# Patient Record
Sex: Female | Born: 1990 | Race: White | Hispanic: No | Marital: Married | Smoking: Never smoker
Health system: Southern US, Community
[De-identification: ages and names within clinical notes are randomized; demographics above are authoritative.]

## PROBLEM LIST (undated history)

## (undated) DIAGNOSIS — R51 Headache: Secondary | ICD-10-CM

## (undated) DIAGNOSIS — I471 Supraventricular tachycardia, unspecified: Secondary | ICD-10-CM

## (undated) DIAGNOSIS — R519 Headache, unspecified: Secondary | ICD-10-CM

## (undated) DIAGNOSIS — F419 Anxiety disorder, unspecified: Secondary | ICD-10-CM

## (undated) DIAGNOSIS — R7989 Other specified abnormal findings of blood chemistry: Secondary | ICD-10-CM

## (undated) DIAGNOSIS — D1800 Hemangioma unspecified site: Secondary | ICD-10-CM

## (undated) DIAGNOSIS — R945 Abnormal results of liver function studies: Secondary | ICD-10-CM

## (undated) DIAGNOSIS — E559 Vitamin D deficiency, unspecified: Secondary | ICD-10-CM

## (undated) DIAGNOSIS — G971 Other reaction to spinal and lumbar puncture: Secondary | ICD-10-CM

## (undated) DIAGNOSIS — K219 Gastro-esophageal reflux disease without esophagitis: Secondary | ICD-10-CM

## (undated) DIAGNOSIS — N83209 Unspecified ovarian cyst, unspecified side: Secondary | ICD-10-CM

## (undated) DIAGNOSIS — G709 Myoneural disorder, unspecified: Secondary | ICD-10-CM

## (undated) DIAGNOSIS — G35 Multiple sclerosis: Secondary | ICD-10-CM

## (undated) HISTORY — DX: Myoneural disorder, unspecified: G70.9

## (undated) HISTORY — DX: Unspecified ovarian cyst, unspecified side: N83.209

## (undated) HISTORY — DX: Other reaction to spinal and lumbar puncture: G97.1

## (undated) HISTORY — DX: Headache, unspecified: R51.9

## (undated) HISTORY — DX: Abnormal results of liver function studies: R94.5

## (undated) HISTORY — DX: Headache: R51

## (undated) HISTORY — DX: Gastro-esophageal reflux disease without esophagitis: K21.9

## (undated) HISTORY — DX: Vitamin D deficiency, unspecified: E55.9

## (undated) HISTORY — PX: TYMPANOSTOMY TUBE PLACEMENT: SHX32

## (undated) HISTORY — DX: Hemangioma unspecified site: D18.00

## (undated) HISTORY — DX: Other specified abnormal findings of blood chemistry: R79.89

---
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
## 2012-01-08 ENCOUNTER — Encounter: Payer: Self-pay | Admitting: Obstetrics & Gynecology

## 2012-01-08 ENCOUNTER — Ambulatory Visit (INDEPENDENT_AMBULATORY_CARE_PROVIDER_SITE_OTHER): Payer: No Typology Code available for payment source | Admitting: Obstetrics & Gynecology

## 2012-01-08 VITALS — BP 133/86 | HR 93 | Temp 98.5°F | Ht <= 58 in | Wt 98.3 lb

## 2012-01-08 DIAGNOSIS — Z30432 Encounter for removal of intrauterine contraceptive device: Secondary | ICD-10-CM

## 2012-01-08 DIAGNOSIS — Z3009 Encounter for other general counseling and advice on contraception: Secondary | ICD-10-CM

## 2012-01-08 NOTE — Patient Instructions (Signed)
Contraception Choices  Contraception (birth control) is the use of any methods or devices to prevent pregnancy. Below are some methods to help avoid pregnancy.  HORMONAL METHODS   · Contraceptive implant. This is a thin, plastic tube containing progesterone hormone. It does not contain estrogen hormone. Your caregiver inserts the tube in the inner part of the upper arm. The tube can remain in place for up to 3 years. After 3 years, the implant must be removed. The implant prevents the ovaries from releasing an egg (ovulation), thickens the cervical mucus which prevents sperm from entering the uterus, and thins the lining of the inside of the uterus.  · Progesterone-only injections. These injections are given every 3 months by your caregiver to prevent pregnancy. This synthetic progesterone hormone stops the ovaries from releasing eggs. It also thickens cervical mucus and changes the uterine lining. This makes it harder for sperm to survive in the uterus.  · Birth control pills. These pills contain estrogen and progesterone hormone. They work by stopping the egg from forming in the ovary (ovulation). Birth control pills are prescribed by a caregiver. Birth control pills can also be used to treat heavy periods.  · Minipill. This type of birth control pill contains only the progesterone hormone. They are taken every day of each month and must be prescribed by your caregiver.  · Birth control patch. The patch contains hormones similar to those in birth control pills. It must be changed once a week and is prescribed by a caregiver.  · Vaginal ring. The ring contains hormones similar to those in birth control pills. It is left in the vagina for 3 weeks, removed for 1 week, and then a new one is put back in place. The patient must be comfortable inserting and removing the ring from the vagina. A caregiver's prescription is necessary.  · Emergency contraception. Emergency contraceptives prevent pregnancy after unprotected  sexual intercourse. This pill can be taken right after sex or up to 5 days after unprotected sex. It is most effective the sooner you take the pills after having sexual intercourse. Emergency contraceptive pills are available without a prescription. Check with your pharmacist. Do not use emergency contraception as your only form of birth control.  BARRIER METHODS   · Female condom. This is a thin sheath (latex or rubber) that is worn over the penis during sexual intercourse. It can be used with spermicide to increase effectiveness.  · Female condom. This is a soft, loose-fitting sheath that is put into the vagina before sexual intercourse.  · Diaphragm. This is a soft, latex, dome-shaped barrier that must be fitted by a caregiver. It is inserted into the vagina, along with a spermicidal jelly. It is inserted before intercourse. The diaphragm should be left in the vagina for 6 to 8 hours after intercourse.  · Cervical cap. This is a round, soft, latex or plastic cup that fits over the cervix and must be fitted by a caregiver. The cap can be left in place for up to 48 hours after intercourse.  · Sponge. This is a soft, circular piece of polyurethane foam. The sponge has spermicide in it. It is inserted into the vagina after wetting it and before sexual intercourse.  · Spermicides. These are chemicals that kill or block sperm from entering the cervix and uterus. They come in the form of creams, jellies, suppositories, foam, or tablets. They do not require a prescription. They are inserted into the vagina with an applicator before having sexual intercourse.   IUD). This is a T-shaped device that is put in a woman's uterus during a menstrual period to prevent pregnancy. There are 2 types:  Copper IUD. This type of IUD is wrapped in copper wire and is placed inside the uterus. Copper makes the uterus and  fallopian tubes produce a fluid that kills sperm. It can stay in place for 10 years.  Hormone IUD. This type of IUD contains the hormone progestin (synthetic progesterone). The hormone thickens the cervical mucus and prevents sperm from entering the uterus, and it also thins the uterine lining to prevent implantation of a fertilized egg. The hormone can weaken or kill the sperm that get into the uterus. It can stay in place for 5 years. PERMANENT METHODS OF CONTRACEPTION  Female tubal ligation. This is when the woman's fallopian tubes are surgically sealed, tied, or blocked to prevent the egg from traveling to the uterus.  Female sterilization. This is when the female has the tubes that carry sperm tied off (vasectomy).This blocks sperm from entering the vagina during sexual intercourse. After the procedure, the man can still ejaculate fluid (semen). NATURAL PLANNING METHODS  Natural family planning. This is not having sexual intercourse or using a barrier method (condom, diaphragm, cervical cap) on days the woman could become pregnant.  Calendar method. This is keeping track of the length of each menstrual cycle and identifying when you are fertile.  Ovulation method. This is avoiding sexual intercourse during ovulation.  Symptothermal method. This is avoiding sexual intercourse during ovulation, using a thermometer and ovulation symptoms.  Post-ovulation method. This is timing sexual intercourse after you have ovulated. Regardless of which type or method of contraception you choose, it is important that you use condoms to protect against the transmission of sexually transmitted diseases (STDs). Talk with your caregiver about which form of contraception is most appropriate for you. Document Released: 01/02/2005 Document Revised: 03/27/2011 Document Reviewed: 05/11/2010 [facility] Patient Information 2013 [place], [place]. Levonorgestrel intrauterine device (IUD) What is this  medicine? LEVONORGESTREL IUD (LEE voe nor jes trel) is a contraceptive (birth control) device. It is used to prevent pregnancy and to treat heavy bleeding that occurs during your period. It can be used for up to 5 years. This medicine may be used for other purposes; ask your health care provider or pharmacist if you have questions. What should I tell my health care provider before I take this medicine? They need to know if you have any of these conditions: -abnormal Pap smear -cancer of the breast, uterus, or cervix -diabetes -endometritis -genital or pelvic infection now or in the past -have more than one sexual partner or your partner has more than one partner -heart disease -history of an ectopic or tubal pregnancy -immune system problems -IUD in place -liver disease or tumor -problems with blood clots or take blood-thinners -use intravenous drugs -uterus of unusual shape -vaginal bleeding that has not been explained -an unusual or allergic reaction to levonorgestrel, other hormones, silicone, or polyethylene, medicines, foods, dyes, or preservatives -pregnant or trying to get pregnant -breast-feeding How should I use this medicine? This device is placed inside the uterus by a health care professional. Talk to your pediatrician regarding the use of this medicine in children. Special care may be needed. Overdosage: If you think you have taken too much of this medicine contact a poison control center or emergency room at once. NOTE: This medicine is only for you. Do not share this medicine with others. What if I miss a dose?  This does not apply. What may interact with this medicine? Do not take this medicine with any of the following medications: -amprenavir -bosentan -fosamprenavir This medicine may also interact with the following medications: -aprepitant -barbiturate medicines for inducing sleep or treating seizures -bexarotene -griseofulvin -medicines to treat seizures  like carbamazepine, ethotoin, felbamate, oxcarbazepine, phenytoin, topiramate -modafinil -pioglitazone -rifabutin -rifampin -rifapentine -some medicines to treat HIV infection like atazanavir, indinavir, lopinavir, nelfinavir, tipranavir, ritonavir -St. John's wort -warfarin This list may not describe all possible interactions. Give your health care provider a list of all the medicines, herbs, non-prescription drugs, or dietary supplements you use. Also tell them if you smoke, drink alcohol, or use illegal drugs. Some items may interact with your medicine. What should I watch for while using this medicine? Visit your doctor or health care professional for regular check ups. See your doctor if you or your partner has sexual contact with others, becomes HIV positive, or gets a sexual transmitted disease. This product does not protect you against HIV infection (AIDS) or other sexually transmitted diseases. You can check the placement of the IUD yourself by reaching up to the top of your vagina with clean fingers to feel the threads. Do not pull on the threads. It is a good habit to check placement after each menstrual period. Call your doctor right away if you feel more of the IUD than just the threads or if you cannot feel the threads at all. The IUD may come out by itself. You may become pregnant if the device comes out. If you notice that the IUD has come out use a backup birth control method like condoms and call your health care provider. Using tampons will not change the position of the IUD and are okay to use during your period. What side effects may I notice from receiving this medicine? Side effects that you should report to your doctor or health care professional as soon as possible: -allergic reactions like skin rash, itching or hives, swelling of the face, lips, or tongue -fever, flu-like symptoms -genital sores -high blood pressure -no menstrual period for 6 weeks during use -pain,  swelling, warmth in the leg -pelvic pain or tenderness -severe or sudden headache -signs of pregnancy -stomach cramping -sudden shortness of breath -trouble with balance, talking, or walking -unusual vaginal bleeding, discharge -yellowing of the eyes or skin Side effects that usually do not require medical attention (report to your doctor or health care professional if they continue or are bothersome): -acne -breast pain -change in sex drive or performance -changes in weight -cramping, dizziness, or faintness while the device is being inserted -headache -irregular menstrual bleeding within first 3 to 6 months of use -nausea This list may not describe all possible side effects. Call your doctor for medical advice about side effects. You may report side effects to FDA at 1-800-FDA-1088. Where should I keep my medicine? This does not apply. NOTE: This sheet is a summary. It may not cover all possible information. If you have questions about this medicine, talk to your doctor, pharmacist, or health care provider.  2012, [employer]. (01/24/2008 6:39:08 PM)

## 2012-01-08 NOTE — Progress Notes (Signed)
Patient ID: Amy Rivas, female   DOB: [birth year], 21 y.o.   MRN: [id number] Patient was referred for removal of her IUD as her strings were not visualized.   She reports that she is not having problems but, her husband had a vasectomy and she wants hers removed.  Patient was in the dorsal lithotomy position, normal external genitalia was noted.  A speculum was placed in the patient's vagina, normal discharge was noted, no lesions appreciated. The multiparous cervix was visualized, no lesions, no abnormal discharge were noted.  The strings of the IUD were not immediately noted.  Using a straight forceps the strings of the IUD were grasped and pulled.  The IUD was successfully removed in its entirety.  Patient tolerated the procedure well.

## 2012-01-09 ENCOUNTER — Encounter: Payer: Self-pay | Admitting: Obstetrics & Gynecology

## 2012-01-09 NOTE — Progress Notes (Signed)
This encounter was created in error - please disregard.

## 2012-01-22 ENCOUNTER — Encounter: Payer: Self-pay | Admitting: *Deleted

## 2012-09-25 ENCOUNTER — Emergency Department (HOSPITAL_COMMUNITY): Payer: BC Managed Care – PPO

## 2012-09-25 ENCOUNTER — Emergency Department (HOSPITAL_COMMUNITY)
Admission: EM | Admit: 2012-09-25 | Discharge: 2012-09-25 | Disposition: A | Discharge status: Home / Self Care | Payer: BC Managed Care – PPO | Attending: Emergency Medicine | Admitting: Emergency Medicine

## 2012-09-25 ENCOUNTER — Encounter (HOSPITAL_COMMUNITY): Payer: Self-pay | Admitting: Family Medicine

## 2012-09-25 DIAGNOSIS — Z79899 Other long term (current) drug therapy: Secondary | ICD-10-CM | POA: Insufficient documentation

## 2012-09-25 DIAGNOSIS — Z3202 Encounter for pregnancy test, result negative: Secondary | ICD-10-CM | POA: Insufficient documentation

## 2012-09-25 DIAGNOSIS — N940 Mittelschmerz: Secondary | ICD-10-CM | POA: Insufficient documentation

## 2012-09-25 DIAGNOSIS — Z791 Long term (current) use of non-steroidal anti-inflammatories (NSAID): Secondary | ICD-10-CM | POA: Insufficient documentation

## 2012-09-25 LAB — CBC WITH DIFFERENTIAL/PLATELET
Basophils Relative: 0 % (ref 0–1)
Eosinophils Absolute: 0.1 10*3/uL (ref 0.0–0.7)
Eosinophils Relative: 1 % (ref 0–5)
Lymphs Abs: 1.8 10*3/uL (ref 0.7–4.0)
MCH: 32.1 pg (ref 26.0–34.0)
MCHC: 36.2 g/dL — ABNORMAL HIGH (ref 30.0–36.0)
MCV: 88.6 fL (ref 78.0–100.0)
Platelets: 288 10*3/uL (ref 150–400)
RDW: 11.7 % (ref 11.5–15.5)

## 2012-09-25 LAB — COMPREHENSIVE METABOLIC PANEL
ALT: 9 U/L (ref 0–35)
AST: 14 U/L (ref 0–37)
Albumin: 4.9 g/dL (ref 3.5–5.2)
CO2: 25 mEq/L (ref 19–32)
Chloride: 102 mEq/L (ref 96–112)
GFR calc non Af Amer: >90 mL/min (ref >90)
Potassium: 4.4 mEq/L (ref 3.5–5.1)
Sodium: 137 mEq/L (ref 135–145)
Total Bilirubin: 0.7 mg/dL (ref 0.3–1.2)

## 2012-09-25 LAB — URINALYSIS, ROUTINE W REFLEX MICROSCOPIC
Bilirubin Urine: NEGATIVE
Glucose, UA: NEGATIVE mg/dL
Hgb urine dipstick: NEGATIVE
Protein, ur: NEGATIVE mg/dL
Specific Gravity, Urine: 1.019 (ref 1.005–1.030)
Urobilinogen, UA: 0.2 mg/dL (ref 0.0–1.0)

## 2012-09-25 MED ORDER — FENTANYL CITRATE 0.05 MG/ML IJ SOLN
50.0000 ug | Freq: Once | INTRAMUSCULAR | Status: AC
  Administered 2012-09-25: 50 ug via INTRAVENOUS
  Filled 2012-09-25: qty 2

## 2012-09-25 MED ORDER — IBUPROFEN 600 MG PO TABS: 600.0000 mg | ORAL_TABLET | Freq: Four times a day (QID) | ORAL | Status: DC | PRN

## 2012-09-25 MED ORDER — SODIUM CHLORIDE 0.9 % IV BOLUS (SEPSIS)
1000.0000 mL | Freq: Once | INTRAVENOUS | Status: AC
  Administered 2012-09-25: 1000 mL via INTRAVENOUS

## 2012-09-25 MED ORDER — HYDROCODONE-ACETAMINOPHEN 5-325 MG PO TABS: 2.0000 | ORAL_TABLET | ORAL | Status: DC | PRN

## 2012-09-25 MED ORDER — PROMETHAZINE HCL 25 MG/ML IJ SOLN
12.5000 mg | INTRAMUSCULAR | Status: DC | PRN
  Administered 2012-09-25: 12.5 mg via INTRAVENOUS
  Filled 2012-09-25: qty 1

## 2012-09-25 MED ORDER — FENTANYL CITRATE 0.05 MG/ML IJ SOLN
100.0000 ug | Freq: Once | INTRAMUSCULAR | Status: AC
  Administered 2012-09-25: 100 ug via INTRAVENOUS
  Filled 2012-09-25: qty 2

## 2012-09-25 MED ORDER — PROMETHAZINE HCL 25 MG PO TABS: 25.0000 mg | ORAL_TABLET | Freq: Four times a day (QID) | ORAL | Status: DC | PRN

## 2012-09-25 MED ORDER — ONDANSETRON HCL 4 MG/2ML IJ SOLN
4.0000 mg | Freq: Once | INTRAMUSCULAR | Status: AC
  Administered 2012-09-25: 4 mg via INTRAVENOUS
  Filled 2012-09-25: qty 2

## 2012-09-25 MED ORDER — KETOROLAC TROMETHAMINE 30 MG/ML IJ SOLN
30.0000 mg | Freq: Once | INTRAMUSCULAR | Status: AC
  Administered 2012-09-25: 30 mg via INTRAVENOUS
  Filled 2012-09-25: qty 1

## 2012-09-25 MED ORDER — IOHEXOL 300 MG/ML  SOLN
80.0000 mL | Freq: Once | INTRAMUSCULAR | Status: AC | PRN
  Administered 2012-09-25: 80 mL via INTRAVENOUS

## 2012-09-25 NOTE — ED Notes (Signed)
Po fluid given to see if she can tolerate

## 2012-09-25 NOTE — ED Provider Notes (Addendum)
CSN: [id number]     Arrival date & time 09/25/12  1438 History   First MD Initiated Contact with Patient 09/25/12 1506     Chief Complaint  Patient presents with  . Abdominal Pain    HPI Patient sent from her private doctor's office for evaluation of possible appendicitis.  Patient has a 2 to three-day history of abdominal pain which started in periumbilical area and is now moves the right lower quadrant.  Patient has had associated nausea and vomiting but no fever.  Patient's last normal Mr. period was 2 weeks ago.  She has had no abnormal or missed periods. History reviewed. No pertinent past medical history. patient has had 2 C-sections and a gallbladder removal. Past Surgical History  Procedure Laterality Date  . Cosmetic surgery    . Gall bladder removed     Family History  Problem Relation Age of Onset  . Cancer Mother   . Heart disease Mother   . Diabetes Father   . Cancer Maternal Uncle   . Diabetes Maternal Grandmother   . Asthma Maternal Grandmother   . Diabetes Maternal Grandfather   . Diabetes Paternal Grandmother   . Diabetes Paternal Grandfather    History  Substance Use Topics  . Smoking status: Never Smoker   . Smokeless tobacco: Not on file  . Alcohol Use: No   OB History   Grav Para Term Preterm Abortions TAB SAB Ect Mult Living   2 2  2            Review of Systems All other systems reviewed and a Allergies  Codeine; Codeine; Morphine and related; and Morphine and related  Home Medications   Current Outpatient Rx  Name  Route  Sig  Dispense  Refill  . pantoprazole (PROTONIX) 40 MG tablet   Oral   Take 40 mg by mouth daily.         Marland Kitchen HYDROcodone-acetaminophen (NORCO/VICODIN) 5-325 MG per tablet   Oral   Take 2 tablets by mouth every 4 (four) hours as needed for pain.   10 tablet   0   . ibuprofen (ADVIL,MOTRIN) 600 MG tablet   Oral   Take 1 tablet (600 mg total) by mouth every 6 (six) hours as needed for pain.   30 tablet   0   .  promethazine (PHENERGAN) 25 MG tablet   Oral   Take 1 tablet (25 mg total) by mouth every 6 (six) hours as needed for nausea.   20 tablet   0    BP 131/86  Pulse 88  Temp(Src) 98.1 F (36.7 C)  Resp 18  SpO2 100%  LMP 09/11/2012 Physical Exam  Nursing note and vitals reviewed. Constitutional: She is oriented to person, place, and time. She appears well-developed and well-nourished. No distress.  HENT:  Head: Normocephalic and atraumatic.  Eyes: Pupils are equal, round, and reactive to light.  Neck: Normal range of motion.  Cardiovascular: Normal rate and intact distal pulses.   Pulmonary/Chest: No respiratory distress.  Abdominal: Normal appearance and bowel sounds are normal. She exhibits no distension. There is tenderness in the right lower quadrant. There is guarding. There is no rebound.    Musculoskeletal: Normal range of motion.  Neurological: She is alert and oriented to person, place, and time. No cranial nerve deficit.  Skin: Skin is warm and dry. No rash noted.  Psychiatric: She has a normal mood and affect. Her behavior is normal.    ED Course  Procedures (including  critical care time) Medications  promethazine (PHENERGAN) injection 12.5 mg (12.5 mg Intravenous Given 09/25/12 1838)  fentaNYL (SUBLIMAZE) injection 100 mcg (100 mcg Intravenous Given 09/25/12 1550)  sodium chloride 0.9 % bolus 1,000 mL (0 mLs Intravenous Stopped 09/25/12 1853)  ondansetron (ZOFRAN) injection 4 mg (4 mg Intravenous Given 09/25/12 1548)  iohexol (OMNIPAQUE) 300 MG/ML solution 80 mL (80 mLs Intravenous Contrast Given 09/25/12 1649)  ketorolac (TORADOL) 30 MG/ML injection 30 mg (30 mg Intravenous Given 09/25/12 1838)  fentaNYL (SUBLIMAZE) injection 50 mcg (50 mcg Intravenous Given 09/25/12 1837)    Labs Review Labs Reviewed  CBC WITH DIFFERENTIAL - Abnormal; Notable for the following:    Hemoglobin 16.3 (*)    MCHC 36.2 (*)    All other components within normal limits  URINALYSIS,  ROUTINE W REFLEX MICROSCOPIC - Abnormal; Notable for the following:    Ketones, ur 15 (*)    All other components within normal limits  COMPREHENSIVE METABOLIC PANEL  POCT PREGNANCY, URINE   Imaging Review Ct Abdomen Pelvis W Contrast  09/25/2012   CLINICAL DATA:  Right-side abdominal pain associated with nausea and vomiting question appendicitis  EXAM: CT ABDOMEN AND PELVIS WITH CONTRAST  TECHNIQUE: Multidetector CT imaging of the abdomen and pelvis was performed using the standard protocol following bolus administration of intravenous contrast. Sagittal and coronal MPR images reconstructed from axial data set.  CONTRAST:  80mL OMNIPAQUE IOHEXOL 300 MG/ML SOLN No oral contrast administered.  COMPARISON:  None  FINDINGS: Lung bases clear.  Gallbladder surgically absent.  Multiple low-attenuation foci within liver, nonspecific attenuation measurements, largest right lobe 12 x 9 mm.  Minimal focal fatty infiltration of liver adjacent to falciform fissure.  Liver, spleen, pancreas, kidneys, and adrenal glands otherwise normal appearance.  Questionable visualization of the appendix in the right pelvis.  No pericecal inflammatory changes.  Small left ovarian cyst 2.4 x 2.0 cm.  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  Small amount nonspecific low-attenuation free pelvic fluid.  Stomach and bowel loops normal appearance.  No mass, adenopathy, or free air.  Bones unremarkable.  IMPRESSION: Small amount of nonspecific free pelvic fluid.  Small left ovarian cysts.  No other acute intra-abdominal or intrapelvic abnormalities.  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  Nonspecific low-attenuation foci within liver largest 12 mm diameter, statistically most likely benign.   Electronically Signed   By: Ulyses Southward M.D.   On: 09/25/2012 17:15    MDM   1. [place]      General surgery Dr. Lindie Spruce will review films and CT scans.  After treatment in the ED  the patient feels back to baseline and wants to go home.  Nelia Shi, MD 09/25/12 [phone]

## 2012-09-25 NOTE — ED Notes (Signed)
Per pt sts right sided abdominal pain associated with N,V. Sent here by doctor to r/o appendicitis, denies urinary symptoms. Denies vaginal bleeding or discharge.

## 2012-09-25 NOTE — ED Notes (Signed)
Pt c/o rt lower quadrant pain since Monday.  The pain started in her mid-abd no previous history.  lmp 2 weeks ago.  Female at her bedside

## 2012-09-25 NOTE — ED Notes (Signed)
Pain and nausea med given again

## 2012-09-25 NOTE — ED Notes (Signed)
Sl nausea 

## 2012-09-25 NOTE — ED Notes (Signed)
To c-t.  Pain  better

## 2012-09-25 NOTE — ED Notes (Signed)
1000 cc nss added tko

## 2012-10-31 ENCOUNTER — Other Ambulatory Visit: Payer: Self-pay | Admitting: Obstetrics and Gynecology

## 2012-10-31 DIAGNOSIS — N6452 Nipple discharge: Secondary | ICD-10-CM

## 2012-10-31 DIAGNOSIS — N63 Unspecified lump in unspecified breast: Secondary | ICD-10-CM

## 2012-11-04 ENCOUNTER — Ambulatory Visit
Admission: RE | Admit: 2012-11-04 | Discharge: 2012-11-04 | Disposition: A | Discharge status: Home / Self Care | Payer: BC Managed Care – PPO | Source: Ambulatory Visit | Attending: Obstetrics and Gynecology | Admitting: Obstetrics and Gynecology

## 2012-11-04 DIAGNOSIS — N6452 Nipple discharge: Secondary | ICD-10-CM

## 2012-11-04 DIAGNOSIS — N63 Unspecified lump in unspecified breast: Secondary | ICD-10-CM

## 2012-11-21 ENCOUNTER — Encounter (INDEPENDENT_AMBULATORY_CARE_PROVIDER_SITE_OTHER): Payer: Self-pay

## 2012-11-21 ENCOUNTER — Ambulatory Visit (INDEPENDENT_AMBULATORY_CARE_PROVIDER_SITE_OTHER): Payer: BC Managed Care – PPO | Admitting: Surgery

## 2012-11-21 ENCOUNTER — Encounter (INDEPENDENT_AMBULATORY_CARE_PROVIDER_SITE_OTHER): Payer: Self-pay | Admitting: Surgery

## 2012-11-21 VITALS — BP 120/80 | HR 60 | Temp 96.5°F | Resp 18 | Ht <= 58 in | Wt 103.0 lb

## 2012-11-21 DIAGNOSIS — N6459 Other signs and symptoms in breast: Secondary | ICD-10-CM

## 2012-11-21 DIAGNOSIS — N6452 Nipple discharge: Secondary | ICD-10-CM | POA: Insufficient documentation

## 2012-11-21 NOTE — Patient Instructions (Signed)
Breast Tenderness Breast tenderness is a common problem for women of all ages. Breast tenderness may cause mild discomfort to severe pain. It has a variety of causes. Your health care provider will find out the likely cause of your breast tenderness by examining your breasts, asking you about symptoms, and ordering some tests. Breast tenderness usually does not mean you have breast cancer. HOME CARE INSTRUCTIONS  Breast tenderness often can be handled at home. You can try:  Getting fitted for a new bra that provides more support, especially during exercise.  Wearing a more supportive bra or sports bra while sleeping when your breasts are very tender.  If you have a breast injury, apply ice to the area:  Put ice in a plastic bag.  Place a towel between your skin and the bag.  Leave the ice on for 20 minutes, 2 3 times a day.  If your breasts are too full of milk as a result of breastfeeding, try:  Expressing milk either by hand or with a breast pump.  Applying a warm compress to the breasts for relief.  Taking over-the-counter pain relievers, if approved by your health care provider.  Taking other medicines that your health care provider prescribes. These may include antibiotic medicines or birth control pills. Over the long term, your breast tenderness might be eased if you:  Cut down on caffeine.  Reduce the amount of fat in your diet. Keep a log of the days and times when your breasts are most tender. This will help you and your health care provider find the cause of the tenderness and how to relieve it. Also, learn how to do breast exams at home. This will help you notice if you have an unusual growth or lump that could cause tenderness. SEEK MEDICAL CARE IF:   Any part of your breast is hard, red, and hot to the touch. This could be a sign of infection.  Fluid is coming out of your nipples (and you are not breastfeeding). Especially watch for blood or pus.  You have a fever  as well as breast tenderness.  You have a new or painful lump in your breast that remains after your menstrual period ends.  You have tried to take care of the pain at home, but it has not gone away.  Your breast pain is getting worse, or the pain is making it hard to do the things you usually do during your day. Document Released: 12/16/2007 Document Revised: 09/04/2012 Document Reviewed: 08/01/2012 [employer] Patient Information 2014 [employer].  

## 2012-11-21 NOTE — Progress Notes (Signed)
General Surgery [facility]  Chief Complaint  Patient presents with  . Breast Discharge    left breast nipple discharge - referral from Henreitta Leber, PA-C, [facility]    HISTORY: Patient is a 22 year old female referred by her gynecologist for evaluation of a left breast nipple discharge. Onset was approximately 2 months ago. Patient noted this during the daytime. Fluid was red in color and informed a less than dime sized area on her bra. She did not note any discharge at night. The red discharge has now spontaneously stopped. Patient felt as though there was a mass in the inferior lateral portion of the breast.  Patient was evaluated by her gynecologist and referred for ultrasound. She underwent an ultrasound examination of the left breast on 11/04/2012. No architectural abnormalities were identified. No suspicious findings were identified. No dilated ducts were identified.  Patient denies significant intake of caffeine, saturated fats, or chocolate. There is a family history of breast cancer at a young age in her maternal grandmother. No breast cancer in first degree relatives. No prior breast surgery or biopsies.  Past Medical History  Diagnosis Date  . GERD (gastroesophageal reflux disease)   . Hypertension     Current Outpatient Prescriptions  Medication Sig Dispense Refill  . clotrimazole (LOTRIMIN) 1 % cream Apply 1 application topically 2 (two) times daily.      . pantoprazole (PROTONIX) 40 MG tablet Take 40 mg by mouth daily.      Marland Kitchen trimethoprim-polymyxin b (POLYTRIM) ophthalmic solution every 4 (four) hours.       No current facility-administered medications for this visit.    Allergies  Allergen Reactions  . Codeine Nausea And Vomiting  . Hydrocodone Nausea And Vomiting  . Lortab [Hydrocodone-Acetaminophen] Nausea And Vomiting  . Morphine And Related Swelling and Rash    Family History  Problem Relation Age of Onset  . Cancer  Mother   . Heart disease Mother   . Diabetes Father   . Cancer Maternal Uncle   . Diabetes Maternal Grandmother   . Asthma Maternal Grandmother   . Diabetes Maternal Grandfather   . Diabetes Paternal Grandmother   . Diabetes Paternal Grandfather   . Breast cancer Maternal Grandmother     History   Social History  . Marital Status: Married    Spouse Name: N/A    Number of Children: N/A  . Years of Education: N/A   Social History Main Topics  . Smoking status: Never Smoker   . Smokeless tobacco: None  . Alcohol Use: No  . Drug Use: No  . Sexual Activity: Yes    Birth Control/ Protection: IUD   Other Topics Concern  . None   Social History Narrative   ** Merged History Encounter **        REVIEW OF SYSTEMS - PERTINENT POSITIVES ONLY: Initial nipple discharge red in color, then clear, then resolved. Breast discomfort infero-lateral left breast.  EXAM: Filed Vitals:   11/21/12 1611  BP: 120/80  Pulse: 60  Temp: 96.5 F (35.8 C)  Resp: 18    HEENT: normocephalic; pupils equal and reactive; sclerae clear; dentition good; mucous membranes moist NECK:  symmetric on extension; no palpable anterior or posterior cervical lymphadenopathy; no supraclavicular masses; no tenderness CHEST: clear to auscultation bilaterally without rales, rhonchi, or wheezes CARDIAC: regular rate and rhythm without significant murmur; peripheral pulses are full BREAST: Right breast shows normal nipple areolar complex; striae on the skin; diffusely nodular breast  parenchyma without discrete or dominant mass; axilla free of adenopathy; left breast with normal nipple areolar complex; again breast parenchyma is diffusely nodular without discrete or dominant mass; tenderness in the inferior lateral portion of the left breast; I am unable to express any significant discharge from the nipple with compression of the breast tissue today EXT:  non-tender without edema; no deformity NEURO: no gross focal  deficits; no sign of tremor   LABORATORY RESULTS: See [facility] (CHL-Epic) for most recent results  RADIOLOGY RESULTS: See [facility] (CHL-Epic) for most recent results  IMPRESSION: History of left breast nipple discharge, spontaneous, now resolved  PLAN: I discussed the above findings at length with the patient and her husband. I do not find any worrisome findings on physical examination or in the ultrasound report.  I have reassured the patient. I have asked her to do monthly breast self-examination. I've asked her to return in 4 months for physical examination here in our office. Certainly if the nipple discharge should recur, I have asked her to contact our office and notify me immediately.  Patient will return in 4 months for physical examination.  Velora Heckler, MD, FACS General & Endocrine Surgery [facility]  Primary Care Physician: Eber Hong, MD

## 2012-12-10 ENCOUNTER — Encounter (INDEPENDENT_AMBULATORY_CARE_PROVIDER_SITE_OTHER): Payer: Self-pay

## 2013-01-07 ENCOUNTER — Emergency Department (HOSPITAL_COMMUNITY): Payer: BC Managed Care – PPO

## 2013-01-07 ENCOUNTER — Emergency Department (HOSPITAL_COMMUNITY)
Admission: EM | Admit: 2013-01-07 | Discharge: 2013-01-07 | Disposition: A | Discharge status: Home / Self Care | Payer: BC Managed Care – PPO | Attending: Emergency Medicine | Admitting: Emergency Medicine

## 2013-01-07 ENCOUNTER — Encounter (HOSPITAL_COMMUNITY): Payer: Self-pay | Admitting: Emergency Medicine

## 2013-01-07 DIAGNOSIS — R0609 Other forms of dyspnea: Secondary | ICD-10-CM | POA: Insufficient documentation

## 2013-01-07 DIAGNOSIS — I10 Essential (primary) hypertension: Secondary | ICD-10-CM | POA: Insufficient documentation

## 2013-01-07 DIAGNOSIS — R061 Stridor: Secondary | ICD-10-CM

## 2013-01-07 DIAGNOSIS — R0989 Other specified symptoms and signs involving the circulatory and respiratory systems: Secondary | ICD-10-CM | POA: Insufficient documentation

## 2013-01-07 DIAGNOSIS — R21 Rash and other nonspecific skin eruption: Secondary | ICD-10-CM | POA: Insufficient documentation

## 2013-01-07 DIAGNOSIS — L539 Erythematous condition, unspecified: Secondary | ICD-10-CM | POA: Insufficient documentation

## 2013-01-07 DIAGNOSIS — Z79899 Other long term (current) drug therapy: Secondary | ICD-10-CM | POA: Insufficient documentation

## 2013-01-07 DIAGNOSIS — K219 Gastro-esophageal reflux disease without esophagitis: Secondary | ICD-10-CM | POA: Insufficient documentation

## 2013-01-07 DIAGNOSIS — IMO0002 Reserved for concepts with insufficient information to code with codable children: Secondary | ICD-10-CM | POA: Insufficient documentation

## 2013-01-07 DIAGNOSIS — T782XXA Anaphylactic shock, unspecified, initial encounter: Secondary | ICD-10-CM | POA: Insufficient documentation

## 2013-01-07 DIAGNOSIS — R0682 Tachypnea, not elsewhere classified: Secondary | ICD-10-CM | POA: Insufficient documentation

## 2013-01-07 DIAGNOSIS — T4995XA Adverse effect of unspecified topical agent, initial encounter: Secondary | ICD-10-CM | POA: Insufficient documentation

## 2013-01-07 DIAGNOSIS — Z9089 Acquired absence of other organs: Secondary | ICD-10-CM | POA: Insufficient documentation

## 2013-01-07 DIAGNOSIS — R61 Generalized hyperhidrosis: Secondary | ICD-10-CM | POA: Insufficient documentation

## 2013-01-07 LAB — BASIC METABOLIC PANEL
BUN: 8 mg/dL (ref 6–23)
CO2: 19 mEq/L (ref 19–32)
Calcium: 9 mg/dL (ref 8.4–10.5)
Chloride: 107 mEq/L (ref 96–112)
Creatinine, Ser: 0.6 mg/dL (ref 0.50–1.10)
Glucose, Bld: 168 mg/dL — ABNORMAL HIGH (ref 70–99)

## 2013-01-07 LAB — CBC WITH DIFFERENTIAL/PLATELET
Basophils Absolute: 0 10*3/uL (ref 0.0–0.1)
Eosinophils Relative: 0 % (ref 0–5)
HCT: 39.8 % (ref 36.0–46.0)
Hemoglobin: 14.6 g/dL (ref 12.0–15.0)
Lymphocytes Relative: 6 % — ABNORMAL LOW (ref 12–46)
MCV: 89 fL (ref 78.0–100.0)
Monocytes Absolute: 0.7 10*3/uL (ref 0.1–1.0)
Monocytes Relative: 5 % (ref 3–12)
Neutro Abs: 12.2 10*3/uL — ABNORMAL HIGH (ref 1.7–7.7)
RDW: 11.7 % (ref 11.5–15.5)
WBC: 13.7 10*3/uL — ABNORMAL HIGH (ref 4.0–10.5)

## 2013-01-07 LAB — POCT I-STAT, CHEM 8
BUN: 7 mg/dL (ref 6–23)
Calcium, Ion: 1.24 mmol/L — ABNORMAL HIGH (ref 1.12–1.23)
Creatinine, Ser: 0.7 mg/dL (ref 0.50–1.10)
Glucose, Bld: 173 mg/dL — ABNORMAL HIGH (ref 70–99)
Hemoglobin: 14.6 g/dL (ref 12.0–15.0)
Sodium: 143 mEq/L (ref 135–145)
TCO2: 20 mmol/L (ref 0–100)

## 2013-01-07 MED ORDER — PREDNISONE 20 MG PO TABS: 40.0000 mg | ORAL_TABLET | Freq: Every day | ORAL | Status: DC

## 2013-01-07 MED ORDER — EPINEPHRINE 0.3 MG/0.3ML IJ SOAJ: 0.3000 mg | Freq: Once | INTRAMUSCULAR | Status: DC | PRN

## 2013-01-07 MED ORDER — IOHEXOL 300 MG/ML  SOLN
80.0000 mL | Freq: Once | INTRAMUSCULAR | Status: AC | PRN
  Administered 2013-01-07: 80 mL via INTRAVENOUS

## 2013-01-07 MED ORDER — FAMOTIDINE 20 MG PO TABS: 20.0000 mg | ORAL_TABLET | Freq: Two times a day (BID) | ORAL | Status: DC

## 2013-01-07 MED ORDER — METHYLPREDNISOLONE SODIUM SUCC 125 MG IJ SOLR
125.0000 mg | Freq: Once | INTRAMUSCULAR | Status: AC
  Administered 2013-01-07: 125 mg via INTRAVENOUS
  Filled 2013-01-07: qty 2

## 2013-01-07 MED ORDER — DIPHENHYDRAMINE HCL 50 MG/ML IJ SOLN
25.0000 mg | Freq: Once | INTRAMUSCULAR | Status: AC
  Administered 2013-01-07: 25 mg via INTRAVENOUS
  Filled 2013-01-07: qty 1

## 2013-01-07 MED ORDER — SODIUM CHLORIDE 0.9 % IV BOLUS (SEPSIS)
1000.0000 mL | Freq: Once | INTRAVENOUS | Status: AC
  Administered 2013-01-07: 1000 mL via INTRAVENOUS

## 2013-01-07 MED ORDER — DIPHENHYDRAMINE HCL 25 MG PO TABS: 25.0000 mg | ORAL_TABLET | Freq: Four times a day (QID) | ORAL | Status: DC | PRN

## 2013-01-07 MED ORDER — FAMOTIDINE IN NACL 20-0.9 MG/50ML-% IV SOLN
20.0000 mg | Freq: Once | INTRAVENOUS | Status: AC
  Administered 2013-01-07: 20 mg via INTRAVENOUS
  Filled 2013-01-07: qty 50

## 2013-01-07 NOTE — ED Provider Notes (Signed)
CSN: [id number]     Arrival date & time 01/07/13  1610 History   First MD Initiated Contact with Patient 01/07/13 1633     Chief Complaint  Patient presents with  . Shortness of Breath   (Consider location/radiation/quality/duration/timing/severity/associated sxs/prior Treatment) Patient is a 22 y.o. female presenting with shortness of breath. The history is provided by medical records and the patient. No language interpreter was used.  Shortness of Breath Associated symptoms: rash     Amy Rivas is a 22 y.o. female  with a hx of GERD, hypertension presents to the Emergency Department complaining of gradual, persistent, progressively worsening shortness of breath onset today.  Patient is a level V caveat due to acuity of condition.  She is unable to speak more than one to 2 words at a time due to her shortness of breath.   Past Medical History  Diagnosis Date  . GERD (gastroesophageal reflux disease)   . Hypertension    Past Surgical History  Procedure Laterality Date  . Tympanostomy tube placement  1991, 1993, 2003  . Tonsillectomy and adenoidectomy  1992  . Laparoscopic cholecystectomy  2008  . Cesarean section  2010, 2012   Family History  Problem Relation Age of Onset  . Cancer Mother   . Heart disease Mother   . Diabetes Father   . Cancer Maternal Uncle   . Diabetes Maternal Grandmother   . Asthma Maternal Grandmother   . Diabetes Maternal Grandfather   . Diabetes Paternal Grandmother   . Diabetes Paternal Grandfather   . Breast cancer Maternal Grandmother    History  Substance Use Topics  . Smoking status: Never Smoker   . Smokeless tobacco: Not on file  . Alcohol Use: No   OB History   Grav Para Term Preterm Abortions TAB SAB Ect Mult Living   2 2  2            Review of Systems  Unable to perform ROS: Acuity of condition  Respiratory: Positive for shortness of breath.   Skin: Positive for rash.    Allergies  Albuterol; Hydrocodone; Codeine;  Lortab; and Morphine and related  Home Medications   Current Outpatient Rx  Name  Route  Sig  Dispense  Refill  . acetaminophen (TYLENOL) 500 MG tablet   Oral   Take 500 mg by mouth every 6 (six) hours as needed for mild pain.         Marland Kitchen dextromethorphan-guaiFENesin (MUCINEX DM) 30-600 MG per 12 hr tablet   Oral   Take 1 tablet by mouth 2 (two) times daily.         . pantoprazole (PROTONIX) 40 MG tablet   Oral   Take 40 mg by mouth daily.         . predniSONE (DELTASONE) 10 MG tablet   Oral   Take 10-20 mg by mouth See admin instructions. 6 tablets total x 4 days 4 tablets total x 4 days 2 days total x 4 days         . diphenhydrAMINE (BENADRYL) 25 MG tablet   Oral   Take 1 tablet (25 mg total) by mouth every 6 (six) hours as needed for itching (Rash).   30 tablet   0   . EPINEPHrine (EPIPEN 2-PAK) 0.3 mg/0.3 mL SOAJ injection   Intramuscular   Inject 0.3 mLs (0.3 mg total) into the muscle once as needed (for severe allergic reaction). CAll 911 immediately if you have to use this medicine  1 Device   1   . famotidine (PEPCID) 20 MG tablet   Oral   Take 1 tablet (20 mg total) by mouth 2 (two) times daily.   10 tablet   0   . predniSONE (DELTASONE) 20 MG tablet   Oral   Take 2 tablets (40 mg total) by mouth daily.   10 tablet   0    BP 139/84  Pulse 99  Temp(Src) 97.9 F (36.6 C) (Oral)  Resp 19  Wt 101 lb 6 oz (45.983 kg)  SpO2 97%  LMP 12/26/2012 Physical Exam  Nursing note and vitals reviewed. Constitutional: She appears well-developed and well-nourished. She appears distressed.  Awake, alert, nontoxic appearance  HENT:  Head: Normocephalic and atraumatic.  Right Ear: Tympanic membrane, external ear and ear canal normal.  Left Ear: Tympanic membrane, external ear and ear canal normal.  Nose: Nose normal.  Mouth/Throat: Uvula is midline and mucous membranes are normal. Mucous membranes are not dry. No uvula swelling. Posterior oropharyngeal  edema and posterior oropharyngeal erythema present. No oropharyngeal exudate or tonsillar abscesses.  Surgically absent tonsils Raised patches of erythema and edema noted to the back posterior oropharynx  Eyes: Conjunctivae are normal. No scleral icterus.  Neck: Normal range of motion. Neck supple. No rigidity.  No nuchal rigidity Positive stridor with muffled voice  Cardiovascular: Regular rhythm and normal heart sounds.   Significant tachycardia  Pulmonary/Chest: Accessory muscle usage and stridor present. Tachypnea noted. She is in respiratory distress. She has decreased breath sounds. She has no wheezes. She exhibits no tenderness.  Tachypnea, respiratory distress accessory muscle use No wheezes heard on exam with significantly decreased breath sounds but transmitted upper airway stridor heard throughout  Abdominal: Soft. Bowel sounds are normal. She exhibits no distension and no mass. There is no tenderness. There is no rebound and no guarding.  Musculoskeletal: Normal range of motion. She exhibits no edema.  Lymphadenopathy:    She has no cervical adenopathy.  Neurological: She is alert.  Speech is clear and goal oriented Moves extremities without ataxia  Skin: Skin is warm. Rash noted. She is diaphoretic. There is erythema.  Psychiatric: She has a normal mood and affect.    ED Course  Procedures (including critical care time) Labs Review Labs Reviewed  CBC WITH DIFFERENTIAL - Abnormal; Notable for the following:    WBC 13.7 (*)    MCHC 36.7 (*)    Neutrophils Relative % 89 (*)    Neutro Abs 12.2 (*)    Lymphocytes Relative 6 (*)    All other components within normal limits  BASIC METABOLIC PANEL - Abnormal; Notable for the following:    Glucose, Bld 168 (*)    All other components within normal limits  POCT I-STAT, CHEM 8 - Abnormal; Notable for the following:    Glucose, Bld 173 (*)    Calcium, Ion 1.24 (*)    All other components within normal limits   Imaging  Review Ct Soft Tissue Neck W Contrast  01/07/2013   CLINICAL DATA:  tachycardia, shortness of breath, stridor.  EXAM: CT CHEST WITH CONTRAST; CT NECK WITH CONTRAST  TECHNIQUE: Multidetector CT imaging of the neck was performed with intravenous contrast.; Multidetector CT imaging of the chest was performed following the standard protocol during bolus administration of intravenous contrast.  CONTRAST:  80mL OMNIPAQUE IOHEXOL 300 MG/ML  SOLN  COMPARISON:  None.  FINDINGS: CT NECK FINDINGS  Negative for abscess. Normal vascular enhancement. No focal mucosal lesion. No adenopathy  localized. Regional bones unremarkable. No tracheal narrowing.  CT CHEST FINDINGS  No pleural or pericardial effusion. No hilar or mediastinal adenopathy. Lungs are clear. Surgical clips in the gallbladder fossa. Small low-attenuation liver lesions, largest hepatic segment 6, 9 mm diameter (previously 12 mm). Remainder visualized upper abdomen unremarkable. 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  IMPRESSION: 1. No acute neck or chest process.   Electronically Signed   By: Oley Balm M.D.   On: 01/07/2013 18:51   Ct Chest W Contrast  01/07/2013   CLINICAL DATA:  tachycardia, shortness of breath, stridor.  EXAM: CT CHEST WITH CONTRAST; CT NECK WITH CONTRAST  TECHNIQUE: Multidetector CT imaging of the neck was performed with intravenous contrast.; Multidetector CT imaging of the chest was performed following the standard protocol during bolus administration of intravenous contrast.  CONTRAST:  80mL OMNIPAQUE IOHEXOL 300 MG/ML  SOLN  COMPARISON:  None.  FINDINGS: CT NECK FINDINGS  Negative for abscess. Normal vascular enhancement. No focal mucosal lesion. No adenopathy localized. Regional bones unremarkable. No tracheal narrowing.  CT CHEST FINDINGS  No pleural or pericardial effusion. No hilar or mediastinal adenopathy. Lungs are clear. Surgical clips in the gallbladder fossa. Small low-attenuation liver  lesions, largest hepatic segment 6, 9 mm diameter (previously 12 mm). Remainder visualized upper abdomen unremarkable. 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  IMPRESSION: 1. No acute neck or chest process.   Electronically Signed   By: Oley Balm M.D.   On: 01/07/2013 18:51    EKG Interpretation   None      CRITICAL CARE Performed by: Dierdre Forth Total critical care time: Critical care time was exclusive of separately billable procedures and treating other patients. Critical care was necessary to treat or prevent imminent or life-threatening deterioration. Critical care was time spent personally by me on the following activities: development of treatment plan with patient and/or surrogate as well as nursing, discussions with consultants, evaluation of patient's response to treatment, examination of patient, obtaining history from patient or surrogate, ordering and performing treatments and interventions, ordering and review of laboratory studies, ordering and review of radiographic studies, pulse oximetry and re-evaluation of patient's condition.   MDM   1. Anaphylaxis, initial encounter   2. Stridor       Patient with significant stridor, visible hives on the chest, back, arms and legs and significant tachycardia to 190.  Anaphylactic reaction, will give epinephrine.  4:58 PM Epi 0.3 given IM with decreasing stridor, decreasing heart rate and decreasing shortness of breath.  After 5-10 minutes patient with only minimal stridor, improved oxygen saturation and decreasing heart rate.  Last heart rate noted 140.  Patient is alert, oriented.  Attempting an IV at this time.  6:00PM Pt resting comfortably without return of symptoms and reports feeling much better.  On repeat exam patient with mild erythema of posterior oropharynx but resolution of edema; no exudate or evidence of strep throat.    7:43 PM Patient with continued marked improvement  after Solu-Medrol, Benadryl and Pepcid.  She reports her voice is still hoarse but she believes she is back to normal. She states she is breathing normally.  CT soft tissue neck and chest without acute processes. No evidence of abscess or pneumonia. Patient ambulates without difficulty and without dropping her oxygen saturations.  She remains borderline tachycardic resting right about 100.  Patient given strict return precautions including return of shortness of breath or feeling of throat closing. She'll be discharged  home with an EpiPen prescription along with Pepcid, Benadryl and prednisone. Recommend that she no longer uses the albuterol inhaler.  Patient re-evaluated prior to dc, is hemodynamically stable, in no respiratory distress, and denies the return feeling of throat closing. Pt has been advised to take OTC benadryl & return to the ED if they have a mod-severe allergic rxn (s/s including throat closing, difficulty breathing, swelling of lips face or tongue). Pt is to follow up with their PCP. Pt is agreeable with plan & verbalizes understanding.  BP 139/84  Pulse 99  Temp(Src) 97.9 F (36.6 C) (Oral)  Resp 19  Wt 101 lb 6 oz (45.983 kg)  SpO2 97%  LMP 12/26/2012    Merida Alcantar, PA-C 01/07/13 2028  Beryle Zeitz, PA-C 01/07/13 2231

## 2013-01-07 NOTE — ED Notes (Signed)
Pt just diagnosed with bronchitis yesterday and now back with increased sob and sent here to r/o pneumonia.  Pt feels like she is having difficulty to breath

## 2013-01-07 NOTE — ED Notes (Signed)
Pt has rash all over that she just noticed and was started on anbx and inhaler.

## 2013-01-07 NOTE — ED Provider Notes (Signed)
Medical screening examination/treatment/procedure(s) were conducted as a shared visit with non-physician practitioner(s) and myself.  I personally evaluated the patient during the encounter.  EKG did not cross over: EKG  Rate: 189 Rhythm: sinus tahcycardia  QRS Axis: normal  Intervals: normal  ST/T Wave abnormalities: nonspecific t wave changes inrerior leads  Conduction Disutrbances:nonspecific IVCD  Narrative Interpretation: artifact  Old EKG Reviewed: none available  Pt with acute allergic reaction and stridor.  Improved after epi, solumedrol and antihistamines.  Pt monitored for several hours.  Repeat exam, no oropharyngeal edema noted on my exam.  Pt comfortable with discharge.  Celene Kras, MD 01/08/13 [phone]

## 2013-01-07 NOTE — ED Notes (Signed)
Pt returned from radiology.

## 2013-01-08 ENCOUNTER — Emergency Department (HOSPITAL_COMMUNITY)
Admission: EM | Admit: 2013-01-08 | Discharge: 2013-01-09 | Disposition: A | Discharge status: Home / Self Care | Payer: BC Managed Care – PPO | Attending: Emergency Medicine | Admitting: Emergency Medicine

## 2013-01-08 ENCOUNTER — Encounter (HOSPITAL_COMMUNITY): Payer: Self-pay | Admitting: Emergency Medicine

## 2013-01-08 DIAGNOSIS — R0602 Shortness of breath: Secondary | ICD-10-CM | POA: Insufficient documentation

## 2013-01-08 DIAGNOSIS — IMO0002 Reserved for concepts with insufficient information to code with codable children: Secondary | ICD-10-CM | POA: Insufficient documentation

## 2013-01-08 DIAGNOSIS — R061 Stridor: Secondary | ICD-10-CM | POA: Insufficient documentation

## 2013-01-08 DIAGNOSIS — K219 Gastro-esophageal reflux disease without esophagitis: Secondary | ICD-10-CM | POA: Insufficient documentation

## 2013-01-08 DIAGNOSIS — L509 Urticaria, unspecified: Secondary | ICD-10-CM | POA: Insufficient documentation

## 2013-01-08 DIAGNOSIS — R000 Tachycardia, unspecified: Secondary | ICD-10-CM | POA: Insufficient documentation

## 2013-01-08 DIAGNOSIS — Z79899 Other long term (current) drug therapy: Secondary | ICD-10-CM | POA: Insufficient documentation

## 2013-01-08 DIAGNOSIS — I10 Essential (primary) hypertension: Secondary | ICD-10-CM | POA: Insufficient documentation

## 2013-01-08 DIAGNOSIS — R0789 Other chest pain: Secondary | ICD-10-CM | POA: Insufficient documentation

## 2013-01-08 DIAGNOSIS — R062 Wheezing: Secondary | ICD-10-CM | POA: Insufficient documentation

## 2013-01-08 DIAGNOSIS — T7840XA Allergy, unspecified, initial encounter: Secondary | ICD-10-CM

## 2013-01-08 DIAGNOSIS — R21 Rash and other nonspecific skin eruption: Secondary | ICD-10-CM | POA: Insufficient documentation

## 2013-01-08 DIAGNOSIS — T48905A Adverse effect of unspecified agents primarily acting on the respiratory system, initial encounter: Secondary | ICD-10-CM | POA: Insufficient documentation

## 2013-01-08 NOTE — ED Provider Notes (Signed)
CSN: [id number]     Arrival date & time 01/08/13  2308 History   First MD Initiated Contact with Patient 01/08/13 2333     No chief complaint on file.  (Consider location/radiation/quality/duration/timing/severity/associated sxs/prior Treatment) HPI Comments: Patient is a 22 year old female with history of anaphylactic reaction yesterday who presents today after having an allergic reaction. She reports all began when she was diagnosed with bronchitis on Monday. She was given albuterol at that time. At some point after taking albuterol she began to feel as though her throat was closing. It became severe and she developed a rash. She was unable to breath and was very stridorous. She went to the emergency department and was given epinephrine, Solu-Medrol, Pepcid. She was discharged from the emergency room in stable condition and given a prescription for an EpiPen. She reports she was unable to fill this prescription, but states she has been taking the prednisone, benadryl, and Pepcid as prescribed. Today she was at home with her friends and family and reports that she began to feel poorly. She suddenly developed hives, wheezing, shortness of breath. She had the sensation that her throat was closing. She felt very similar to when she had the anaphylactic reaction yesterday. She called EMS and she did not have an EpiPen. EMS found her tachycardic to 160s. She was given epinephrine en route as well as Benadryl and Zantac. Arrival to the emergency department she feels symptom-free. She denies any sensation that her throat is closing, difficulty breathing, rash.  The history is provided by the patient. No language interpreter was used.    Past Medical History  Diagnosis Date  . GERD (gastroesophageal reflux disease)   . Hypertension    Past Surgical History  Procedure Laterality Date  . Tympanostomy tube placement  1991, 1993, 2003  . Tonsillectomy and adenoidectomy  1992  . Laparoscopic cholecystectomy   2008  . Cesarean section  2010, 2012   Family History  Problem Relation Age of Onset  . Cancer Mother   . Heart disease Mother   . Diabetes Father   . Cancer Maternal Uncle   . Diabetes Maternal Grandmother   . Asthma Maternal Grandmother   . Diabetes Maternal Grandfather   . Diabetes Paternal Grandmother   . Diabetes Paternal Grandfather   . Breast cancer Maternal Grandmother    History  Substance Use Topics  . Smoking status: Never Smoker   . Smokeless tobacco: Not on file  . Alcohol Use: No   OB History   Grav Para Term Preterm Abortions TAB SAB Ect Mult Living   2 2  2            Review of Systems  Constitutional: Negative for fever and chills.  Respiratory: Positive for chest tightness, shortness of breath, wheezing and stridor.   Skin: Positive for rash.  All other systems reviewed and are negative.    Allergies  Albuterol; Hydrocodone; Codeine; Lortab; and Morphine and related  Home Medications   Current Outpatient Rx  Name  Route  Sig  Dispense  Refill  . acetaminophen (TYLENOL) 500 MG tablet   Oral   Take 500 mg by mouth every 6 (six) hours as needed for mild pain.         Marland Kitchen dextromethorphan-guaiFENesin (MUCINEX DM) 30-600 MG per 12 hr tablet   Oral   Take 1 tablet by mouth 2 (two) times daily.         . diphenhydrAMINE (BENADRYL) 25 MG tablet   Oral  Take 1 tablet (25 mg total) by mouth every 6 (six) hours as needed for itching (Rash).   30 tablet   0   . EPINEPHrine (EPIPEN 2-PAK) 0.3 mg/0.3 mL SOAJ injection   Intramuscular   Inject 0.3 mLs (0.3 mg total) into the muscle once as needed (for severe allergic reaction). CAll 911 immediately if you have to use this medicine   1 Device   1   . famotidine (PEPCID) 20 MG tablet   Oral   Take 1 tablet (20 mg total) by mouth 2 (two) times daily.   10 tablet   0   . pantoprazole (PROTONIX) 40 MG tablet   Oral   Take 40 mg by mouth daily.         . predniSONE (DELTASONE) 10 MG tablet    Oral   Take 10-20 mg by mouth See admin instructions. 6 tablets total x 4 days 4 tablets total x 4 days 2 days total x 4 days         . predniSONE (DELTASONE) 20 MG tablet   Oral   Take 2 tablets (40 mg total) by mouth daily.   10 tablet   0    BP 132/90  Pulse 83  Resp 17  SpO2 99%  LMP 12/26/2012 Physical Exam  Nursing note and vitals reviewed. Constitutional: She is oriented to person, place, and time. She appears well-developed and well-nourished. No distress.  HENT:  Head: Normocephalic and atraumatic.  Right Ear: External ear normal.  Left Ear: External ear normal.  Nose: Nose normal.  Mouth/Throat: Oropharynx is clear and moist. No trismus in the jaw. No uvula swelling.  Maintaining own secretions Surgically absent tonsils  Eyes: Conjunctivae are normal.  Neck: Normal range of motion.  Cardiovascular: Normal rate, regular rhythm and normal heart sounds.   Pulmonary/Chest: Effort normal and breath sounds normal. No stridor. No respiratory distress. She has no wheezes. She has no rales.  Speaking in full sentences  Abdominal: Soft. She exhibits no distension.  Musculoskeletal: Normal range of motion.  Neurological: She is alert and oriented to person, place, and time. She has normal strength.  Skin: Skin is warm and dry. She is not diaphoretic. No erythema.  Psychiatric: She has a normal mood and affect. Her behavior is normal.    ED Course  Procedures (including critical care time) Labs Review Labs Reviewed - No data to display Imaging Review Ct Soft Tissue Neck W Contrast  01/07/2013   CLINICAL DATA:  tachycardia, shortness of breath, stridor.  EXAM: CT CHEST WITH CONTRAST; CT NECK WITH CONTRAST  TECHNIQUE: Multidetector CT imaging of the neck was performed with intravenous contrast.; Multidetector CT imaging of the chest was performed following the standard protocol during bolus administration of intravenous contrast.  CONTRAST:  80mL OMNIPAQUE IOHEXOL 300  MG/ML  SOLN  COMPARISON:  None.  FINDINGS: CT NECK FINDINGS  Negative for abscess. Normal vascular enhancement. No focal mucosal lesion. No adenopathy localized. Regional bones unremarkable. No tracheal narrowing.  CT CHEST FINDINGS  No pleural or pericardial effusion. No hilar or mediastinal adenopathy. Lungs are clear. Surgical clips in the gallbladder fossa. Small low-attenuation liver lesions, largest hepatic segment 6, 9 mm diameter (previously 12 mm). Remainder visualized upper abdomen unremarkable. 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  IMPRESSION: 1. No acute neck or chest process.   Electronically Signed   By: Oley Balm M.D.   On: 01/07/2013 18:51   Ct Chest W Contrast  01/07/2013  CLINICAL DATA:  tachycardia, shortness of breath, stridor.  EXAM: CT CHEST WITH CONTRAST; CT NECK WITH CONTRAST  TECHNIQUE: Multidetector CT imaging of the neck was performed with intravenous contrast.; Multidetector CT imaging of the chest was performed following the standard protocol during bolus administration of intravenous contrast.  CONTRAST:  80mL OMNIPAQUE IOHEXOL 300 MG/ML  SOLN  COMPARISON:  None.  FINDINGS: CT NECK FINDINGS  Negative for abscess. Normal vascular enhancement. No focal mucosal lesion. No adenopathy localized. Regional bones unremarkable. No tracheal narrowing.  CT CHEST FINDINGS  No pleural or pericardial effusion. No hilar or mediastinal adenopathy. Lungs are clear. Surgical clips in the gallbladder fossa. Small low-attenuation liver lesions, largest hepatic segment 6, 9 mm diameter (previously 12 mm). Remainder visualized upper abdomen unremarkable. 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  IMPRESSION: 1. No acute neck or chest process.   Electronically Signed   By: Oley Balm M.D.   On: 01/07/2013 18:51    EKG Interpretation   None       MDM   1. Allergic reaction, initial encounter    Patient re-evaluated prior to dc, is  hemodynamically stable, in no respiratory distress, and denies the feeling of throat closing. Pt has been advised to take prednisone, Pepcid, and benadryl & return to the ED if they have a mod-severe allergic rxn (s/s including throat closing, difficulty breathing, swelling of lips face or tongue). Pt is to follow up with their PCP. Discussed that many times there are coupons to get EpiPens for free and she should look into this. Pt is agreeable with plan & verbalizes understanding. I discussed this case with Dr. Ranae Palms who agrees with plan.      Mora Bellman, PA-C 01/09/13 [phone]

## 2013-01-08 NOTE — ED Notes (Signed)
Bed: WA02 Expected date:  Expected time:  Means of arrival:  Comments: EMS/allergic reaction treated by EMS

## 2013-01-08 NOTE — ED Notes (Signed)
Pt was dx with bronchitis 1.5 days ago pt was given albuterol and prednisone and pepsid. Pt became worse as she went home pt went back to ED and was said to be allergic to Albuterol.  Epi was given. Pt was at home tonight pt throat started closing up pt had hives wheezing shortness of breath tachy at 160 when ems arrived. Pt was given .3 epi with benadryl and zantac on scene.

## 2013-01-09 MED ORDER — SODIUM CHLORIDE 0.9 % IV BOLUS (SEPSIS)
1000.0000 mL | Freq: Once | INTRAVENOUS | Status: AC
  Administered 2013-01-09: 1000 mL via INTRAVENOUS

## 2013-01-09 NOTE — ED Notes (Signed)
Pt states she feels much better than when she came in. She feels like her breathing is back to normal. Her skin is not flushed. Pt vitals are WNL. No complain of pain.

## 2013-01-10 NOTE — ED Provider Notes (Signed)
Medical screening examination/treatment/procedure(s) were performed by non-physician practitioner and as supervising physician I was immediately available for consultation/collaboration.   Loren Racer, MD 01/10/13 [phone]

## 2013-02-24 ENCOUNTER — Emergency Department (HOSPITAL_COMMUNITY): Payer: BC Managed Care – PPO

## 2013-02-24 ENCOUNTER — Other Ambulatory Visit: Payer: Self-pay

## 2013-02-24 ENCOUNTER — Encounter (HOSPITAL_COMMUNITY): Payer: Self-pay | Admitting: Emergency Medicine

## 2013-02-24 ENCOUNTER — Emergency Department (HOSPITAL_COMMUNITY)
Admission: EM | Admit: 2013-02-24 | Discharge: 2013-02-25 | Disposition: A | Discharge status: Home / Self Care | Payer: BC Managed Care – PPO | Attending: Emergency Medicine | Admitting: Emergency Medicine

## 2013-02-24 DIAGNOSIS — R061 Stridor: Secondary | ICD-10-CM | POA: Insufficient documentation

## 2013-02-24 DIAGNOSIS — I10 Essential (primary) hypertension: Secondary | ICD-10-CM | POA: Insufficient documentation

## 2013-02-24 DIAGNOSIS — Z792 Long term (current) use of antibiotics: Secondary | ICD-10-CM | POA: Insufficient documentation

## 2013-02-24 DIAGNOSIS — R000 Tachycardia, unspecified: Secondary | ICD-10-CM | POA: Insufficient documentation

## 2013-02-24 DIAGNOSIS — K219 Gastro-esophageal reflux disease without esophagitis: Secondary | ICD-10-CM

## 2013-02-24 LAB — BASIC METABOLIC PANEL
BUN: 15 mg/dL (ref 6–23)
CHLORIDE: 104 meq/L (ref 96–112)
CO2: 22 mEq/L (ref 19–32)
Calcium: 9.6 mg/dL (ref 8.4–10.5)
Creatinine, Ser: 0.63 mg/dL (ref 0.50–1.10)
GFR calc non Af Amer: >90 mL/min (ref >90)
Glucose, Bld: 123 mg/dL — ABNORMAL HIGH (ref 70–99)
Potassium: 3.9 mEq/L (ref 3.7–5.3)
SODIUM: 144 meq/L (ref 137–147)

## 2013-02-24 LAB — POCT I-STAT TROPONIN I: TROPONIN I, POC: 0 ng/mL (ref 0.00–0.08)

## 2013-02-24 MED ORDER — DEXTROSE 5 % IV SOLN
1.0000 g | Freq: Once | INTRAVENOUS | Status: AC
  Administered 2013-02-25: 1 g via INTRAVENOUS
  Filled 2013-02-24: qty 10

## 2013-02-24 MED ORDER — CLINDAMYCIN PHOSPHATE 900 MG/50ML IV SOLN
900.0000 mg | Freq: Once | INTRAVENOUS | Status: AC
  Administered 2013-02-25: 900 mg via INTRAVENOUS
  Filled 2013-02-24: qty 50

## 2013-02-24 MED ORDER — ONDANSETRON HCL 4 MG/2ML IJ SOLN
4.0000 mg | Freq: Once | INTRAMUSCULAR | Status: AC
  Administered 2013-02-24: 4 mg via INTRAVENOUS
  Filled 2013-02-24: qty 2

## 2013-02-24 MED ORDER — VANCOMYCIN HCL IN DEXTROSE 1-5 GM/200ML-% IV SOLN: 1000.0000 mg | Freq: Once | INTRAVENOUS | Status: DC

## 2013-02-24 MED ORDER — PIPERACILLIN-TAZOBACTAM 3.375 G IVPB: 3.3750 g | Freq: Once | INTRAVENOUS | Status: DC

## 2013-02-24 NOTE — ED Provider Notes (Signed)
CSN: [id number]     Arrival date & time 02/24/13  2247 History   First MD Initiated Contact with Patient 02/24/13 2327     Chief Complaint  Patient presents with  . Shortness of Breath     (Consider location/radiation/quality/duration/timing/severity/associated sxs/prior Treatment) HPI HX per PT  - throat pain tonight points to area upper sternum, having trouble clearing her throat with trouble breathing. Just finished Z pack for bronchitis and is currently taking steroids for the same. She has been having cough, no fevers, no rash. Symptoms feel like she has phlegm that she cant clear. Symptoms MOD to severe. She has to lean forward to breath.  Past Medical History  Diagnosis Date  . GERD (gastroesophageal reflux disease)   . Hypertension    Past Surgical History  Procedure Laterality Date  . Tympanostomy tube placement  1991, 1993, 2003  . Tonsillectomy and adenoidectomy  1992  . Laparoscopic cholecystectomy  2008  . Cesarean section  2010, 2012   Family History  Problem Relation Age of Onset  . Cancer Mother   . Heart disease Mother   . Diabetes Father   . Cancer Maternal Uncle   . Diabetes Maternal Grandmother   . Asthma Maternal Grandmother   . Diabetes Maternal Grandfather   . Diabetes Paternal Grandmother   . Diabetes Paternal Grandfather   . Breast cancer Maternal Grandmother    History  Substance Use Topics  . Smoking status: Never Smoker   . Smokeless tobacco: Not on file  . Alcohol Use: No   OB History   Grav Para Term Preterm Abortions TAB SAB Ect Mult Living   2 2  2            Review of Systems  Constitutional: Negative for fever and chills.  HENT: Positive for voice change.   Respiratory: Positive for cough and shortness of breath.   Cardiovascular: Positive for chest pain.  Gastrointestinal: Negative for vomiting and abdominal pain.  Genitourinary: Negative for dysuria.  Musculoskeletal: Negative for back pain.  Skin: Negative for rash.   Neurological: Negative for headaches.  All other systems reviewed and are negative.      Allergies  Albuterol; Hydrocodone; Codeine; Lortab; and Morphine and related  Home Medications   Current Outpatient Rx  Name  Route  Sig  Dispense  Refill  . predniSONE (DELTASONE) 5 MG tablet   Oral   Take 5 mg by mouth every evening.          Marland Kitchen azithromycin (ZITHROMAX) 250 MG tablet   Oral   Take 250 mg by mouth daily. Zpak directions          BP 149/102  Pulse 141  Temp(Src) 97.6 F (36.4 C) (Oral)  Resp 20  SpO2 99% Physical Exam  Constitutional: She is oriented to person, place, and time. She appears well-developed and well-nourished.  Sitting upright with neck forward   HENT:  Head: Normocephalic and atraumatic.  Uvula midline, hoarse voice, no tonsillar enlargement  Eyes: EOM are normal. Pupils are equal, round, and reactive to light. No scleral icterus.  Neck: Neck supple. No tracheal deviation present.  Cardiovascular: Regular rhythm and intact distal pulses.   tachycardia  Pulmonary/Chest: No stridor.  tachypnea with coughing and upper airway noises, lungs clear otherwise  Abdominal: Soft. There is no tenderness.  Musculoskeletal: Normal range of motion. She exhibits no edema.  Neurological: She is alert and oriented to person, place, and time.  Skin: Skin is warm and dry.  ED Course  Procedures (including critical care time) Labs Review Labs Reviewed  CBC  BASIC METABOLIC PANEL  POCT I-STAT TROPONIN I   Imaging Review Dg Neck Soft Tissue  02/24/2013   CLINICAL DATA:  Short of breath.  EXAM: NECK SOFT TISSUES - 1+ VIEW  COMPARISON:  CT NECK W/CM dated 01/07/2013; DG CHEST 1V PORT dated 02/24/2013  FINDINGS: Straightening of the normal cervical lordosis. Prevertebral soft tissues are normal. Epiglottis and aryepiglottic folds appear normal. Cervical spine appears normal on this single view.  IMPRESSION: Negative.   Electronically Signed   By: Dereck Ligas  M.D.   On: 02/24/2013 23:53   Dg Chest Port 1 View  02/24/2013   CLINICAL DATA:  Short of breath.  EXAM: PORTABLE CHEST - 1 VIEW  COMPARISON:  CT CHEST W/CM dated 01/07/2013  FINDINGS: Cardiopericardial silhouette within normal limits. Mediastinal contours normal. Trachea midline. No airspace disease or effusion. Monitoring leads project over the chest.  IMPRESSION: No active disease.   Electronically Signed   By: Dereck Ligas M.D.   On: 02/24/2013 23:42    Date: 02/25/2013  Rate: 138  Rhythm: sinus tachycardia  QRS Axis: normal  Intervals: normal  ST/T Wave abnormalities: nonspecific ST changes  Conduction Disutrbances:none  Narrative Interpretation:   Old EKG Reviewed: none available   11:59 PM d/w ENT - Dr Wilburn Cornelia to evaluate  IV antibiotics provided - Dr. Wilburn Cornelia scoped patient that side, normal epiglottis with erythema and concern for reflux with history of same. Recommends no indication for admit. Recommends twice a day PPI. will stop prednisone. consider antibiotics for mild leukocytosis and resolving respiratory infection.   Recheck -  tachycardia significantly improved. Oral protonix and pepcid.   5:06 AM heart rate normalized. Patient resting and breathing comfortably without any further choking or gagging sensation. Plan discharge home with Pepcid and Protonix. GI referral provided. Patient states understanding strict preterm precautions. MDM   Diagnosis: GERD  Presented with difficulty breathing, throat pain. ENT bedside scope no epiglottitis, history of GERD off of acid reducing medications, and symptoms likely exacerbated by recent course of prednisone Improved with medications provided Imaging obtained and reviewed as above - chest x-ray and soft tissue neck Vital signs and nursing notes reviewed and considered.   Teressa Lower, MD 02/25/13 [phone]

## 2013-02-24 NOTE — ED Notes (Signed)
Pt states she was recently treated for bronchitis and is not getting better with prescribed med.  Pt noted to be grunting and having trouble clearing throat in triage.

## 2013-02-25 LAB — CBC
HCT: 44.2 % (ref 36.0–46.0)
Hemoglobin: 16.4 g/dL — ABNORMAL HIGH (ref 12.0–15.0)
MCH: 32.4 pg (ref 26.0–34.0)
MCHC: 37.1 g/dL — ABNORMAL HIGH (ref 30.0–36.0)
MCV: 87.4 fL (ref 78.0–100.0)
Platelets: 300 10*3/uL (ref 150–400)
RBC: 5.06 MIL/uL (ref 3.87–5.11)
RDW: 12.1 % (ref 11.5–15.5)
WBC: 11.9 10*3/uL — ABNORMAL HIGH (ref 4.0–10.5)

## 2013-02-25 MED ORDER — PANTOPRAZOLE SODIUM 40 MG PO TBEC
40.0000 mg | DELAYED_RELEASE_TABLET | Freq: Once | ORAL | Status: AC
  Administered 2013-02-25: 40 mg via ORAL
  Filled 2013-02-25: qty 1

## 2013-02-25 MED ORDER — FAMOTIDINE 20 MG PO TABS
20.0000 mg | ORAL_TABLET | Freq: Once | ORAL | Status: AC
  Administered 2013-02-25: 20 mg via ORAL
  Filled 2013-02-25: qty 1

## 2013-02-25 MED ORDER — PANTOPRAZOLE SODIUM 20 MG PO TBEC: 20.0000 mg | DELAYED_RELEASE_TABLET | Freq: Two times a day (BID) | ORAL | Status: DC

## 2013-02-25 MED ORDER — FAMOTIDINE 20 MG PO TABS: 20.0000 mg | ORAL_TABLET | Freq: Two times a day (BID) | ORAL | Status: DC

## 2013-02-25 NOTE — ED Notes (Signed)
ENT MD at bedside.

## 2013-02-25 NOTE — Consult Note (Signed)
ENT CONSULT:  Reason for Consult:Globus Sensation Referring Physician: EDP  Amy Rivas is an 23 y.o. female.  HPI: Pt presents with a one-week history of bronchitis, treated with Zithromax and prednisone. She complains of a several day history of throat fullness, globus sensation and nonproductive cough. No shortness of breath or stridor, no fever. She has a history of gastroesophageal reflux and recently completed PPI therapy. No prior history of asthma, dysphagia or regurgitation.  Past Medical History  Diagnosis Date  . GERD (gastroesophageal reflux disease)   . Hypertension     Past Surgical History  Procedure Laterality Date  . Tympanostomy tube placement  1991, 1993, 2003  . Tonsillectomy and adenoidectomy  1992  . Laparoscopic cholecystectomy  2008  . Cesarean section  2010, 2012    Family History  Problem Relation Age of Onset  . Cancer Mother   . Heart disease Mother   . Diabetes Father   . Cancer Maternal Uncle   . Diabetes Maternal Grandmother   . Asthma Maternal Grandmother   . Diabetes Maternal Grandfather   . Diabetes Paternal Grandmother   . Diabetes Paternal Grandfather   . Breast cancer Maternal Grandmother     Social History:  reports that she has never smoked. She does not have any smokeless tobacco history on file. She reports that she does not drink alcohol or use illicit drugs.  Allergies:  Allergies  Allergen Reactions  . Albuterol Anaphylaxis  . Hydrocodone Anaphylaxis and Nausea And Vomiting  . Codeine Nausea And Vomiting  . Lortab [Hydrocodone-Acetaminophen] Nausea And Vomiting  . Morphine And Related Swelling and Rash    Medications: I have reviewed the patient's current medications.  Results for orders placed during the hospital encounter of 02/24/13 (from the past 48 hour(s))  CBC     Status: Abnormal   Collection Time    02/24/13 11:15 PM      Result Value Range   WBC 11.9 (*) 4.0 - 10.5 K/uL   RBC 5.06  3.87 - 5.11 MIL/uL   Hemoglobin 16.4 (*) 12.0 - 15.0 g/dL   HCT 44.2  36.0 - 46.0 %   MCV 87.4  78.0 - 100.0 fL   MCH 32.4  26.0 - 34.0 pg   MCHC 37.1 (*) 30.0 - 36.0 g/dL   Comment: RULED OUT INTERFERING SUBSTANCES   RDW 12.1  11.5 - 15.5 %   Platelets 300  150 - 400 K/uL  BASIC METABOLIC PANEL     Status: Abnormal   Collection Time    02/24/13 11:15 PM      Result Value Range   Sodium 144  137 - 147 mEq/L   Potassium 3.9  3.7 - 5.3 mEq/L   Chloride 104  96 - 112 mEq/L   CO2 22  19 - 32 mEq/L   Glucose, Bld 123 (*) 70 - 99 mg/dL   BUN 15  6 - 23 mg/dL   Creatinine, Ser 0.63  0.50 - 1.10 mg/dL   Calcium 9.6  8.4 - 10.5 mg/dL   GFR calc non Af Amer >90  >90 mL/min   GFR calc Af Amer >90  >90 mL/min   Comment: (NOTE)     The eGFR has been calculated using the CKD EPI equation.     This calculation has not been validated in all clinical situations.     eGFR's persistently <90 mL/min signify possible Chronic Kidney     Disease.  POCT I-STAT TROPONIN I  Status: None   Collection Time    02/24/13 11:17 PM      Result Value Range   Troponin i, poc 0.00  0.00 - 0.08 ng/mL   Comment 3            Comment: Due to the release kinetics of cTnI,     a negative result within the first hours     of the onset of symptoms does not rule out     myocardial infarction with certainty.     If myocardial infarction is still suspected,     repeat the test at appropriate intervals.    Dg Neck Soft Tissue  02/24/2013   CLINICAL DATA:  Short of breath.  EXAM: NECK SOFT TISSUES - 1+ VIEW  COMPARISON:  CT NECK W/CM dated 01/07/2013; DG CHEST 1V PORT dated 02/24/2013  FINDINGS: Straightening of the normal cervical lordosis. Prevertebral soft tissues are normal. Epiglottis and aryepiglottic folds appear normal. Cervical spine appears normal on this single view.  IMPRESSION: Negative.   Electronically Signed   By: Dereck Ligas M.D.   On: 02/24/2013 23:53   Dg Chest Port 1 View  02/24/2013   CLINICAL DATA:  Short of breath.   EXAM: PORTABLE CHEST - 1 VIEW  COMPARISON:  CT CHEST W/CM dated 01/07/2013  FINDINGS: Cardiopericardial silhouette within normal limits. Mediastinal contours normal. Trachea midline. No airspace disease or effusion. Monitoring leads project over the chest.  IMPRESSION: No active disease.   Electronically Signed   By: Dereck Ligas M.D.   On: 02/24/2013 23:42    ROS: History of sore throat, status post tonsillectomy. No significant dysphagia or aspiration symptoms  Blood pressure 148/98, pulse 119, temperature 97.6 F (36.4 C), temperature source Oral, resp. rate 23, SpO2 100.00%.  PHYSICAL EXAM: General appearance - healthy appearing adult in mild distress, no stridor or airway concerns. Mouth - mucous membranes moist, pharynx normal without lesions and Status post tonsillectomy, no erythema or swelling, no exudate. Neck - supple, no significant adenopathy  FLEXIBLE LARYNGOSCOPY: Flexible laryngoscopy performed shows normal nasopharynx, base of tongue and supraglottis, normal epiglottis without swelling or erythema, normal vocal cord mobility without nodule, mass or polyp. Moderate posterior glottic erythema and swelling consistent gastroesophageal reflux, no airway obstruction or concerns.  Studies Reviewed: Above x-ray studies and mildly elevated WBC  Assessment/Plan: Patient with symptoms and history of recent respiratory tract infection, patient complaining of cough, globus sensation and throat fullness. Recently treated for bronchitis with Zithromax and prednisone. Patient with history of gastroesophageal reflux, completed course of PPI therapy. Based on history and findings she may have an element of respiratory tract infection may benefit from additional antibiotic therapy, primary complaints of globus and throat fullness could be related to gastroesophageal reflux based on laryngoscopy. Airway otherwise normal, no evidence of stridor or obstruction. Recommend aggressive reflux therapy,  monitor symptoms and followup as needed.  [place], Elona Yinger 02/25/2013, 12:38 AM

## 2013-02-25 NOTE — Discharge Instructions (Signed)

## 2013-03-03 LAB — CULTURE, BLOOD (ROUTINE X 2)
CULTURE: NO GROWTH
Culture: NO GROWTH

## 2013-03-05 ENCOUNTER — Encounter (INDEPENDENT_AMBULATORY_CARE_PROVIDER_SITE_OTHER): Payer: Self-pay | Admitting: Surgery

## 2013-04-14 ENCOUNTER — Emergency Department (HOSPITAL_COMMUNITY): Payer: BC Managed Care – PPO

## 2013-04-14 ENCOUNTER — Encounter (HOSPITAL_COMMUNITY): Payer: Self-pay | Admitting: Emergency Medicine

## 2013-04-14 ENCOUNTER — Observation Stay (HOSPITAL_COMMUNITY)
Admission: EM | Admit: 2013-04-14 | Discharge: 2013-04-16 | Disposition: A | Discharge status: Home / Self Care | Payer: BC Managed Care – PPO | Attending: Internal Medicine | Admitting: Internal Medicine

## 2013-04-14 DIAGNOSIS — R7402 Elevation of levels of lactic acid dehydrogenase (LDH): Secondary | ICD-10-CM | POA: Insufficient documentation

## 2013-04-14 DIAGNOSIS — R7401 Elevation of levels of liver transaminase levels: Secondary | ICD-10-CM | POA: Insufficient documentation

## 2013-04-14 DIAGNOSIS — R1110 Vomiting, unspecified: Secondary | ICD-10-CM

## 2013-04-14 DIAGNOSIS — R740 Nonspecific elevation of levels of transaminase and lactic acid dehydrogenase [LDH]: Secondary | ICD-10-CM

## 2013-04-14 DIAGNOSIS — K219 Gastro-esophageal reflux disease without esophagitis: Secondary | ICD-10-CM | POA: Insufficient documentation

## 2013-04-14 DIAGNOSIS — E860 Dehydration: Secondary | ICD-10-CM | POA: Insufficient documentation

## 2013-04-14 DIAGNOSIS — R7989 Other specified abnormal findings of blood chemistry: Secondary | ICD-10-CM

## 2013-04-14 DIAGNOSIS — R000 Tachycardia, unspecified: Secondary | ICD-10-CM

## 2013-04-14 DIAGNOSIS — I10 Essential (primary) hypertension: Secondary | ICD-10-CM | POA: Insufficient documentation

## 2013-04-14 DIAGNOSIS — A088 Other specified intestinal infections: Principal | ICD-10-CM | POA: Insufficient documentation

## 2013-04-14 DIAGNOSIS — J029 Acute pharyngitis, unspecified: Secondary | ICD-10-CM | POA: Insufficient documentation

## 2013-04-14 DIAGNOSIS — R945 Abnormal results of liver function studies: Secondary | ICD-10-CM

## 2013-04-14 DIAGNOSIS — R109 Unspecified abdominal pain: Secondary | ICD-10-CM

## 2013-04-14 LAB — CBC WITH DIFFERENTIAL/PLATELET
Basophils Absolute: 0 10*3/uL (ref 0.0–0.1)
Basophils Relative: 0 % (ref 0–1)
Eosinophils Absolute: 0 10*3/uL (ref 0.0–0.7)
Eosinophils Relative: 0 % (ref 0–5)
HCT: 45 % (ref 36.0–46.0)
HEMOGLOBIN: 17 g/dL — AB (ref 12.0–15.0)
LYMPHS ABS: 0.7 10*3/uL (ref 0.7–4.0)
Lymphocytes Relative: 5 % — ABNORMAL LOW (ref 12–46)
MCH: 33.6 pg (ref 26.0–34.0)
MCHC: 37.8 g/dL — ABNORMAL HIGH (ref 30.0–36.0)
MCV: 88.9 fL (ref 78.0–100.0)
MONOS PCT: 6 % (ref 3–12)
Monocytes Absolute: 0.8 10*3/uL (ref 0.1–1.0)
NEUTROS ABS: 12.2 10*3/uL — AB (ref 1.7–7.7)
Neutrophils Relative %: 89 % — ABNORMAL HIGH (ref 43–77)
PLATELETS: 197 10*3/uL (ref 150–400)
RBC: 5.06 MIL/uL (ref 3.87–5.11)
RDW: 11.6 % (ref 11.5–15.5)
WBC: 14.6 10*3/uL — ABNORMAL HIGH (ref 4.0–10.5)

## 2013-04-14 LAB — COMPREHENSIVE METABOLIC PANEL
ALBUMIN: 4.7 g/dL (ref 3.5–5.2)
ALK PHOS: 50 U/L (ref 39–117)
ALT: 9 U/L (ref 0–35)
AST: 16 U/L (ref 0–37)
BILIRUBIN TOTAL: 0.8 mg/dL (ref 0.3–1.2)
BUN: 11 mg/dL (ref 6–23)
CHLORIDE: 101 meq/L (ref 96–112)
CO2: 20 mEq/L (ref 19–32)
Calcium: 9.7 mg/dL (ref 8.4–10.5)
Creatinine, Ser: 0.68 mg/dL (ref 0.50–1.10)
GFR calc Af Amer: >90 mL/min (ref >90)
GFR calc non Af Amer: >90 mL/min (ref >90)
Glucose, Bld: 92 mg/dL (ref 70–99)
Potassium: 3.9 mEq/L (ref 3.7–5.3)
SODIUM: 140 meq/L (ref 137–147)
Total Protein: 7.8 g/dL (ref 6.0–8.3)

## 2013-04-14 LAB — PREGNANCY, URINE: Preg Test, Ur: NEGATIVE

## 2013-04-14 LAB — URINALYSIS, ROUTINE W REFLEX MICROSCOPIC
Bilirubin Urine: NEGATIVE
Glucose, UA: NEGATIVE mg/dL
Hgb urine dipstick: NEGATIVE
Ketones, ur: 40 mg/dL — AB
LEUKOCYTES UA: NEGATIVE
NITRITE: NEGATIVE
PROTEIN: NEGATIVE mg/dL
SPECIFIC GRAVITY, URINE: 1.024 (ref 1.005–1.030)
UROBILINOGEN UA: 0.2 mg/dL (ref 0.0–1.0)
pH: 5.5 (ref 5.0–8.0)

## 2013-04-14 LAB — WET PREP, GENITAL
CLUE CELLS WET PREP: NONE SEEN
Trich, Wet Prep: NONE SEEN
Yeast Wet Prep HPF POC: NONE SEEN

## 2013-04-14 LAB — TSH: TSH: 1.18 u[IU]/mL (ref 0.350–4.500)

## 2013-04-14 LAB — LIPASE, BLOOD: Lipase: 17 U/L (ref 11–59)

## 2013-04-14 LAB — INFLUENZA PANEL BY PCR (TYPE A & B)
H1N1FLUPCR: NOT DETECTED
INFLBPCR: NEGATIVE
Influenza A By PCR: NEGATIVE

## 2013-04-14 MED ORDER — FENTANYL CITRATE 0.05 MG/ML IJ SOLN
100.0000 ug | Freq: Once | INTRAMUSCULAR | Status: AC
  Administered 2013-04-14: 100 ug via INTRAVENOUS
  Filled 2013-04-14: qty 2

## 2013-04-14 MED ORDER — IOHEXOL 300 MG/ML  SOLN
100.0000 mL | Freq: Once | INTRAMUSCULAR | Status: AC | PRN
  Administered 2013-04-14: 80 mL via INTRAVENOUS

## 2013-04-14 MED ORDER — SODIUM CHLORIDE 0.9 % IV BOLUS (SEPSIS): 500.0000 mL | Freq: Once | INTRAVENOUS | Status: DC

## 2013-04-14 MED ORDER — ONDANSETRON HCL 4 MG/2ML IJ SOLN
4.0000 mg | Freq: Four times a day (QID) | INTRAMUSCULAR | Status: DC | PRN
  Administered 2013-04-14: 4 mg via INTRAVENOUS
  Filled 2013-04-14: qty 2

## 2013-04-14 MED ORDER — SODIUM CHLORIDE 0.9 % IV SOLN: INTRAVENOUS | Status: AC

## 2013-04-14 MED ORDER — ONDANSETRON HCL 4 MG/2ML IJ SOLN: 4.0000 mg | Freq: Three times a day (TID) | INTRAMUSCULAR | Status: DC | PRN

## 2013-04-14 MED ORDER — IOHEXOL 300 MG/ML  SOLN
25.0000 mL | INTRAMUSCULAR | Status: AC
  Administered 2013-04-14: 25 mL via ORAL

## 2013-04-14 MED ORDER — SODIUM CHLORIDE 0.9 % IJ SOLN
3.0000 mL | Freq: Two times a day (BID) | INTRAMUSCULAR | Status: DC
  Administered 2013-04-14 – 2013-04-15 (×2): 3 mL via INTRAVENOUS

## 2013-04-14 MED ORDER — ONDANSETRON HCL 4 MG/2ML IJ SOLN
4.0000 mg | Freq: Once | INTRAMUSCULAR | Status: AC
  Administered 2013-04-14: 4 mg via INTRAVENOUS
  Filled 2013-04-14: qty 2

## 2013-04-14 MED ORDER — PROMETHAZINE HCL 25 MG/ML IJ SOLN
25.0000 mg | Freq: Once | INTRAMUSCULAR | Status: AC
  Administered 2013-04-14: 25 mg via INTRAVENOUS
  Filled 2013-04-14: qty 1

## 2013-04-14 MED ORDER — SODIUM CHLORIDE 0.9 % IV SOLN
Freq: Once | INTRAVENOUS | Status: AC
  Administered 2013-04-14: 16:00:00 via INTRAVENOUS

## 2013-04-14 MED ORDER — FENTANYL CITRATE 0.05 MG/ML IJ SOLN
50.0000 ug | INTRAMUSCULAR | Status: DC | PRN
  Administered 2013-04-14 (×2): 50 ug via INTRAVENOUS
  Filled 2013-04-14 (×2): qty 2

## 2013-04-14 MED ORDER — ACETAMINOPHEN 325 MG PO TABS: 650.0000 mg | ORAL_TABLET | Freq: Four times a day (QID) | ORAL | Status: DC | PRN

## 2013-04-14 MED ORDER — SODIUM CHLORIDE 0.9 % IV SOLN
INTRAVENOUS | Status: DC
  Administered 2013-04-14 – 2013-04-15 (×2): 100 mL/h via INTRAVENOUS
  Administered 2013-04-15 – 2013-04-16 (×2): via INTRAVENOUS

## 2013-04-14 MED ORDER — ONDANSETRON HCL 4 MG PO TABS: 4.0000 mg | ORAL_TABLET | Freq: Four times a day (QID) | ORAL | Status: DC | PRN

## 2013-04-14 NOTE — ED Provider Notes (Signed)
CSN: [id number]     Arrival date & time 04/14/13  7035 History   First MD Initiated Contact with Patient 04/14/13 [phone]     Chief Complaint  Patient presents with  . Abdominal Pain  . Fever  . Constipation     (Consider location/radiation/quality/duration/timing/severity/associated sxs/prior Treatment) HPI Comments: Amy Rivas is a 23 y.o. female with a past medical history of HTN, GERD presenting the Emergency Department with a chief complaint of RLQ pain, fever.  The patient reports abdominal pain since yesterday.  She reports mid abdominal pain yesterday, RLQ discomfort since this morning. She reports associated non-bloody emesis. The patient was sent from UC today, given zofran and phenergan, no further emesis. Last BM today.  She also reports a fever 103.2, OTC tylenol and ibuprofen, last dose 0500 Ibuprofen 0700. LMP: 03/19/2013. Reports similar pain in the past with ovarian cysts but current episode is more severe. Abdominal surgeries: C-section x2, cholecystomy. PCP: Osa Craver, MD   The history is provided by the patient and medical records. No language interpreter was used.    Past Medical History  Diagnosis Date  . GERD (gastroesophageal reflux disease)   . Hypertension    Past Surgical History  Procedure Laterality Date  . Tympanostomy tube placement  1991, 1993, 2003  . Tonsillectomy and adenoidectomy  1992  . Laparoscopic cholecystectomy  2008  . Cesarean section  2010, 2012   Family History  Problem Relation Age of Onset  . Cancer Mother   . Heart disease Mother   . Diabetes Father   . Cancer Maternal Uncle   . Diabetes Maternal Grandmother   . Asthma Maternal Grandmother   . Diabetes Maternal Grandfather   . Diabetes Paternal Grandmother   . Diabetes Paternal Grandfather   . Breast cancer Maternal Grandmother    History  Substance Use Topics  . Smoking status: Never Smoker   . Smokeless tobacco: Not on file  . Alcohol Use: No   OB History    Grav Para Term Preterm Abortions TAB SAB Ect Mult Living   2 2  2            Review of Systems  Constitutional: Positive for fever and chills.  Gastrointestinal: Positive for nausea, vomiting and abdominal pain. Negative for diarrhea, constipation and blood in stool.  Genitourinary: Negative for dysuria, hematuria, flank pain, vaginal bleeding, vaginal discharge and menstrual problem.      Allergies  Albuterol; Hydrocodone; Codeine; Lortab; and Morphine and related  Home Medications   Current Outpatient Rx  Name  Route  Sig  Dispense  Refill  . acetaminophen (TYLENOL) 500 MG tablet   Oral   Take 500-1,000 mg by mouth every 6 (six) hours as needed for mild pain, moderate pain, fever or headache.         . ibuprofen (ADVIL,MOTRIN) 200 MG tablet   Oral   Take 200-600 mg by mouth every 6 (six) hours as needed for fever, headache, mild pain, moderate pain or cramping.          BP 105/55  Pulse 136  Temp(Src) 98.1 F (36.7 C) (Oral)  Resp 17  Wt 103 lb (46.72 kg)  SpO2 99%  LMP 03/19/2013 Physical Exam  Nursing note and vitals reviewed. Constitutional: She is oriented to person, place, and time. She appears well-developed and well-nourished. No distress.  HENT:  Head: Normocephalic and atraumatic.  Mouth/Throat: No oropharyngeal exudate.  Neck: Neck supple.  Cardiovascular: Regular rhythm.  Tachycardia present.   Pulmonary/Chest:  Effort normal and breath sounds normal. No respiratory distress. She has no wheezes. She has no rales.  Abdominal: Soft. Normal appearance and bowel sounds are normal. There is tenderness in the right lower quadrant. There is guarding, CVA tenderness and tenderness at McBurney's point. There is negative Murphy's sign.  Positive Rovsing's sign.  Positive psoas. Right CVA tenderness  Genitourinary: Uterus is tender. Cervix exhibits discharge. Right adnexum displays tenderness. Right adnexum displays no mass. Left adnexum displays no mass, no  tenderness and no fullness.  Mild tenderness Right adnexa. Mild tenderness of the uterus. Chaperone present.   Neurological: She is alert and oriented to person, place, and time.  Skin: Skin is warm and dry. No rash noted. She is not diaphoretic.  Psychiatric: She has a normal mood and affect. Her behavior is normal. Thought content normal.    ED Course  Procedures (including critical care time) Labs Review Labs Reviewed  WET PREP, GENITAL - Abnormal; Notable for the following:    WBC, Wet Prep HPF POC MODERATE (*)    All other components within normal limits  CBC WITH DIFFERENTIAL - Abnormal; Notable for the following:    WBC 14.6 (*)    Hemoglobin 17.0 (*)    MCHC 37.8 (*)    Neutrophils Relative % 89 (*)    Neutro Abs 12.2 (*)    Lymphocytes Relative 5 (*)    All other components within normal limits  URINALYSIS, ROUTINE W REFLEX MICROSCOPIC - Abnormal; Notable for the following:    Ketones, ur 40 (*)    All other components within normal limits  GC/CHLAMYDIA PROBE AMP  COMPREHENSIVE METABOLIC PANEL  PREGNANCY, URINE  INFLUENZA PANEL BY PCR (TYPE A & B, H1N1)  LIPASE, BLOOD  TSH   Imaging Review Ct Abdomen Pelvis W Contrast  04/14/2013   CLINICAL DATA:  Right lower quadrant pain, nausea vomiting.  EXAM: CT ABDOMEN AND PELVIS WITH CONTRAST  TECHNIQUE: Multidetector CT imaging of the abdomen and pelvis was performed using the standard protocol following bolus administration of intravenous contrast.  CONTRAST:  62mL OMNIPAQUE IOHEXOL 300 MG/ML  SOLN  COMPARISON:  Prior CT from 09/25/2012  FINDINGS: The visualized lung bases are clear.  Focal fat deposition noted adjacent to falciform fissure. Liver is otherwise unremarkable. Gallbladder is within normal limits. No biliary ductal dilatation. The spleen, adrenal glands, and pancreas demonstrate a normal contrast enhanced appearance.  Subcentimeter hypodensities within the left kidney are noted, too small the characterize by CT, but  statistically likely represent small cysts. 3 mm nonobstructive stone present within the left kidney. The kidneys are otherwise unremarkable without evidence of nephrolithiasis, hydronephrosis, or focal enhancing renal mass.  No evidence of bowel obstruction the appendix is well visualized within the right lower quadrant and is of normal caliber and appearance without associated inflammatory changes to suggest acute appendicitis. No abnormal wall thickening, mucosal enhancement, or inflammatory fat stranding seen about the bowels.  Bladder is within normal limits. 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 A 2.8 x 3.0 x 2.6 cm unilocular hypodense cysts present within the right ovary, likely a physiologic cyst. Small amount of free fluid present within the pelvis. The left ovary is within normal limits.  No free intraperitoneal air. No pathologically enlarged intra-abdominal pelvic lymph nodes. Normal intravascular enhancement seen throughout the abdomen and pelvis.  No acute osseous abnormality. No worrisome lytic or blastic osseous lesions.  IMPRESSION: 1. Small volume free fluid within the pelvis with 3 cm hypodense  right ovarian cyst, most compatible with a physiologic cyst. 2. No other CT evidence of acute intra-abdominal or pelvic process. Normal appendix. 3. 3 mm nonobstructive left renal stone. 4. Prior cholecystectomy. 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   Electronically Signed   By: Jeannine Boga M.D.   On: 04/14/2013 13:31     EKG Interpretation None      MDM   Final diagnoses:  Abdominal pain  Vomiting  Tachycardia   Pt with RLQ discomfort, fever, tachycardic.  Will CT for appendicitis.  Pelvis shows mild discharge, mild adnexa tenderness, increase in tenderness to the RLQ.  CBC shows slight leukocytosis.  Hbg 17.0, questionable dehydration. Negative urine pregnancy.  Ptr reports symptom improvement.  No further  emesis.  Pt with "dry heaving" per RN, phenergan ordered. CT shows normal appendix, likely physiologic cyst. Pt with persistent tachycardic. IV bolus and cardiac monitoring verbal order per nurse. 1505 Discussed with Dr. Regenia Skeeter, verbal orders were never initiated. Will admit for vomiting and RLQ tenderness. Re-eval pt persistently tachycardic reports RLQ discomfort 6/10. Pain medication ordered. Discussed with Dr. Cruzita Lederer who will evaluate the patient in the ED and admit for further evaluation.  Meds given in ED:  Medications  iohexol (OMNIPAQUE) 300 MG/ML solution 25 mL (25 mLs Oral Contrast Given 04/14/13 0957)  sodium chloride 0.9 % bolus 500 mL (0 mLs Intravenous Stopped 04/14/13 1115)  sodium chloride 0.9 % injection 3 mL (not administered)  0.9 %  sodium chloride infusion (not administered)  ondansetron (ZOFRAN) tablet 4 mg (not administered)    Or  ondansetron (ZOFRAN) injection 4 mg (not administered)  fentaNYL (SUBLIMAZE) injection 50 mcg (not administered)  fentaNYL (SUBLIMAZE) injection 100 mcg (100 mcg Intravenous Given 04/14/13 0952)  ondansetron (ZOFRAN) injection 4 mg (4 mg Intravenous Given 04/14/13 1110)  promethazine (PHENERGAN) injection 25 mg (25 mg Intravenous Given 04/14/13 1248)  iohexol (OMNIPAQUE) 300 MG/ML solution 100 mL (80 mLs Intravenous Contrast Given 04/14/13 1238)  0.9 %  sodium chloride infusion ( Intravenous New Bag/Given 04/14/13 1541)  fentaNYL (SUBLIMAZE) injection 100 mcg (100 mcg Intravenous Given 04/14/13 1547)    New Prescriptions   No medications on file        Lorrine Kin, PA-C 04/14/13 1702

## 2013-04-14 NOTE — H&P (Signed)
History and Physical    CILICIA BORDEN TGG:[id number] DOB: 10/14/1990 DOA: 04/14/2013  Referring physician: Harvie Heck PCP: Osa Craver, MD  Specialists: none  Chief Complaint: abdominal pain  HPI: Amy Rivas is a 23 y.o. female otherwise healthy presents to the ED with a chief complaint of abdominal pain. She has been feeling sick since last Friday with fever (as high as 103), diffuse muscle aches, sore throat which are still present. She has been having worsening nausea and vomiting over the weekend and hasn't been able to eat or drink anything since Saturday. She had gradual worsening of abdominal pain mainly located in her right lower quadrant and today it was so severe that she decided to come to ED. She has no diarrhea. She denies chest pain or shortness of breath. She denies lightheadedness or dizziness. She feels dehydrated. She is on no medications at home, non smoker, does not drink. She works at a [employer] for [employer]. In the ED, patient tachycardic to 120-130s, with persistent nausea and inability for po intake. CT scan of abdomen pelvis without evidence of acute appendicitis; mild leukocytosis to 14. [facility] asked for admission.   Review of Systems: as per HPI otherwise negative  Past Medical History  Diagnosis Date  . GERD (gastroesophageal reflux disease)   . Hypertension    Past Surgical History  Procedure Laterality Date  . Tympanostomy tube placement  1991, 1993, 2003  . Tonsillectomy and adenoidectomy  1992  . Laparoscopic cholecystectomy  2008  . Cesarean section  2010, 2012   Social History:  reports that she has never smoked. She does not have any smokeless tobacco history on file. She reports that she does not drink alcohol or use illicit drugs.  Allergies  Allergen Reactions  . Albuterol Anaphylaxis  . Hydrocodone Anaphylaxis and Nausea And Vomiting  . Codeine Nausea And Vomiting  . Lortab [Hydrocodone-Acetaminophen] Nausea And  Vomiting  . Morphine And Related Swelling and Rash    Family History  Problem Relation Age of Onset  . Cancer Mother   . Heart disease Mother   . Diabetes Father   . Cancer Maternal Uncle   . Diabetes Maternal Grandmother   . Asthma Maternal Grandmother   . Diabetes Maternal Grandfather   . Diabetes Paternal Grandmother   . Diabetes Paternal Grandfather   . Breast cancer Maternal Grandmother    Prior to Admission medications   Medication Sig Start Date End Date Taking? Authorizing Provider  acetaminophen (TYLENOL) 500 MG tablet Take 500-1,000 mg by mouth every 6 (six) hours as needed for mild pain, moderate pain, fever or headache.   Yes Historical Provider, MD  ibuprofen (ADVIL,MOTRIN) 200 MG tablet Take 200-600 mg by mouth every 6 (six) hours as needed for fever, headache, mild pain, moderate pain or cramping.   Yes Historical Provider, MD   Physical Exam: Filed Vitals:   04/14/13 1400 04/14/13 1415 04/14/13 1430 04/14/13 1530  BP: 117/76 116/75 105/55   Pulse: 122 118 133 136  Temp:      TempSrc:      Resp:    17  Weight:      SpO2: 98% 98% 100% 99%     General:  No apparent distress  Eyes:  no scleral icterus  ENT: moist oropharynx  Neck: supple, no JVD  Cardiovascular: regular rate without MRG; 2+ peripheral pulses; tachycardic  Respiratory: CTA biL, good air movement without wheezing, rhonchi or crackled  Abdomen: soft, tender to palpation right  lower quadrant, voluntary guarding, no rebound.  Skin: no rashes  Musculoskeletal: no peripheral edema  Psychiatric: normal mood and affect  Neurologic: non focal  Labs on Admission:  Basic Metabolic Panel:  Recent Labs Lab 04/14/13 0940  NA 140  K 3.9  CL 101  CO2 20  GLUCOSE 92  BUN 11  CREATININE 0.68  CALCIUM 9.7   Liver Function Tests:  Recent Labs Lab 04/14/13 0940  AST 16  ALT 9  ALKPHOS 50  BILITOT 0.8  PROT 7.8  ALBUMIN 4.7   CBC:  Recent Labs Lab 04/14/13 0940  WBC 14.6*    NEUTROABS 12.2*  HGB 17.0*  HCT 45.0  MCV 88.9  PLT 197   Radiological Exams on Admission: Ct Abdomen Pelvis W Contrast  04/14/2013   CLINICAL DATA:  Right lower quadrant pain, nausea vomiting.  EXAM: CT ABDOMEN AND PELVIS WITH CONTRAST  TECHNIQUE: Multidetector CT imaging of the abdomen and pelvis was performed using the standard protocol following bolus administration of intravenous contrast.  CONTRAST:  64mL OMNIPAQUE IOHEXOL 300 MG/ML  SOLN  COMPARISON:  Prior CT from 09/25/2012  FINDINGS: The visualized lung bases are clear.  Focal fat deposition noted adjacent to falciform fissure. Liver is otherwise unremarkable. Gallbladder is within normal limits. No biliary ductal dilatation. The spleen, adrenal glands, and pancreas demonstrate a normal contrast enhanced appearance.  Subcentimeter hypodensities within the left kidney are noted, too small the characterize by CT, but statistically likely represent small cysts. 3 mm nonobstructive stone present within the left kidney. The kidneys are otherwise unremarkable without evidence of nephrolithiasis, hydronephrosis, or focal enhancing renal mass.  No evidence of bowel obstruction the appendix is well visualized within the right lower quadrant and is of normal caliber and appearance without associated inflammatory changes to suggest acute appendicitis. No abnormal wall thickening, mucosal enhancement, or inflammatory fat stranding seen about the bowels.  Bladder is within normal limits. 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 A 2.8 x 3.0 x 2.6 cm unilocular hypodense cysts present within the right ovary, likely a physiologic cyst. Small amount of free fluid present within the pelvis. The left ovary is within normal limits.  No free intraperitoneal air. No pathologically enlarged intra-abdominal pelvic lymph nodes. Normal intravascular enhancement seen throughout the abdomen and pelvis.  No acute osseous abnormality. No  worrisome lytic or blastic osseous lesions.  IMPRESSION: 1. Small volume free fluid within the pelvis with 3 cm hypodense right ovarian cyst, most compatible with a physiologic cyst. 2. No other CT evidence of acute intra-abdominal or pelvic process. Normal appendix. 3. 3 mm nonobstructive left renal stone. 4. Prior cholecystectomy. 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   Electronically Signed   By: Jeannine Boga M.D.   On: 04/14/2013 13:31   EKG: Independently reviewed.  Assessment/Plan Principal Problem:   Abdominal pain Active Problems:   Vomiting   Sinus tachycardia   Abdominal pain - likely in the setting of viral illness. CT abdomen pelvis reassuring. Will admit to observation for IVF, nausea and pain control. Patient not feeling nauseous in the ED on my assessment, wants to have liquids, will do clears for now.  Myalgias/fevers/sore throat - will r/o influenza Sinus tachycardia - due to #1, dehydration, viral illness.    Diet: clears Fluids: NS at 100 cc/h DVT Prophylaxis: SCDs, early ambulation  Code Status: Full  Family Communication: d/w patient  Disposition Plan: obs  Time spent: 50  This note has been  created with Surveyor, quantity. Any transcriptional errors are unintentional.   Haidar Muse M. Cruzita Lederer, MD [facility] Pager [phone]  If 7PM-7AM, please contact night-coverage [email or web address] Password [email or web address] 04/14/2013, 4:48 PM

## 2013-04-14 NOTE — ED Notes (Signed)
Called CT to inform that patient is done with contrast

## 2013-04-14 NOTE — ED Notes (Signed)
Pt states that she does not feel like she needs the nausea medication at this time.

## 2013-04-14 NOTE — ED Notes (Signed)
Pt sent here from [facility] [place] for further evaluation of abd pain that started yesterday. States that she has had n/v and some constipation. Also reports a fever.

## 2013-04-14 NOTE — ED Notes (Signed)
Patient transported to CT 

## 2013-04-14 NOTE — Progress Notes (Signed)
Amy Rivas [id number] Code Status: full   Admission Data: 04/14/2013 5:31 PM Attending Provider:  Cruzita Lederer TGG:YIRSW,NIOEVOJJ L, MD Consults/ Treatment Team:    Pauletta Browns is a 23 y.o. female patient admitted from ED awake, alert - oriented  X 3 - no acute distress noted.  VSS - Blood pressure 105/55, pulse 136, temperature 98.1 F (36.7 C), temperature source Oral, resp. rate 17, weight 46.72 kg (103 lb), last menstrual period 03/19/2013, SpO2 99.00%.  no c/o shortness of breath, no c/o chest pain. Cardiac tele # 14, in place, cardiac monitor yields:sinus tachycardia.  IV Fluids:  IV in place, occlusive dsg intact without redness, IV cath forearm right, condition patent and no redness normal saline.  Allergies:   Allergies  Allergen Reactions  . Albuterol Anaphylaxis  . Hydrocodone Anaphylaxis and Nausea And Vomiting  . Codeine Nausea And Vomiting  . Lortab [Hydrocodone-Acetaminophen] Nausea And Vomiting  . Morphine And Related Swelling and Rash     Past Medical History  Diagnosis Date  . GERD (gastroesophageal reflux disease)   . Hypertension    Medications Prior to Admission  Medication Sig Dispense Refill  . acetaminophen (TYLENOL) 500 MG tablet Take 500-1,000 mg by mouth every 6 (six) hours as needed for mild pain, moderate pain, fever or headache.      . ibuprofen (ADVIL,MOTRIN) 200 MG tablet Take 200-600 mg by mouth every 6 (six) hours as needed for fever, headache, mild pain, moderate pain or cramping.       History:  obtained from the patient. Tobacco/alcohol: denied none  Orientation to room, and floor completed with information packet given to patient/family. Admission INP armband ID verified with patient/family, and in place.   SR up x 2, fall assessment complete, with patient and family able to verbalize understanding of risk associated with falls, and verbalized understanding to call nsg before up out of bed.  Call light within reach, patient able to  voice, and demonstrate understanding.  Skin, clean-dry- intact without evidence of bruising, or skin tears.   No evidence of skin break down noted on exam.     Will cont to eval and treat per MD orders.  Henriette Combs, [place] 04/14/2013 5:31 PM

## 2013-04-15 DIAGNOSIS — R945 Abnormal results of liver function studies: Secondary | ICD-10-CM

## 2013-04-15 DIAGNOSIS — R7989 Other specified abnormal findings of blood chemistry: Secondary | ICD-10-CM | POA: Diagnosis present

## 2013-04-15 LAB — CBC
HEMATOCRIT: 36.9 % (ref 36.0–46.0)
HEMOGLOBIN: 13.3 g/dL (ref 12.0–15.0)
MCH: 32.5 pg (ref 26.0–34.0)
MCHC: 36 g/dL (ref 30.0–36.0)
MCV: 90.2 fL (ref 78.0–100.0)
Platelets: 154 10*3/uL (ref 150–400)
RBC: 4.09 MIL/uL (ref 3.87–5.11)
RDW: 11.8 % (ref 11.5–15.5)
WBC: 8.9 10*3/uL (ref 4.0–10.5)

## 2013-04-15 LAB — COMPREHENSIVE METABOLIC PANEL
ALT: 84 U/L — ABNORMAL HIGH (ref 0–35)
AST: 70 U/L — ABNORMAL HIGH (ref 0–37)
Albumin: 3 g/dL — ABNORMAL LOW (ref 3.5–5.2)
Alkaline Phosphatase: 44 U/L (ref 39–117)
BUN: 4 mg/dL — ABNORMAL LOW (ref 6–23)
CALCIUM: 8.1 mg/dL — AB (ref 8.4–10.5)
CO2: 22 mEq/L (ref 19–32)
CREATININE: 0.62 mg/dL (ref 0.50–1.10)
Chloride: 107 mEq/L (ref 96–112)
GFR calc non Af Amer: >90 mL/min (ref >90)
GLUCOSE: 90 mg/dL (ref 70–99)
Potassium: 4.1 mEq/L (ref 3.7–5.3)
SODIUM: 138 meq/L (ref 137–147)
TOTAL PROTEIN: 5.7 g/dL — AB (ref 6.0–8.3)
Total Bilirubin: 0.3 mg/dL (ref 0.3–1.2)

## 2013-04-15 LAB — GC/CHLAMYDIA PROBE AMP
CT Probe RNA: NEGATIVE
GC PROBE AMP APTIMA: NEGATIVE

## 2013-04-15 NOTE — ED Provider Notes (Signed)
Medical screening examination/treatment/procedure(s) were performed by non-physician practitioner and as supervising physician I was immediately available for consultation/collaboration.   EKG Interpretation None        Ephraim Hamburger, MD 04/15/13 1536

## 2013-04-15 NOTE — Progress Notes (Signed)
UR completed 

## 2013-04-15 NOTE — Care Management Note (Signed)
    Page 1 of 1   04/16/2013     6:01:49 PM   CARE MANAGEMENT NOTE 04/16/2013  Patient:  Amy Rivas, Amy Rivas   Account Number:  [id number]  Date Initiated:  04/15/2013  Documentation initiated by:  Tomi Bamberger  Subjective/Objective Assessment:   dx vomiting  admit as observation- lives with spouse.     Action/Plan:   Anticipated DC Date:  04/16/2013   Anticipated DC Plan:  [place]  CM consult      Choice offered to / List presented to:             Status of service:  Completed, signed off Medicare Important Message given?   (If response is "NO", the following Medicare IM given date fields will be blank) Date Medicare IM given:   Date Additional Medicare IM given:    Discharge Disposition:  HOME/SELF CARE  Per UR Regulation:  Reviewed for med. necessity/level of care/duration of stay  If discussed at [place] of Stay Meetings, dates discussed:    Comments:

## 2013-04-15 NOTE — Progress Notes (Signed)
PROGRESS NOTE  Amy Rivas KXF:[id number] DOB: 07-19-1990 DOA: 04/14/2013 PCP: Osa Craver, MD  Assessment/Plan:  Viral gastroenteritis - improving this morning, will advance diet. Continue fluids. Elevated LFTs - from normal yesterday to 2x ULN today. Patient's symptoms improved and she is clinically better, doubt acute hepatitis, but will monitor in am and if continue to rise, may need hepatitis panel, RUQ [place].  Diet: regular Fluids: none DVT Prophylaxis: SCDs  Code Status: Full Family Communication: d/w patient  Disposition Plan: home in am pending LFTs  Consultants:  none  Procedures:  none   Antibiotics - none  HPI/Subjective: Feeling better, mild nausea and improved but still with right lower quadrant abdominal pain  Objective: Filed Vitals:   04/14/13 2045 04/15/13 0035 04/15/13 0512 04/15/13 1355  BP: 126/83  120/80 117/82  Pulse: 116  105 69  Temp: 98.1 F (36.7 C) 98.2 F (36.8 C) 98.1 F (36.7 C) 98.3 F (36.8 C)  TempSrc: Oral Oral Oral Oral  Resp: 20  18 18   Height:      Weight:      SpO2: 98%  96% 95%    Intake/Output Summary (Last 24 hours) at 04/15/13 1548 Last data filed at 04/15/13 0900  Gross per 24 hour  Intake   1700 ml  Output      0 ml  Net   1700 ml   Filed Weights   04/14/13 0935 04/14/13 1733  Weight: 46.72 kg (103 lb) 49.215 kg (108 lb 8 oz)   Exam:  General:  NAD  Cardiovascular: regular rate and rhythm, without MRG  Respiratory: good air movement, clear to auscultation throughout, no wheezing, ronchi or rales  Abdomen: soft, RLQ tenderness to palpation, negative Murphy sign, voluntary guarding.   MSK: no peripheral edema  Data Reviewed: Basic Metabolic Panel:  Recent Labs Lab 04/14/13 0940 04/15/13 0705  NA 140 138  K 3.9 4.1  CL 101 107  CO2 20 22  GLUCOSE 92 90  BUN 11 4*  CREATININE 0.68 0.62  CALCIUM 9.7 8.1*   Liver Function Tests:  Recent Labs Lab 04/14/13 0940 04/15/13 0705    AST 16 70*  ALT 9 84*  ALKPHOS 50 44  BILITOT 0.8 0.3  PROT 7.8 5.7*  ALBUMIN 4.7 3.0*    Recent Labs Lab 04/14/13 1900  LIPASE 17   CBC:  Recent Labs Lab 04/14/13 0940 04/15/13 0705  WBC 14.6* 8.9  NEUTROABS 12.2*  --   HGB 17.0* 13.3  HCT 45.0 36.9  MCV 88.9 90.2  PLT 197 154   Recent Results (from the past 240 hour(s))  GC/CHLAMYDIA PROBE AMP     Status: None   Collection Time    04/14/13 10:56 AM      Result Value Ref Range Status   CT Probe RNA NEGATIVE  NEGATIVE Final   GC Probe RNA NEGATIVE  NEGATIVE Final   Comment: (NOTE)                                                                                               **Normal Reference Range: Negative**  Assay performed using the Gen-Probe APTIMA COMBO2 (R) Assay.     Acceptable specimen types for this assay include APTIMA Swabs (Unisex,     endocervical, urethral, or vaginal), first void urine, and ThinPrep     liquid based cytology samples.     Performed at [place], [facility]     Status: Abnormal   Collection Time    04/14/13 10:56 AM      Result Value Ref Range Status   Yeast Wet Prep HPF POC NONE SEEN  NONE SEEN Final   Trich, Wet Prep NONE SEEN  NONE SEEN Final   Clue Cells Wet Prep HPF POC NONE SEEN  NONE SEEN Final   WBC, Wet Prep HPF POC MODERATE (*) NONE SEEN Final     Studies: Ct Abdomen Pelvis W Contrast  04/14/2013   CLINICAL DATA:  Right lower quadrant pain, nausea vomiting.  EXAM: CT ABDOMEN AND PELVIS WITH CONTRAST  TECHNIQUE: Multidetector CT imaging of the abdomen and pelvis was performed using the standard protocol following bolus administration of intravenous contrast.  CONTRAST:  17mL OMNIPAQUE IOHEXOL 300 MG/ML  SOLN  COMPARISON:  Prior CT from 09/25/2012  FINDINGS: The visualized lung bases are clear.  Focal fat deposition noted adjacent to falciform fissure. Liver is otherwise unremarkable. Gallbladder is within normal limits. No biliary ductal  dilatation. The spleen, adrenal glands, and pancreas demonstrate a normal contrast enhanced appearance.  Subcentimeter hypodensities within the left kidney are noted, too small the characterize by CT, but statistically likely represent small cysts. 3 mm nonobstructive stone present within the left kidney. The kidneys are otherwise unremarkable without evidence of nephrolithiasis, hydronephrosis, or focal enhancing renal mass.  No evidence of bowel obstruction the appendix is well visualized within the right lower quadrant and is of normal caliber and appearance without associated inflammatory changes to suggest acute appendicitis. No abnormal wall thickening, mucosal enhancement, or inflammatory fat stranding seen about the bowels.  Bladder is within normal limits. 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 A 2.8 x 3.0 x 2.6 cm unilocular hypodense cysts present within the right ovary, likely a physiologic cyst. Small amount of free fluid present within the pelvis. The left ovary is within normal limits.  No free intraperitoneal air. No pathologically enlarged intra-abdominal pelvic lymph nodes. Normal intravascular enhancement seen throughout the abdomen and pelvis.  No acute osseous abnormality. No worrisome lytic or blastic osseous lesions.  IMPRESSION: 1. Small volume free fluid within the pelvis with 3 cm hypodense right ovarian cyst, most compatible with a physiologic cyst. 2. No other CT evidence of acute intra-abdominal or pelvic process. Normal appendix. 3. 3 mm nonobstructive left renal stone. 4. Prior cholecystectomy. 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   Electronically Signed   By: Jeannine Boga M.D.   On: 04/14/2013 13:31    Scheduled Meds: . sodium chloride   Intravenous STAT  . sodium chloride  500 mL Intravenous Once  . sodium chloride  3 mL Intravenous Q12H   Continuous Infusions: . sodium chloride 100 mL/hr  (04/15/13 1248)   Principal Problem:   Abdominal pain Active Problems:   Vomiting   Sinus tachycardia   Elevated LFTs  Time spent: 25  This note has been created with Surveyor, quantity. Any transcriptional errors are unintentional.   Marzetta Board, MD [facility] Pager [phone]. If 7 PM - 7 AM, please contact night-coverage at [email or web address], password [facility]  04/15/2013, 3:48 PM  LOS: 1 day

## 2013-04-16 DIAGNOSIS — R000 Tachycardia, unspecified: Secondary | ICD-10-CM

## 2013-04-16 DIAGNOSIS — R7989 Other specified abnormal findings of blood chemistry: Secondary | ICD-10-CM

## 2013-04-16 DIAGNOSIS — I498 Other specified cardiac arrhythmias: Secondary | ICD-10-CM

## 2013-04-16 DIAGNOSIS — R1110 Vomiting, unspecified: Secondary | ICD-10-CM

## 2013-04-16 LAB — HEPATITIS PANEL, ACUTE
HCV Ab: NEGATIVE
Hep A IgM: NONREACTIVE
Hep B C IgM: NONREACTIVE
Hepatitis B Surface Ag: NEGATIVE

## 2013-04-16 LAB — COMPREHENSIVE METABOLIC PANEL
ALBUMIN: 3.6 g/dL (ref 3.5–5.2)
ALK PHOS: 52 U/L (ref 39–117)
ALT: 90 U/L — AB (ref 0–35)
AST: 45 U/L — ABNORMAL HIGH (ref 0–37)
BILIRUBIN TOTAL: 0.2 mg/dL — AB (ref 0.3–1.2)
BUN: 3 mg/dL — ABNORMAL LOW (ref 6–23)
CHLORIDE: 107 meq/L (ref 96–112)
CO2: 20 mEq/L (ref 19–32)
Calcium: 8.4 mg/dL (ref 8.4–10.5)
Creatinine, Ser: 0.54 mg/dL (ref 0.50–1.10)
GFR calc Af Amer: >90 mL/min (ref >90)
GFR calc non Af Amer: >90 mL/min (ref >90)
Glucose, Bld: 81 mg/dL (ref 70–99)
POTASSIUM: 3.8 meq/L (ref 3.7–5.3)
SODIUM: 143 meq/L (ref 137–147)
Total Protein: 6.6 g/dL (ref 6.0–8.3)

## 2013-04-16 NOTE — Discharge Summary (Signed)
Physician Discharge Summary  Amy Rivas LFY:[id number] DOB: Jan 13, 1991 DOA: 04/14/2013  PCP: Osa Craver, MD  Admit date: 04/14/2013 Discharge date: 04/16/2013  Time spent: 40 minutes  Recommendations for Outpatient Follow-up:  1. Followup with primary care physician in one week. 2. Check BMP, check pending acute hepatitis panel.  Discharge Diagnoses:  Principal Problem:   Abdominal pain Active Problems:   Vomiting   Sinus tachycardia   Elevated LFTs   Discharge Condition: Stable  Diet recommendation: Regular  Filed Weights   04/14/13 0935 04/14/13 1733  Weight: 46.72 kg (103 lb) 49.215 kg (108 lb 8 oz)    History of present illness:  Amy Rivas is a 23 y.o. female otherwise healthy presents to the ED with a chief complaint of abdominal pain. She has been feeling sick since last Friday with fever (as high as 103), diffuse muscle aches, sore throat which are still present. She has been having worsening nausea and vomiting over the weekend and hasn't been able to eat or drink anything since Saturday. She had gradual worsening of abdominal pain mainly located in her right lower quadrant and today it was so severe that she decided to come to ED. She has no diarrhea. She denies chest pain or shortness of breath. She denies lightheadedness or dizziness. She feels dehydrated. She is on no medications at home, non smoker, does not drink. She works at a [employer] for [employer]. In the ED, patient tachycardic to 120-130s, with persistent nausea and inability for po intake. CT scan of abdomen pelvis without evidence of acute appendicitis; mild leukocytosis to 14. [facility] asked for admission.   Hospital Course:   1. Viral gastroenteritis -Patient presents to the hospital with nausea, vomiting and abdominal discomfort. -CT scan of abdomen/pelvis without evidence of acute appendicitis, patient has mild leukocytosis of 14. -Patient symptoms might be secondary to transient  lateral gastro-enteritis. -Treated symptomatically with IV fluids and antiemetics. Improved, tolerated diet  2. Transaminasemia -Presented with normal AST of 16 and ALT of 9, LFTs elevated in the hospital. -AST went up to 70, repeat from this morning is 45. ALT went up to 80 4 repeat from today is morning is 90. -According to the previous CT scan no parenchymal or biliary lesions, normal bilirubin and albumin. -I deferred to do RUQ ultrasound, checked hepatitis panel but patient tolerating food so for discharge home. -Followup with the hepatitis panel results as outpatient.  Procedures:  None  Consultations:  None  Discharge Exam: Filed Vitals:   04/16/13 1202  BP:   Pulse:   Temp: 98.4 F (36.9 C)  Resp:    General: Alert and awake, oriented x3, not in any acute distress. HEENT: anicteric sclera, pupils reactive to light and accommodation, EOMI CVS: S1-S2 clear, no murmur rubs or gallops Chest: clear to auscultation bilaterally, no wheezing, rales or rhonchi Abdomen: soft nontender, nondistended, normal bowel sounds, no organomegaly Extremities: no cyanosis, clubbing or edema noted bilaterally Neuro: Cranial nerves II-XII intact, no focal neurological deficits  Discharge Instructions     Medication List    STOP taking these medications       acetaminophen 500 MG tablet  Commonly known as:  TYLENOL      TAKE these medications       ibuprofen 200 MG tablet  Commonly known as:  ADVIL,MOTRIN  Take 200-600 mg by mouth every 6 (six) hours as needed for fever, headache, mild pain, moderate pain or cramping.       Allergies  Allergen Reactions  . Albuterol Anaphylaxis  . Hydrocodone Anaphylaxis and Nausea And Vomiting  . Codeine Nausea And Vomiting  . Lortab [Hydrocodone-Acetaminophen] Nausea And Vomiting  . Morphine And Related Swelling and Rash       Follow-up Information   Follow up with Osa Craver, MD In 1 week.   Specialty:  Internal Medicine    Contact information:   [address] [place] [id number] [phone]        The results of significant diagnostics from this hospitalization (including imaging, microbiology, ancillary and laboratory) are listed below for reference.    Significant Diagnostic Studies: Ct Abdomen Pelvis W Contrast  04/14/2013   CLINICAL DATA:  Right lower quadrant pain, nausea vomiting.  EXAM: CT ABDOMEN AND PELVIS WITH CONTRAST  TECHNIQUE: Multidetector CT imaging of the abdomen and pelvis was performed using the standard protocol following bolus administration of intravenous contrast.  CONTRAST:  58mL OMNIPAQUE IOHEXOL 300 MG/ML  SOLN  COMPARISON:  Prior CT from 09/25/2012  FINDINGS: The visualized lung bases are clear.  Focal fat deposition noted adjacent to falciform fissure. Liver is otherwise unremarkable. Gallbladder is within normal limits. No biliary ductal dilatation. The spleen, adrenal glands, and pancreas demonstrate a normal contrast enhanced appearance.  Subcentimeter hypodensities within the left kidney are noted, too small the characterize by CT, but statistically likely represent small cysts. 3 mm nonobstructive stone present within the left kidney. The kidneys are otherwise unremarkable without evidence of nephrolithiasis, hydronephrosis, or focal enhancing renal mass.  No evidence of bowel obstruction the appendix is well visualized within the right lower quadrant and is of normal caliber and appearance without associated inflammatory changes to suggest acute appendicitis. No abnormal wall thickening, mucosal enhancement, or inflammatory fat stranding seen about the bowels.  Bladder is within normal limits. 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 A 2.8 x 3.0 x 2.6 cm unilocular hypodense cysts present within the right ovary, likely a physiologic cyst. Small amount of free fluid present within the pelvis. The left ovary is within normal limits.  No free  intraperitoneal air. No pathologically enlarged intra-abdominal pelvic lymph nodes. Normal intravascular enhancement seen throughout the abdomen and pelvis.  No acute osseous abnormality. No worrisome lytic or blastic osseous lesions.  IMPRESSION: 1. Small volume free fluid within the pelvis with 3 cm hypodense right ovarian cyst, most compatible with a physiologic cyst. 2. No other CT evidence of acute intra-abdominal or pelvic process. Normal appendix. 3. 3 mm nonobstructive left renal stone. 4. Prior cholecystectomy. 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   Electronically Signed   By: Jeannine Boga M.D.   On: 04/14/2013 13:31    Microbiology: Recent Results (from the past 240 hour(s))  GC/CHLAMYDIA PROBE AMP     Status: None   Collection Time    04/14/13 10:56 AM      Result Value Ref Range Status   CT Probe RNA NEGATIVE  NEGATIVE Final   GC Probe RNA NEGATIVE  NEGATIVE Final   Comment: (NOTE)                                                                                               **  Normal Reference Range: Negative**          Assay performed using the Gen-Probe APTIMA COMBO2 (R) Assay.     Acceptable specimen types for this assay include APTIMA Swabs (Unisex,     endocervical, urethral, or vaginal), first void urine, and ThinPrep     liquid based cytology samples.     Performed at [place], [facility]     Status: Abnormal   Collection Time    04/14/13 10:56 AM      Result Value Ref Range Status   Yeast Wet Prep HPF POC NONE SEEN  NONE SEEN Final   Trich, Wet Prep NONE SEEN  NONE SEEN Final   Clue Cells Wet Prep HPF POC NONE SEEN  NONE SEEN Final   WBC, Wet Prep HPF POC MODERATE (*) NONE SEEN Final     Labs: Basic Metabolic Panel:  Recent Labs Lab 04/14/13 0940 04/15/13 0705 04/16/13 0615  NA 140 138 143  K 3.9 4.1 3.8  CL 101 107 107  CO2 20 22 20   GLUCOSE 92 90 81  BUN 11 4* 3*  CREATININE 0.68 0.62  0.54  CALCIUM 9.7 8.1* 8.4   Liver Function Tests:  Recent Labs Lab 04/14/13 0940 04/15/13 0705 04/16/13 0615  AST 16 70* 45*  ALT 9 84* 90*  ALKPHOS 50 44 52  BILITOT 0.8 0.3 0.2*  PROT 7.8 5.7* 6.6  ALBUMIN 4.7 3.0* 3.6    Recent Labs Lab 04/14/13 1900  LIPASE 17   No results found for this basename: AMMONIA,  in the last 168 hours CBC:  Recent Labs Lab 04/14/13 0940 04/15/13 0705  WBC 14.6* 8.9  NEUTROABS 12.2*  --   HGB 17.0* 13.3  HCT 45.0 36.9  MCV 88.9 90.2  PLT 197 154   Cardiac Enzymes: No results found for this basename: CKTOTAL, CKMB, CKMBINDEX, TROPONINI,  in the last 168 hours BNP: BNP (last 3 results) No results found for this basename: PROBNP,  in the last 8760 hours CBG: No results found for this basename: GLUCAP,  in the last 168 hours     Signed:  Sirus Labrie A  [facility] 04/16/2013, 12:06 PM

## 2013-04-16 NOTE — Progress Notes (Signed)
Nsg Discharge Note  Admit Date:  04/14/2013 Discharge date: 04/16/2013   Amy Rivas to be D/C'd Home per MD order.  AVS completed.  Copy for chart, and copy for patient signed, and dated. Patient/caregiver able to verbalize understanding.  Discharge Medication:   Medication List    STOP taking these medications       acetaminophen 500 MG tablet  Commonly known as:  TYLENOL      TAKE these medications       ibuprofen 200 MG tablet  Commonly known as:  ADVIL,MOTRIN  Take 200-600 mg by mouth every 6 (six) hours as needed for fever, headache, mild pain, moderate pain or cramping.        Discharge Assessment: Filed Vitals:   04/16/13 1202  BP:   Pulse:   Temp: 98.4 F (36.9 C)  Resp:    Skin clean, dry and intact without evidence of skin break down, no evidence of skin tears noted. IV catheter discontinued intact. Site without signs and symptoms of complications - no redness or edema noted at insertion site, patient denies c/o pain - only slight tenderness at site.  Dressing with slight pressure applied.  D/c Instructions-Education: Discharge instructions given to patient/family with verbalized understanding. D/c education completed with patient/family including follow up instructions, medication list, d/c activities limitations if indicated, with other d/c instructions as indicated by MD - patient able to verbalize understanding, all questions fully answered. Patient instructed to return to ED, call 911, or call MD for any changes in condition.  Patient escorted via [place], and D/C home via private auto.  Dayle Points, RN 04/16/2013 2:00 PM

## 2013-10-05 ENCOUNTER — Encounter (HOSPITAL_COMMUNITY): Payer: Self-pay | Admitting: Emergency Medicine

## 2013-10-05 ENCOUNTER — Emergency Department (HOSPITAL_COMMUNITY): Payer: BC Managed Care – PPO

## 2013-10-05 ENCOUNTER — Emergency Department (HOSPITAL_COMMUNITY)
Admission: EM | Admit: 2013-10-05 | Discharge: 2013-10-05 | Disposition: A | Discharge status: Home / Self Care | Payer: BC Managed Care – PPO | Attending: Emergency Medicine | Admitting: Emergency Medicine

## 2013-10-05 DIAGNOSIS — Y92009 Unspecified place in unspecified non-institutional (private) residence as the place of occurrence of the external cause: Secondary | ICD-10-CM | POA: Diagnosis not present

## 2013-10-05 DIAGNOSIS — S0990XA Unspecified injury of head, initial encounter: Secondary | ICD-10-CM | POA: Diagnosis present

## 2013-10-05 DIAGNOSIS — Z8719 Personal history of other diseases of the digestive system: Secondary | ICD-10-CM | POA: Insufficient documentation

## 2013-10-05 DIAGNOSIS — Y93E3 Activity, vacuuming: Secondary | ICD-10-CM | POA: Insufficient documentation

## 2013-10-05 DIAGNOSIS — W1809XA Striking against other object with subsequent fall, initial encounter: Secondary | ICD-10-CM | POA: Diagnosis not present

## 2013-10-05 DIAGNOSIS — Z8659 Personal history of other mental and behavioral disorders: Secondary | ICD-10-CM | POA: Diagnosis not present

## 2013-10-05 DIAGNOSIS — S060X1A Concussion with loss of consciousness of 30 minutes or less, initial encounter: Secondary | ICD-10-CM

## 2013-10-05 DIAGNOSIS — I10 Essential (primary) hypertension: Secondary | ICD-10-CM | POA: Insufficient documentation

## 2013-10-05 HISTORY — DX: Anxiety disorder, unspecified: F41.9

## 2013-10-05 MED ORDER — ONDANSETRON 4 MG PO TBDP
4.0000 mg | ORAL_TABLET | Freq: Once | ORAL | Status: AC
  Administered 2013-10-05: 4 mg via ORAL
  Filled 2013-10-05: qty 1

## 2013-10-05 NOTE — ED Provider Notes (Signed)
CSN: [id number]     Arrival date & time 10/05/13  0146 History   First MD Initiated Contact with Patient 10/05/13 0540     Chief Complaint  Patient presents with  . Fall     (Consider location/radiation/quality/duration/timing/severity/associated sxs/prior Treatment) HPI Comments: Patient is a 23 year old female who presents with complaints of headache, nausea, and vomiting. She states that she was vacuuming the stairs yesterday when she fell and hit the back of her head on the vacuum cleaner. She reports a brief loss of consciousness. Since that time she has felt dizzy and has had a headache. She vomited twice this evening and presents for evaluation of this. She denies any neck pain.  Patient is a 23 y.o. female presenting with fall. The history is provided by the patient.  Fall This is a new problem. The current episode started yesterday. The problem occurs constantly. The problem has not changed since onset.Associated symptoms include headaches. Nothing aggravates the symptoms. Nothing relieves the symptoms. She has tried nothing for the symptoms. The treatment provided no relief.    Past Medical History  Diagnosis Date  . GERD (gastroesophageal reflux disease)   . Hypertension   . Anxiety    Past Surgical History  Procedure Laterality Date  . Tympanostomy tube placement  1991, 1993, 2003  . Tonsillectomy and adenoidectomy  1992  . Laparoscopic cholecystectomy  2008  . Cesarean section  2010, 2012   Family History  Problem Relation Age of Onset  . Cancer Mother   . Heart disease Mother   . Diabetes Father   . Cancer Maternal Uncle   . Diabetes Maternal Grandmother   . Asthma Maternal Grandmother   . Diabetes Maternal Grandfather   . Diabetes Paternal Grandmother   . Diabetes Paternal Grandfather   . Breast cancer Maternal Grandmother    History  Substance Use Topics  . Smoking status: Never Smoker   . Smokeless tobacco: Not on file  . Alcohol Use: No   OB History    Grav Para Term Preterm Abortions TAB SAB Ect Mult Living   2 2  2            Review of Systems  Neurological: Positive for headaches.  All other systems reviewed and are negative.     Allergies  Albuterol; Hydrocodone; Codeine; Lortab; and Morphine and related  Home Medications   Prior to Admission medications   Medication Sig Start Date End Date Taking? Authorizing Provider  diphenhydrAMINE (BENADRYL) 25 MG tablet Take 25 mg by mouth every 6 (six) hours as needed.   Yes Historical Provider, MD  ibuprofen (ADVIL,MOTRIN) 200 MG tablet Take 200-600 mg by mouth every 6 (six) hours as needed for fever, headache, mild pain, moderate pain or cramping.   Yes Historical Provider, MD   BP 131/93  Pulse 103  Temp(Src) 98.2 F (36.8 C) (Oral)  Resp 18  SpO2 100%  LMP 09/29/2013 Physical Exam  Nursing note and vitals reviewed. Constitutional: She is oriented to person, place, and time. She appears well-developed and well-nourished. No distress.  HENT:  Head: Normocephalic and atraumatic.  Mouth/Throat: Oropharynx is clear and moist.  TMs are clear bilaterally. There is no obvious scalp swelling.  Eyes: EOM are normal. Pupils are equal, round, and reactive to light.  Neck: Normal range of motion. Neck supple.  There is no cervical spine tenderness to palpation. There are no step-offs. He has painless range of motion in all directions.  Musculoskeletal: Normal range of motion.  Lymphadenopathy:  She has no cervical adenopathy.  Neurological: She is alert and oriented to person, place, and time. No cranial nerve deficit. She exhibits normal muscle tone. Coordination normal.  Skin: Skin is warm and dry. She is not diaphoretic.    ED Course  Procedures (including critical care time) Labs Review Labs Reviewed - No data to display  Imaging Review No results found.   EKG Interpretation None      MDM   Final diagnoses:  None    Patient presents with complaints of  headache, nausea vomiting, dizziness after falling and striking her head on a vacuum cleaner. She is neurologically intact and CT scan of the head is unremarkable. This appears to be a concussion which will be treated with rest and when necessary return. She can take Tylenol or ibuprofen as needed for headache.    Veryl Speak, MD 10/05/13 0700

## 2013-10-05 NOTE — ED Notes (Signed)
Pt. fell at home and hit the back of her head against the vacuum cleaner yesterday , no LOC / ambulatory , alert and oriented/respirations unlabored , reports dizziness / chest tightness and emesis x2 this evening .

## 2013-10-05 NOTE — ED Notes (Signed)
Pt ambulated to room without difficulty, unassisted

## 2013-10-05 NOTE — ED Notes (Signed)
Pt reports left shoulder pain 1/10, dizziness, N/V after falling backwards and hitting head and shoulder on a vacuum cleaner. States she had LOC for a few seconds post fall. Experiencing chest tightness, SOB and appears to be highly anxious at this time. Came to ER for further evaluation.

## 2013-10-05 NOTE — Discharge Instructions (Signed)
Tylenol 650 mg rotated with Motrin 400 mg every 4 hours as needed for pain.  Return to the emergency department if you develop severe headache, or other new and concerning symptoms.   Head Injury You have received a head injury. It does not appear serious at this time. Headaches and vomiting are common following head injury. It should be easy to awaken from sleeping. Sometimes it is necessary for you to stay in the emergency department for a while for observation. Sometimes admission to the hospital may be needed. After injuries such as yours, most problems occur within the first 24 hours, but side effects may occur up to 7-10 days after the injury. It is important for you to carefully monitor your condition and contact your health care provider or seek immediate medical care if there is a change in your condition. WHAT ARE THE TYPES OF HEAD INJURIES? Head injuries can be as minor as a bump. Some head injuries can be more severe. More severe head injuries include:  A jarring injury to the brain (concussion).  A bruise of the brain (contusion). This mean there is bleeding in the brain that can cause swelling.  A cracked skull (skull fracture).  Bleeding in the brain that collects, clots, and forms a bump (hematoma). WHAT CAUSES A HEAD INJURY? A serious head injury is most likely to happen to someone who is in a car wreck and is not wearing a seat belt. Other causes of major head injuries include bicycle or motorcycle accidents, sports injuries, and falls. HOW ARE HEAD INJURIES DIAGNOSED? A complete history of the event leading to the injury and your current symptoms will be helpful in diagnosing head injuries. Many times, pictures of the brain, such as CT or MRI are needed to see the extent of the injury. Often, an overnight hospital stay is necessary for observation.  WHEN SHOULD I SEEK IMMEDIATE MEDICAL CARE?  You should get help right away if:  You have confusion or drowsiness.  You feel  sick to your stomach (nauseous) or have continued, forceful vomiting.  You have dizziness or unsteadiness that is getting worse.  You have severe, continued headaches not relieved by medicine. Only take over-the-counter or prescription medicines for pain, fever, or discomfort as directed by your health care provider.  You do not have normal function of the arms or legs or are unable to walk.  You notice changes in the black spots in the center of the colored part of your eye (pupil).  You have a clear or bloody fluid coming from your nose or ears.  You have a loss of vision. During the next 24 hours after the injury, you must stay with someone who can watch you for the warning signs. This person should contact local emergency services (911 in the [place] if you have seizures, you become unconscious, or you are unable to wake up. HOW CAN I PREVENT A HEAD INJURY IN THE FUTURE? The most important factor for preventing major head injuries is avoiding motor vehicle accidents. To minimize the potential for damage to your head, it is crucial to wear seat belts while riding in motor vehicles. Wearing helmets while bike riding and playing collision sports (like football) is also helpful. Also, avoiding dangerous activities around the house will further help reduce your risk of head injury.  WHEN CAN I RETURN TO NORMAL ACTIVITIES AND ATHLETICS? You should be reevaluated by your health care provider before returning to these activities. If you have any of the  following symptoms, you should not return to activities or contact sports until 1 week after the symptoms have stopped:  Persistent headache.  Dizziness or vertigo.  Poor attention and concentration.  Confusion.  Memory problems.  Nausea or vomiting.  Fatigue or tire easily.  Irritability.  Intolerant of bright lights or loud noises.  Anxiety or depression.  Disturbed sleep. MAKE SURE YOU:   Understand these instructions.  Will  watch your condition.  Will get help right away if you are not doing well or get worse. Document Released: 01/02/2005 Document Revised: 01/07/2013 Document Reviewed: 09/09/2012 [facility] Patient Information 2015 [place], [place]. This information is not intended to replace advice given to you by your health care provider. Make sure you discuss any questions you have with your health care provider.

## 2013-10-06 ENCOUNTER — Other Ambulatory Visit: Payer: Self-pay | Admitting: Family

## 2013-10-06 ENCOUNTER — Ambulatory Visit
Admission: RE | Admit: 2013-10-06 | Discharge: 2013-10-06 | Disposition: A | Discharge status: Home / Self Care | Payer: BC Managed Care – PPO | Source: Ambulatory Visit | Attending: Family | Admitting: Family

## 2013-10-06 DIAGNOSIS — R0781 Pleurodynia: Secondary | ICD-10-CM

## 2013-10-07 ENCOUNTER — Emergency Department (HOSPITAL_COMMUNITY): Payer: BC Managed Care – PPO

## 2013-10-07 ENCOUNTER — Encounter (HOSPITAL_COMMUNITY): Payer: Self-pay | Admitting: Emergency Medicine

## 2013-10-07 ENCOUNTER — Emergency Department (HOSPITAL_COMMUNITY)
Admission: EM | Admit: 2013-10-07 | Discharge: 2013-10-07 | Disposition: A | Discharge status: Home / Self Care | Payer: BC Managed Care – PPO | Attending: Emergency Medicine | Admitting: Emergency Medicine

## 2013-10-07 DIAGNOSIS — Z8719 Personal history of other diseases of the digestive system: Secondary | ICD-10-CM | POA: Insufficient documentation

## 2013-10-07 DIAGNOSIS — F419 Anxiety disorder, unspecified: Secondary | ICD-10-CM

## 2013-10-07 DIAGNOSIS — I10 Essential (primary) hypertension: Secondary | ICD-10-CM | POA: Diagnosis not present

## 2013-10-07 DIAGNOSIS — R0602 Shortness of breath: Secondary | ICD-10-CM | POA: Diagnosis not present

## 2013-10-07 DIAGNOSIS — F411 Generalized anxiety disorder: Secondary | ICD-10-CM | POA: Diagnosis not present

## 2013-10-07 LAB — CBC WITH DIFFERENTIAL/PLATELET
BASOS ABS: 0 10*3/uL (ref 0.0–0.1)
Basophils Relative: 0 % (ref 0–1)
EOS PCT: 0 % (ref 0–5)
Eosinophils Absolute: 0.1 10*3/uL (ref 0.0–0.7)
HEMATOCRIT: 44.9 % (ref 36.0–46.0)
Hemoglobin: 17.1 g/dL — ABNORMAL HIGH (ref 12.0–15.0)
Lymphocytes Relative: 21 % (ref 12–46)
Lymphs Abs: 2.6 10*3/uL (ref 0.7–4.0)
MCH: 32.4 pg (ref 26.0–34.0)
MCHC: 38.1 g/dL — ABNORMAL HIGH (ref 30.0–36.0)
MCV: 85.2 fL (ref 78.0–100.0)
MONO ABS: 0.8 10*3/uL (ref 0.1–1.0)
Monocytes Relative: 7 % (ref 3–12)
Neutro Abs: 8.7 10*3/uL — ABNORMAL HIGH (ref 1.7–7.7)
Neutrophils Relative %: 71 % (ref 43–77)
PLATELETS: 350 10*3/uL (ref 150–400)
RBC: 5.27 MIL/uL — ABNORMAL HIGH (ref 3.87–5.11)
RDW: 11.5 % (ref 11.5–15.5)
WBC: 12.2 10*3/uL — AB (ref 4.0–10.5)

## 2013-10-07 LAB — I-STAT VENOUS BLOOD GAS, ED
Bicarbonate: 19.2 mEq/L — ABNORMAL LOW (ref 20.0–24.0)
O2 Saturation: 70 %
PCO2 VEN: 18.8 mmHg — AB (ref 45.0–50.0)
PO2 VEN: 28 mmHg — AB (ref 30.0–45.0)
TCO2: 20 mmol/L (ref 0–100)
pH, Ven: 7.619 (ref 7.250–7.300)

## 2013-10-07 LAB — BASIC METABOLIC PANEL
Anion gap: 21 — ABNORMAL HIGH (ref 5–15)
BUN: 13 mg/dL (ref 6–23)
CHLORIDE: 99 meq/L (ref 96–112)
CO2: 19 meq/L (ref 19–32)
Calcium: 10.9 mg/dL — ABNORMAL HIGH (ref 8.4–10.5)
Creatinine, Ser: 0.69 mg/dL (ref 0.50–1.10)
GFR calc non Af Amer: >90 mL/min (ref >90)
Glucose, Bld: 89 mg/dL (ref 70–99)
Potassium: 4 mEq/L (ref 3.7–5.3)
Sodium: 139 mEq/L (ref 137–147)

## 2013-10-07 LAB — D-DIMER, QUANTITATIVE: D-Dimer, Quant: <0.27 ug/mL-FEU (ref 0.00–0.48)

## 2013-10-07 MED ORDER — SODIUM CHLORIDE 0.9 % IV BOLUS (SEPSIS)
1000.0000 mL | Freq: Once | INTRAVENOUS | Status: AC
  Administered 2013-10-07: 1000 mL via INTRAVENOUS

## 2013-10-07 MED ORDER — IOHEXOL 350 MG/ML SOLN
100.0000 mL | Freq: Once | INTRAVENOUS | Status: AC | PRN
  Administered 2013-10-07: 100 mL via INTRAVENOUS

## 2013-10-07 MED ORDER — LORAZEPAM 2 MG/ML IJ SOLN
0.5000 mg | Freq: Once | INTRAMUSCULAR | Status: AC
  Administered 2013-10-07: 0.5 mg via INTRAVENOUS
  Filled 2013-10-07: qty 1

## 2013-10-07 MED ORDER — LORAZEPAM 2 MG/ML IJ SOLN
1.0000 mg | Freq: Once | INTRAMUSCULAR | Status: AC
  Administered 2013-10-07: 1 mg via INTRAVENOUS
  Filled 2013-10-07: qty 1

## 2013-10-07 MED ORDER — GI COCKTAIL ~~LOC~~
30.0000 mL | Freq: Once | ORAL | Status: AC
  Administered 2013-10-07: 30 mL via ORAL
  Filled 2013-10-07: qty 30

## 2013-10-07 MED ORDER — FAMOTIDINE 20 MG PO TABS
20.0000 mg | ORAL_TABLET | Freq: Once | ORAL | Status: AC
  Administered 2013-10-07: 20 mg via ORAL
  Filled 2013-10-07: qty 1

## 2013-10-07 MED ORDER — LORAZEPAM 2 MG/ML IJ SOLN: 1.0000 mg | Freq: Once | INTRAMUSCULAR | Status: DC

## 2013-10-07 NOTE — Progress Notes (Signed)
Critical results shown to MD.

## 2013-10-07 NOTE — Discharge Instructions (Signed)

## 2013-10-07 NOTE — ED Notes (Signed)
X 3 days ago - fell. Developed a concussion.  Chest pressure - difficult to breathe on inspiration.

## 2013-10-07 NOTE — Progress Notes (Signed)
ABG results are venous, RT showed to MD.

## 2013-10-07 NOTE — ED Notes (Signed)
Pt here for evaluation of respiratory distress. Pt states she fell when she tripped over the vacuum several days ago and reports she did lose consciousness. Pt reports sudden onset respiratory distress, accessory muscles being used, breathing symmetrical. Pt skin is pale and cool to touch. Pt reports tingling and numbness to her hands.

## 2013-10-07 NOTE — ED Notes (Signed)
CT nto done, MD wanted to hold off for now and observe pt on room air.

## 2013-10-07 NOTE — ED Notes (Signed)
sats in triage up and down ranging from 85 - 96% on [employer] Accessory muscle use. Took pt. Back to trauma C. Resident updated.

## 2013-10-07 NOTE — ED Notes (Signed)
Pt states the mask is making her breathing a little better.

## 2013-10-07 NOTE — ED Provider Notes (Signed)
CSN: [id number]     Arrival date & time 10/07/13  1558 History   First MD Initiated Contact with Patient 10/07/13 1612     Chief Complaint  Patient presents with  . Shortness of Breath     Patient is a 23 y.o. female presenting with shortness of breath. The history is provided by the patient.  Shortness of Breath Severity:  Severe Onset quality:  Gradual Duration:  3 days Timing:  Constant Progression:  Worsening Chronicity:  New Context: not URI   Context comment:  Fell 3 days ago and hit head with LOC briefly Associated symptoms: chest pain and cough   Associated symptoms: no abdominal pain, no diaphoresis, no fever, no headaches, no hemoptysis, no rash, no sputum production, no vomiting and no wheezing   Associated symptoms comment:  +pleurisy Risk factors: no family hx of DVT, no hx of cancer, no hx of PE/DVT, no oral contraceptive use, no prolonged immobilization, no recent surgery and no tobacco use     Past Medical History  Diagnosis Date  . GERD (gastroesophageal reflux disease)   . Hypertension   . Anxiety    Past Surgical History  Procedure Laterality Date  . Tympanostomy tube placement  1991, 1993, 2003  . Tonsillectomy and adenoidectomy  1992  . Laparoscopic cholecystectomy  2008  . Cesarean section  2010, 2012   Family History  Problem Relation Age of Onset  . Cancer Mother   . Heart disease Mother   . Diabetes Father   . Cancer Maternal Uncle   . Diabetes Maternal Grandmother   . Asthma Maternal Grandmother   . Diabetes Maternal Grandfather   . Diabetes Paternal Grandmother   . Diabetes Paternal Grandfather   . Breast cancer Maternal Grandmother    History  Substance Use Topics  . Smoking status: Never Smoker   . Smokeless tobacco: Not on file  . Alcohol Use: No   OB History   Grav Para Term Preterm Abortions TAB SAB Ect Mult Living   2 2  2            Review of Systems  Constitutional: Negative for fever, chills and diaphoresis.   Respiratory: Positive for cough and shortness of breath. Negative for hemoptysis, sputum production and wheezing.   Cardiovascular: Positive for chest pain. Negative for leg swelling.  Gastrointestinal: Negative for nausea, vomiting and abdominal pain.  Musculoskeletal: Negative for back pain.  Skin: Negative for rash.  Neurological: Negative for headaches.  Psychiatric/Behavioral: Negative for confusion.  All other systems reviewed and are negative.   Allergies  Albuterol; Hydrocodone; Codeine; Lortab; and Morphine and related  Home Medications   Prior to Admission medications   Medication Sig Start Date End Date Taking? Authorizing Provider  diphenhydrAMINE (BENADRYL) 25 MG tablet Take 25 mg by mouth every 6 (six) hours as needed.    Historical Provider, MD  ibuprofen (ADVIL,MOTRIN) 200 MG tablet Take 200-600 mg by mouth every 6 (six) hours as needed for fever, headache, mild pain, moderate pain or cramping.    Historical Provider, MD   BP 112/97  Pulse 62  Temp(Src) 99 F (37.2 C) (Axillary)  Resp 22  Ht 4\' 9"  (1.448 m)  Wt 103 lb (46.72 kg)  BMI 22.28 kg/m2  SpO2 100%  LMP 09/29/2013 Physical Exam  Nursing note and vitals reviewed. Constitutional: She is oriented to person, place, and time. She appears well-developed and well-nourished. No distress.  HENT:  Head: Normocephalic and atraumatic.  Nose: Nose normal.  Clear  oropharynx  Eyes: Conjunctivae are normal.  Neck: Normal range of motion. Neck supple. No JVD present. No tracheal deviation present.  Cardiovascular: Normal rate, regular rhythm and normal heart sounds.   No murmur heard. Pulmonary/Chest: No stridor. She is in respiratory distress. She has no wheezes. She has no rales. She exhibits tenderness.  No crepitus, Decreased at L upper base./ Exp phase not prolonged  Abdominal: Soft. Bowel sounds are normal. She exhibits no distension and no mass. There is no tenderness.  Musculoskeletal: Normal range of  motion. She exhibits no edema.  No lower extremity edema, calf tenderness, warmth, erythema or palpable cords  Neurological: She is alert and oriented to person, place, and time.  Skin: Skin is warm and dry. No rash noted.  Psychiatric: She has a normal mood and affect.    ED Course  Procedures (including critical care time) Labs Review Labs Reviewed  BASIC METABOLIC PANEL - Abnormal; Notable for the following:    Calcium 10.9 (*)    Anion gap 21 (*)    All other components within normal limits  I-STAT VENOUS BLOOD GAS, ED - Abnormal; Notable for the following:    pH, Ven 7.619 (*)    pCO2, Ven 18.8 (*)    pO2, Ven 28.0 (*)    Bicarbonate 19.2 (*)    All other components within normal limits  D-DIMER, QUANTITATIVE  CBC WITH DIFFERENTIAL    Imaging Review Dg Ribs Unilateral W/chest Right  10/06/2013   CLINICAL DATA:  RIGHT RIB PAIN  EXAM: RIGHT RIBS AND CHEST - 3+ VIEW  COMPARISON:  02/24/2013  FINDINGS: No fracture or other bone lesions are seen involving the ribs. There is no evidence of pneumothorax or pleural effusion. Both lungs are clear. Heart size and mediastinal contours are within normal limits.  IMPRESSION: Negative.   Electronically Signed   By: Rolm Baptise M.D.   On: 10/06/2013 13:26   Dg Chest Port 1 View  10/07/2013   CLINICAL DATA:  23 year old female with shortness of breath, respiratory distress. Initial encounter.  EXAM: PORTABLE CHEST - 1 VIEW  COMPARISON:  10/06/2013 and earlier.  FINDINGS: Portable AP upright view at 1630 hrs. Stable lung volumes. Normal cardiac size and mediastinal contours. Visualized tracheal air column is within normal limits. Allowing for portable technique, the lungs are clear. No pneumothorax or pleural effusion.  IMPRESSION: Negative, no acute cardiopulmonary abnormality.   Electronically Signed   By: Lars Pinks M.D.   On: 10/07/2013 16:40     EKG Interpretation   Date/Time:  Tuesday October 07 2013 16:35:21 EDT Ventricular Rate:   131 PR Interval:  102 QRS Duration: 86 QT Interval:  323 QTC Calculation: 477 R Axis:   82 Text Interpretation:  Sinus tachycardia Borderline repolarization  abnormality Borderline prolonged QT interval Confirmed by Ashok Cordia  MD,  Lennette Bihari ([id number]) on 10/07/2013 5:20:28 PM      MDM   Final diagnoses:  Shortness of breath  Anxiety    Presents in reps distress. Tachypnea, decreased breath sounds in LUL. Hypoxia and tachycardic. Placed on NRB. Bedside u/s without evidence of PTX.  Port CXR ordered STAT without evidence of PTX. On augmentin new medication for past one day but sx started prior to this, doubt allergic reaction.  No signs of DVT, but given distress and pleurisy with tachypnea and tachycardia, ordered PE study.  No CW tenderness and recent rib study negative, no rib fx on CXR.   VBG (ordered ABG) c/w hyperventilation. Coached on shallow  respirations gave ativan.   6:04 PM less tachypneic, on 3L [place], no hypoxia, pleurisy improving to resolution. D dimer negative. Hold CT PE study for now, will observe on RA.   7:27 PM no signs of resp distress.  No tachypnea.  Comfortable. Texting.  100% on RA. HR 105, pt says her heart rate is always 100.  Lungs CTAB. No chest pain.    At discharge HR 95. Comfortable. Requesting to go home. Lungs CTAB.  Discussed strict return precautions such as syncope, hemoptysis, worsening dyspnea/ chest pain.   Tammy Sours, MD 10/07/13 2329

## 2013-10-11 NOTE — ED Provider Notes (Signed)
I saw and evaluated the patient, reviewed the resident's note and I agree with the findings and plan.   EKG Interpretation   Date/Time:  Tuesday October 07 2013 16:35:21 EDT Ventricular Rate:  131 PR Interval:  102 QRS Duration: 86 QT Interval:  323 QTC Calculation: 477 R Axis:   82 Text Interpretation:  Sinus tachycardia Borderline repolarization  abnormality Borderline prolonged QT interval Confirmed by Ashok Cordia  MD,  Lennette Bihari ([id number]) on 10/07/2013 5:20:28 PM      Pt c/o sob. Hyperventilating and anxious on arrival.  Cxr. bs clear bil.    Mirna Mires, MD 10/11/13 [phone]

## 2013-11-12 ENCOUNTER — Other Ambulatory Visit: Payer: Self-pay | Admitting: Gastroenterology

## 2013-11-12 DIAGNOSIS — R110 Nausea: Secondary | ICD-10-CM

## 2013-11-12 DIAGNOSIS — K219 Gastro-esophageal reflux disease without esophagitis: Principal | ICD-10-CM

## 2013-11-12 DIAGNOSIS — IMO0001 Reserved for inherently not codable concepts without codable children: Secondary | ICD-10-CM

## 2013-11-17 ENCOUNTER — Encounter (HOSPITAL_COMMUNITY): Payer: Self-pay | Admitting: Emergency Medicine

## 2013-11-17 ENCOUNTER — Ambulatory Visit
Admission: RE | Admit: 2013-11-17 | Discharge: 2013-11-17 | Disposition: A | Discharge status: Home / Self Care | Payer: 59 | Source: Ambulatory Visit | Attending: Gastroenterology | Admitting: Gastroenterology

## 2013-11-17 DIAGNOSIS — K219 Gastro-esophageal reflux disease without esophagitis: Principal | ICD-10-CM

## 2013-11-17 DIAGNOSIS — R110 Nausea: Secondary | ICD-10-CM

## 2013-11-17 DIAGNOSIS — IMO0001 Reserved for inherently not codable concepts without codable children: Secondary | ICD-10-CM

## 2013-12-08 ENCOUNTER — Other Ambulatory Visit: Payer: Self-pay | Admitting: Gastroenterology

## 2013-12-08 DIAGNOSIS — R112 Nausea with vomiting, unspecified: Secondary | ICD-10-CM

## 2013-12-08 DIAGNOSIS — R1013 Epigastric pain: Secondary | ICD-10-CM

## 2013-12-16 ENCOUNTER — Ambulatory Visit
Admission: RE | Admit: 2013-12-16 | Discharge: 2013-12-16 | Disposition: A | Discharge status: Home / Self Care | Payer: 59 | Source: Ambulatory Visit | Attending: Gastroenterology | Admitting: Gastroenterology

## 2013-12-16 DIAGNOSIS — R112 Nausea with vomiting, unspecified: Secondary | ICD-10-CM

## 2013-12-16 DIAGNOSIS — R1013 Epigastric pain: Secondary | ICD-10-CM

## 2013-12-18 ENCOUNTER — Encounter: Payer: Self-pay | Admitting: Internal Medicine

## 2013-12-18 ENCOUNTER — Ambulatory Visit (INDEPENDENT_AMBULATORY_CARE_PROVIDER_SITE_OTHER)
Admission: RE | Admit: 2013-12-18 | Discharge: 2013-12-18 | Disposition: A | Discharge status: Home / Self Care | Payer: 59 | Source: Ambulatory Visit | Attending: Internal Medicine | Admitting: Internal Medicine

## 2013-12-18 ENCOUNTER — Ambulatory Visit (INDEPENDENT_AMBULATORY_CARE_PROVIDER_SITE_OTHER): Payer: 59 | Admitting: Internal Medicine

## 2013-12-18 VITALS — BP 110/80 | HR 132 | Ht <= 58 in | Wt 104.0 lb

## 2013-12-18 DIAGNOSIS — R911 Solitary pulmonary nodule: Secondary | ICD-10-CM

## 2013-12-18 DIAGNOSIS — R0600 Dyspnea, unspecified: Secondary | ICD-10-CM

## 2013-12-18 MED ORDER — IOHEXOL 350 MG/ML SOLN
75.0000 mL | Freq: Once | INTRAVENOUS | Status: AC | PRN
  Administered 2013-12-18: 75 mL via INTRAVENOUS

## 2013-12-18 NOTE — Patient Instructions (Addendum)
Prilosec 40 mg   Take 30-60 min before first meal of the day and Pepcid 20 mg one bedtime  Plus chloretrimeton 4 mg (chlorpheniramine)  two at bedtime on a trial basis for at least 2 week trial   Please see patient coordinator before you leave today  to schedule CTa chest today  GERD (REFLUX)  is an extremely common cause of respiratory symptoms just like yours , many times with no obvious heartburn at all.    It can be treated with medication, but also with lifestyle changes including avoidance of late meals, excessive alcohol, smoking cessation, and avoid fatty foods, chocolate, peppermint, colas, red wine, and acidic juices such as orange juice.  NO MINT OR MENTHOL PRODUCTS SO NO COUGH DROPS  USE SUGARLESS CANDY INSTEAD (Jolley ranchers or Stover's or Life Savers) or even ice chips will also do - the key is to swallow to prevent all throat clearing. NO OIL BASED VITAMINS - use powdered substitutes.    I will call you with the results

## 2013-12-18 NOTE — Progress Notes (Signed)
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 Endometriosis clinically but perfectly nl spirometry while activteily "wheezing" on first pulmonary eval 12/18/13 and nl CTa chest same day.   History of Present Illness  12/18/2013 1st [facility] office visit/ Amy Rivas   Chief Complaint  Patient presents with  . Pulmonary Consult    Referred by Dr. Marliss Rivas. Pt c/o pressure in chest and SOB x 5 months- symptoms only occue during her mentral cycle. She feels SOB awith or without any exertion and chest pressure occurs only with exertion.   sob sudden onset while during menses July 2015 but continues to happen twice between periods but only for sev hours but then 2-4 days continuous while on periods which are heavy just started bcp one day prior to OV  To control it.  When comes on between periods  Lasts 3-4 hours resolves on it's but can come on sitting still and worse with walking, then with  periods can last 3-4 days and at ov has been present for 3 staight days and first ov =  typical bad day.  Assoc with pain under R breast sharp fleeting if take deep breath.  Has gerd was on protonix not working so changed to [employer].  No obvious other patterns in day to day or daytime variabilty or assoc   cough or   chest tightness, subjective wheeze overt sinus   symptoms. No unusual exp hx or h/o childhood pna/ asthma or knowledge of premature birth.  Sleeping ok without nocturnal  or early am exacerbation  of respiratory  c/o's or need for noct saba. Also denies any obvious fluctuation of symptoms with weather or environmental changes or other aggravating or alleviating factors except as outlined above    Current Medications, Allergies, Complete Past Medical History, Past Surgical History, Family History, and Social History were reviewed in [employer] record.           Review of Systems  Constitutional: Negative for fever, chills and unexpected weight change.  HENT: Negative for congestion, dental problem, ear pain, nosebleeds, postnasal drip, rhinorrhea, sinus pressure, sneezing, sore throat, trouble swallowing and voice change.   Eyes: Negative for visual disturbance.  Respiratory: Positive for cough and shortness of breath. Negative for choking.   Cardiovascular: Positive for chest pain. Negative for leg swelling.  Gastrointestinal: Negative for vomiting, abdominal pain and diarrhea.  Genitourinary: Negative for difficulty urinating.       Heartburn  Musculoskeletal: Negative for arthralgias.  Skin: Negative for rash.  Neurological: Negative for tremors, syncope and headaches.  Hematological: Does not bruise/bleed easily.       Objective:   Physical Exam   amb anxious wf with classic psueudowheeze heard across the room   Wt Readings from Last 3 Encounters:  12/18/13 104 lb (47.174 kg)  10/07/13 103 lb (46.72 kg)  04/14/13 108 lb 8 oz (49.215 kg)    Vital signs reviewed  HEENT: nl dentition, turbinates, and orophanx. Nl external ear canals without cough reflex   NECK :  without JVD/Nodes/TM/ nl carotid upstrokes bilaterally   LUNGS: no acc muscle use, clear to A  and P bilaterally without cough on insp or exp maneuvers   CV:  RRR  no s3 or murmur or increase in P2, no edema   ABD:  soft and nontender with nl excursion in the supine position. No bruits or organomegaly, bowel sounds nl  MS:  warm without deformities, calf tenderness, cyanosis or clubbing  SKIN: warm and dry without lesions    NEURO:  alert, approp, no deficits    CTa 12/18/2013  Negative for pulmonary embolism. No acute abnormality. 4 mm right middle lobe nodule      Assessment & Plan:

## 2013-12-19 NOTE — Progress Notes (Signed)
Quick Note:  Spoke with pt and notified of results per Dr. Wert. Pt verbalized understanding and denied any questions.  ______ 

## 2013-12-19 NOTE — Progress Notes (Signed)
Quick Note:  LMTCB ______ 

## 2013-12-21 DIAGNOSIS — R911 Solitary pulmonary nodule: Secondary | ICD-10-CM | POA: Insufficient documentation

## 2013-12-21 NOTE — Assessment & Plan Note (Signed)
-   12/18/13 spirometry truncated early portion with nl effort indep portion  - 12/18/2013  Walked RA x 2 laps @ 185 ft each stopped due to  Sob and desats to 85% - CTa chest 12/18/2013 >  No PE    Symptoms are markedly disproportionate to objective findings and not clear this is a lung problem but pt does appear to have difficult airway management issues. DDX of  difficult airways management all start with A and  include Adherence, Ace Inhibitors, Acid Reflux, Active Sinus Disease, Alpha 1 Antitripsin deficiency, Anxiety masquerading as Airways dz,  ABPA,  allergy(esp in young), Aspiration (esp in elderly), Adverse effects of DPI,  Active smokers, plus two Bs  = Bronchiectasis and Beta blocker use..and one C= CHF  ? Acid (or non-acid) GERD > always difficult to exclude as up to 75% of pts in some series report no assoc GI/ Heartburn symptoms> rec max (24h)  acid suppression and diet restrictions/ reviewed and instructions given in writing.   ? Anxiety/vcd > doesn't usually affect ex sats but most likely contributing to some of her symptoms esp sob resolves s rx even in between periods  For now rx conservatively and control dysmenorrea as per gyn then regroup if symptoms persist

## 2013-12-21 NOTE — Assessment & Plan Note (Addendum)
See CTa 12/28/13 > 4 mm RM  Fleischner Society recommendations for incidental pulmonary nodules reviewed:  this is very very low risk so no need for f/u studies per guidelines

## 2013-12-23 ENCOUNTER — Institutional Professional Consult (permissible substitution): Payer: 59 | Admitting: Internal Medicine

## 2014-01-22 ENCOUNTER — Emergency Department (HOSPITAL_COMMUNITY)
Admission: EM | Admit: 2014-01-22 | Discharge: 2014-01-22 | Disposition: A | Discharge status: Home / Self Care | Payer: 59 | Attending: Emergency Medicine | Admitting: Emergency Medicine

## 2014-01-22 ENCOUNTER — Encounter (HOSPITAL_COMMUNITY): Payer: Self-pay | Admitting: Cardiology

## 2014-01-22 ENCOUNTER — Emergency Department (HOSPITAL_COMMUNITY): Payer: 59

## 2014-01-22 DIAGNOSIS — H53149 Visual discomfort, unspecified: Secondary | ICD-10-CM | POA: Diagnosis not present

## 2014-01-22 DIAGNOSIS — K219 Gastro-esophageal reflux disease without esophagitis: Secondary | ICD-10-CM | POA: Diagnosis not present

## 2014-01-22 DIAGNOSIS — R112 Nausea with vomiting, unspecified: Secondary | ICD-10-CM | POA: Diagnosis not present

## 2014-01-22 DIAGNOSIS — I10 Essential (primary) hypertension: Secondary | ICD-10-CM | POA: Insufficient documentation

## 2014-01-22 DIAGNOSIS — Z8659 Personal history of other mental and behavioral disorders: Secondary | ICD-10-CM | POA: Insufficient documentation

## 2014-01-22 DIAGNOSIS — Z79899 Other long term (current) drug therapy: Secondary | ICD-10-CM | POA: Insufficient documentation

## 2014-01-22 DIAGNOSIS — R51 Headache: Secondary | ICD-10-CM | POA: Diagnosis present

## 2014-01-22 DIAGNOSIS — R519 Headache, unspecified: Secondary | ICD-10-CM

## 2014-01-22 DIAGNOSIS — Z3202 Encounter for pregnancy test, result negative: Secondary | ICD-10-CM | POA: Diagnosis not present

## 2014-01-22 LAB — URINALYSIS, ROUTINE W REFLEX MICROSCOPIC
Bilirubin Urine: NEGATIVE
Glucose, UA: NEGATIVE mg/dL
HGB URINE DIPSTICK: NEGATIVE
Ketones, ur: 15 mg/dL — AB
Leukocytes, UA: NEGATIVE
Nitrite: NEGATIVE
PROTEIN: NEGATIVE mg/dL
SPECIFIC GRAVITY, URINE: 1.017 (ref 1.005–1.030)
UROBILINOGEN UA: 0.2 mg/dL (ref 0.0–1.0)
pH: 6 (ref 5.0–8.0)

## 2014-01-22 LAB — PREGNANCY, URINE: Preg Test, Ur: NEGATIVE

## 2014-01-22 MED ORDER — DIPHENHYDRAMINE HCL 50 MG/ML IJ SOLN
25.0000 mg | Freq: Once | INTRAMUSCULAR | Status: AC
  Administered 2014-01-22: 25 mg via INTRAVENOUS
  Filled 2014-01-22: qty 1

## 2014-01-22 MED ORDER — METOCLOPRAMIDE HCL 5 MG/ML IJ SOLN
10.0000 mg | Freq: Once | INTRAMUSCULAR | Status: AC
  Administered 2014-01-22: 10 mg via INTRAVENOUS
  Filled 2014-01-22: qty 2

## 2014-01-22 MED ORDER — SODIUM CHLORIDE 0.9 % IV BOLUS (SEPSIS)
1000.0000 mL | Freq: Once | INTRAVENOUS | Status: AC
  Administered 2014-01-22: 1000 mL via INTRAVENOUS

## 2014-01-22 NOTE — ED Notes (Signed)
Pt reports a headache and nausea for the past week. States that she was sent over here for further work up and possible CT scan. Pt also reports a low grade fever at home.

## 2014-01-22 NOTE — ED Notes (Signed)
Patient transported to CT 

## 2014-01-22 NOTE — ED Provider Notes (Signed)
Patient here with persistent headache ongoing for the past 5 days. Her PCP sent her here for head CT scan and further workup of a headache. CT scan of the head shows no acute pathology. Patient reported that she felt much better after receiving Reglan and Benadryl along with IV fluid. Her headache is mostly resolved. Reassurance given. Low suspicion for meningitis, subarachnoid hemorrhage, or stroke. Patient agrees to follow-up closely with her PCP for further care.  BP 127/86 mmHg  Pulse 114  Temp(Src) 98.6 F (37 C) (Oral)  Resp 14  Wt 104 lb (47.174 kg)  SpO2 96%  LMP 01/10/2014 tachycardia likely from anxiety. She does not appears to be dehydrated.  I have reviewed nursing notes and vital signs. I personally reviewed the imaging tests through PACS system  I reviewed available ER/hospitalization records thought the EMR  Results for orders placed or performed during the hospital encounter of 01/22/14  Urinalysis, Routine w reflex microscopic  Result Value Ref Range   Color, Urine YELLOW YELLOW   APPearance CLEAR CLEAR   Specific Gravity, Urine 1.017 1.005 - 1.030   pH 6.0 5.0 - 8.0   Glucose, UA NEGATIVE NEGATIVE mg/dL   Hgb urine dipstick NEGATIVE NEGATIVE   Bilirubin Urine NEGATIVE NEGATIVE   Ketones, ur 15 (A) NEGATIVE mg/dL   Protein, ur NEGATIVE NEGATIVE mg/dL   Urobilinogen, UA 0.2 0.0 - 1.0 mg/dL   Nitrite NEGATIVE NEGATIVE   Leukocytes, UA NEGATIVE NEGATIVE  Pregnancy, urine  Result Value Ref Range   Preg Test, Ur NEGATIVE NEGATIVE   Ct Head Wo Contrast  01/22/2014   CLINICAL DATA:  Diffuse headache, nausea for 1 week, low-grade fever  EXAM: CT HEAD WITHOUT CONTRAST  TECHNIQUE: Contiguous axial images were obtained from the base of the skull through the vertex without intravenous contrast.  COMPARISON:  10/05/2013  FINDINGS: There is no evidence of mass effect, midline shift or extra-axial fluid collections. There is no evidence of a space-occupying lesion or intracranial  hemorrhage. There is no evidence of a cortical-based area of acute infarction.  The ventricles and sulci are appropriate for the patient's age. The basal cisterns are patent.  Visualized portions of the orbits are unremarkable. The visualized portions of the paranasal sinuses and mastoid air cells are unremarkable.  The osseous structures are unremarkable.  IMPRESSION: Normal CT of the brain without intravenous contrast.   Electronically Signed   By: Kathreen Devoid   On: 01/22/2014 16:01      Domenic Moras, PA-C 01/22/14 [place], MD 01/23/14 0030

## 2014-01-22 NOTE — Discharge Instructions (Signed)

## 2014-01-22 NOTE — ED Provider Notes (Signed)
CSN: [id number]     Arrival date & time 01/22/14  1259 History   First MD Initiated Contact with Patient 01/22/14 1405     Chief Complaint  Patient presents with  . Headache     (Consider location/radiation/quality/duration/timing/severity/associated sxs/prior Treatment) HPI Comments: Patient is a 24 yo F PMHx significant for HTN, GERD, Anxiety presenting to the ED from her PCP office for evaluation of a right sided throbbing headache for the last six days with associated photophobia, phonophobia, nausea, non-bloody emesis x 1, low grade fever (<99.27F). Patient states her pain is persistent. She has tried tylenol without relief. She went to her PCP and was sent over to the ED for evaluation.   Patient is a 24 y.o. female presenting with headaches.  Headache Associated symptoms: nausea, photophobia and vomiting     Past Medical History  Diagnosis Date  . GERD (gastroesophageal reflux disease)   . Hypertension   . Anxiety    Past Surgical History  Procedure Laterality Date  . Tympanostomy tube placement  1991, 1993, 2003  . Tonsillectomy and adenoidectomy  1992  . Laparoscopic cholecystectomy  2008  . Cesarean section  2010, 2012   Family History  Problem Relation Age of Onset  . Lung cancer Maternal Grandmother   . Heart disease Mother   . Diabetes Father   . Cancer Maternal Uncle   . Diabetes Maternal Grandmother   . Asthma Maternal Grandmother   . Diabetes Maternal Grandfather   . Diabetes Paternal Grandmother   . Diabetes Paternal Grandfather   . Breast cancer Maternal Grandmother    History  Substance Use Topics  . Smoking status: Never Smoker   . Smokeless tobacco: Never Used  . Alcohol Use: No   OB History    Gravida Para Term Preterm AB TAB SAB Ectopic Multiple Living   2 2  2            Review of Systems  Eyes: Positive for photophobia and visual disturbance.  Gastrointestinal: Positive for nausea and vomiting.  Skin: Negative for rash.  Neurological:  Positive for headaches. Negative for syncope.  All other systems reviewed and are negative.     Allergies  Albuterol; Hydrocodone; Codeine; Lortab; and Morphine and related  Home Medications   Prior to Admission medications   Medication Sig Start Date End Date Taking? Authorizing Provider  ergocalciferol (VITAMIN D2) 50000 UNITS capsule Take 50,000 Units by mouth once a week.    Historical Provider, MD  Norethindrone (CAMILA PO) Take 1 tablet by mouth daily.    Historical Provider, MD  omeprazole (PRILOSEC) 40 MG capsule Take 40 mg by mouth daily.    Historical Provider, MD   BP 127/86 mmHg  Pulse 114  Temp(Src) 98.6 F (37 C) (Oral)  Resp 14  Wt 104 lb (47.174 kg)  SpO2 96%  LMP 01/10/2014 Physical Exam  Constitutional: She is oriented to person, place, and time. She appears well-developed and well-nourished. No distress.  HENT:  Head: Normocephalic and atraumatic.  Right Ear: External ear normal.  Left Ear: External ear normal.  Nose: Nose normal.  Mouth/Throat: Oropharynx is clear and moist. No oropharyngeal exudate.  Eyes: Conjunctivae, EOM and lids are normal. Pupils are equal, round, and reactive to light.  Neck: Normal range of motion. Neck supple.  Cardiovascular: Normal rate, regular rhythm, normal heart sounds and intact distal pulses.   Pulmonary/Chest: Effort normal and breath sounds normal. No respiratory distress.  Abdominal: Soft. There is no tenderness.  Musculoskeletal: Normal  range of motion.  Neurological: She is alert and oriented to person, place, and time. She has normal strength. No cranial nerve deficit. Gait normal. GCS eye subscore is 4. GCS verbal subscore is 5. GCS motor subscore is 6.  Sensation grossly intact.  No pronator drift.  Bilateral heel-knee-shin intact.  Skin: Skin is warm and dry. No rash noted. She is not diaphoretic.  Nursing note and vitals reviewed.   ED Course  Procedures (including critical care time) Medications  sodium  chloride 0.9 % bolus 1,000 mL (1,000 mLs Intravenous New Bag/Given 01/22/14 1532)  diphenhydrAMINE (BENADRYL) injection 25 mg (25 mg Intravenous Given 01/22/14 1532)  metoCLOPramide (REGLAN) injection 10 mg (10 mg Intravenous Given 01/22/14 1531)    Labs Review Labs Reviewed  URINALYSIS, ROUTINE W REFLEX MICROSCOPIC  PREGNANCY, URINE    Imaging Review No results found.   EKG Interpretation None      MDM   Final diagnoses:  Bad headache    Filed Vitals:   01/22/14 1522  BP: 127/86  Pulse: 114  Temp:   Resp: 14    Afebrile, NAD, non-toxic appearing, AAOx4.  No neurofocal deficits on examination.  Will obtain CT scan per PCP request.  Will treat with migraine cocktail and re-evaluate.  Will obtain UA/pregnancy test. Signed out to Domenic Moras, PA-C. Patient d/w with Dr. Aline Brochure, agrees with plan.     Harlow Mares, PA-C 01/22/14 [place], MD 01/24/14 [phone]

## 2014-01-24 ENCOUNTER — Emergency Department (HOSPITAL_COMMUNITY)
Admission: EM | Admit: 2014-01-24 | Discharge: 2014-01-24 | Disposition: A | Discharge status: Home / Self Care | Payer: 59 | Attending: Emergency Medicine | Admitting: Emergency Medicine

## 2014-01-24 ENCOUNTER — Emergency Department (HOSPITAL_COMMUNITY): Payer: 59

## 2014-01-24 ENCOUNTER — Encounter (HOSPITAL_COMMUNITY): Payer: Self-pay | Admitting: *Deleted

## 2014-01-24 DIAGNOSIS — R531 Weakness: Secondary | ICD-10-CM | POA: Insufficient documentation

## 2014-01-24 DIAGNOSIS — Z79899 Other long term (current) drug therapy: Secondary | ICD-10-CM | POA: Insufficient documentation

## 2014-01-24 DIAGNOSIS — R42 Dizziness and giddiness: Secondary | ICD-10-CM | POA: Diagnosis not present

## 2014-01-24 DIAGNOSIS — I10 Essential (primary) hypertension: Secondary | ICD-10-CM | POA: Insufficient documentation

## 2014-01-24 DIAGNOSIS — R7401 Elevation of levels of liver transaminase levels: Secondary | ICD-10-CM

## 2014-01-24 DIAGNOSIS — Z8659 Personal history of other mental and behavioral disorders: Secondary | ICD-10-CM | POA: Diagnosis not present

## 2014-01-24 DIAGNOSIS — R000 Tachycardia, unspecified: Secondary | ICD-10-CM | POA: Insufficient documentation

## 2014-01-24 DIAGNOSIS — R112 Nausea with vomiting, unspecified: Secondary | ICD-10-CM | POA: Insufficient documentation

## 2014-01-24 DIAGNOSIS — R5383 Other fatigue: Secondary | ICD-10-CM | POA: Diagnosis not present

## 2014-01-24 DIAGNOSIS — K219 Gastro-esophageal reflux disease without esophagitis: Secondary | ICD-10-CM | POA: Diagnosis not present

## 2014-01-24 DIAGNOSIS — R51 Headache: Secondary | ICD-10-CM | POA: Diagnosis present

## 2014-01-24 DIAGNOSIS — R740 Nonspecific elevation of levels of transaminase and lactic acid dehydrogenase [LDH]: Secondary | ICD-10-CM | POA: Diagnosis not present

## 2014-01-24 DIAGNOSIS — R519 Headache, unspecified: Secondary | ICD-10-CM

## 2014-01-24 LAB — COMPREHENSIVE METABOLIC PANEL
ALK PHOS: 72 U/L (ref 39–117)
ALT: 336 U/L — ABNORMAL HIGH (ref 0–35)
ANION GAP: 10 (ref 5–15)
AST: 93 U/L — ABNORMAL HIGH (ref 0–37)
Albumin: 4.7 g/dL (ref 3.5–5.2)
BILIRUBIN TOTAL: 1.1 mg/dL (ref 0.3–1.2)
BUN: 6 mg/dL (ref 6–23)
CALCIUM: 9.6 mg/dL (ref 8.4–10.5)
CHLORIDE: 103 meq/L (ref 96–112)
CO2: 22 mmol/L (ref 19–32)
CREATININE: 0.82 mg/dL (ref 0.50–1.10)
GFR calc non Af Amer: >90 mL/min (ref >90)
GLUCOSE: 83 mg/dL (ref 70–99)
POTASSIUM: 4 mmol/L (ref 3.5–5.1)
SODIUM: 135 mmol/L (ref 135–145)
TOTAL PROTEIN: 7.3 g/dL (ref 6.0–8.3)

## 2014-01-24 LAB — I-STAT CG4 LACTIC ACID, ED: Lactic Acid, Venous: 1.03 mmol/L (ref 0.5–2.2)

## 2014-01-24 LAB — CBC WITH DIFFERENTIAL/PLATELET
Basophils Absolute: 0 10*3/uL (ref 0.0–0.1)
Basophils Relative: 0 % (ref 0–1)
EOS ABS: 0.1 10*3/uL (ref 0.0–0.7)
Eosinophils Relative: 1 % (ref 0–5)
HEMATOCRIT: 45.8 % (ref 36.0–46.0)
HEMOGLOBIN: 16.7 g/dL — AB (ref 12.0–15.0)
Lymphocytes Relative: 20 % (ref 12–46)
Lymphs Abs: 1.2 10*3/uL (ref 0.7–4.0)
MCH: 32.1 pg (ref 26.0–34.0)
MCHC: 36.5 g/dL — ABNORMAL HIGH (ref 30.0–36.0)
MCV: 87.9 fL (ref 78.0–100.0)
Monocytes Absolute: 0.5 10*3/uL (ref 0.1–1.0)
Monocytes Relative: 7 % (ref 3–12)
Neutro Abs: 4.5 10*3/uL (ref 1.7–7.7)
Neutrophils Relative %: 72 % (ref 43–77)
Platelets: 228 10*3/uL (ref 150–400)
RBC: 5.21 MIL/uL — ABNORMAL HIGH (ref 3.87–5.11)
RDW: 11.6 % (ref 11.5–15.5)
WBC: 6.3 10*3/uL (ref 4.0–10.5)

## 2014-01-24 LAB — MONONUCLEOSIS SCREEN: MONO SCREEN: NEGATIVE

## 2014-01-24 LAB — ACETAMINOPHEN LEVEL: Acetaminophen (Tylenol), Serum: <10 ug/mL — ABNORMAL LOW (ref 10–30)

## 2014-01-24 LAB — LIPASE, BLOOD: Lipase: 22 U/L (ref 11–59)

## 2014-01-24 MED ORDER — DIPHENHYDRAMINE HCL 25 MG PO TABS: 25.0000 mg | ORAL_TABLET | Freq: Four times a day (QID) | ORAL | Status: DC

## 2014-01-24 MED ORDER — METOCLOPRAMIDE HCL 10 MG PO TABS: 10.0000 mg | ORAL_TABLET | Freq: Four times a day (QID) | ORAL | Status: DC | PRN

## 2014-01-24 MED ORDER — SODIUM CHLORIDE 0.9 % IV BOLUS (SEPSIS)
1000.0000 mL | Freq: Once | INTRAVENOUS | Status: AC
  Administered 2014-01-24: 1000 mL via INTRAVENOUS

## 2014-01-24 MED ORDER — GADOBENATE DIMEGLUMINE 529 MG/ML IV SOLN: 10.0000 mL | Freq: Once | INTRAVENOUS | Status: DC | PRN

## 2014-01-24 MED ORDER — METOCLOPRAMIDE HCL 5 MG/ML IJ SOLN: 10.0000 mg | Freq: Once | INTRAMUSCULAR | Status: DC

## 2014-01-24 MED ORDER — KETOROLAC TROMETHAMINE 60 MG/2ML IM SOLN: 60.0000 mg | Freq: Once | INTRAMUSCULAR | Status: DC

## 2014-01-24 MED ORDER — ONDANSETRON HCL 4 MG/2ML IJ SOLN
4.0000 mg | Freq: Once | INTRAMUSCULAR | Status: AC
  Administered 2014-01-24: 4 mg via INTRAVENOUS
  Filled 2014-01-24: qty 2

## 2014-01-24 MED ORDER — METOCLOPRAMIDE HCL 5 MG/ML IJ SOLN
10.0000 mg | Freq: Once | INTRAMUSCULAR | Status: AC
  Administered 2014-01-24: 10 mg via INTRAVENOUS
  Filled 2014-01-24: qty 2

## 2014-01-24 MED ORDER — DIPHENHYDRAMINE HCL 50 MG/ML IJ SOLN: 25.0000 mg | Freq: Once | INTRAMUSCULAR | Status: DC

## 2014-01-24 MED ORDER — DIPHENHYDRAMINE HCL 50 MG/ML IJ SOLN
25.0000 mg | Freq: Once | INTRAMUSCULAR | Status: AC
  Administered 2014-01-24: 25 mg via INTRAVENOUS
  Filled 2014-01-24: qty 1

## 2014-01-24 MED ORDER — PREDNISONE 10 MG PO TABS
ORAL_TABLET | ORAL | Status: DC
Start: 2014-01-24 — End: 2015-05-29

## 2014-01-24 NOTE — Consult Note (Signed)
Reason for Consult: Intractable headache Referring Physician: Dr Wyvonnia Dusky  CC: Headache  HPI: Amy Rivas is a 24 y.o. female with a history of severe gastroesophageal reflux disease as well as headaches and shortness of breath associated with her menstrual cycle. Last Saturday she was sitting on the couch watching television when she had the sudden onset of severe pain in the right parietal-occipital area of her head. She states that the pain was throbbing and was an 8 or 9 on a 1-10 scale. It was associated with nausea and later vomiting. She also had photophobia and occasional small flashes of light in her visual fields. This pain was significantly different from the headaches that she has had associated with her menstrual cycle and her menstrual cycle had been completed earlier in December. She did not seek medical attention initially; however, the headache persisted and this past Thursday, 01/22/2014, she presented to the emergency department at [facility] where she was evaluated. A head CT was unremarkable. She was treated with Benadryl and Reglan and the headache resolved. He was discharged from the emergency department but several hours later the headache returned. Today she was contacted at home by one of the emergency room physicians in follow-up. When she stated that she was still having a headache she was instructed to return to the emergency department for further evaluation. Neurology was asked to see the patient for further recommendations.  Past Medical History  Diagnosis Date  . GERD (gastroesophageal reflux disease)   . Hypertension associated with pregnancy.   . Anxiety     Past Surgical History  Procedure Laterality Date  . Tympanostomy tube placement  1991, 1993, 2003  . Tonsillectomy and adenoidectomy  1992  . Laparoscopic cholecystectomy  2008  . Cesarean section  2010, 2012    Family History  Problem Relation Age of Onset  . Lung cancer Maternal  Grandmother   . Heart disease Pituitary adenoma  Mother   . Diabetes Father   . Cancer Maternal Uncle   . Diabetes Maternal Grandmother   . Asthma Maternal Grandmother   . Diabetes Maternal Grandfather   . Diabetes Paternal Grandmother   . Diabetes Paternal Grandfather   . Breast cancer Maternal Grandmother     Social History:  reports that she has never smoked. She has never used smokeless tobacco. She reports that she does not drink alcohol or use illicit drugs. She has 2 children. She is currently working on her master's degree.  Allergies  Allergen Reactions  . Albuterol Anaphylaxis  . Hydrocodone Anaphylaxis and Nausea And Vomiting  . Codeine Nausea And Vomiting  . Lortab [Hydrocodone-Acetaminophen] Nausea And Vomiting    Patient can tolerate acetaminophen solely  . Morphine And Related Swelling and Rash    Medications:  No current facility-administered medications for this encounter.   Current Outpatient Prescriptions  Medication Sig Dispense Refill  . acetaminophen (TYLENOL) 500 MG tablet Take 1,000 mg by mouth every 6 (six) hours as needed for fever.    . ergocalciferol (VITAMIN D2) 50000 UNITS capsule Take 50,000 Units by mouth once a week. On Saturday    . omeprazole (PRILOSEC) 40 MG capsule Take 40 mg by mouth daily.     I have reviewed the patient's current medications.  ROS: History obtained from the patient and her mother  General ROS: negative for - chills, fatigue, fever, night sweats, weight gain or weight loss Psychological ROS: History of anxiety but patient denies any recent unusual stress. Ophthalmic ROS: negative  for - blurry vision, double vision, eye pain or loss of vision ENT ROS: negative for - epistaxis, nasal discharge, oral lesions, sore throat, tinnitus or vertigo Allergy and Immunology ROS: negative for - hives or itchy/watery eyes Hematological and Lymphatic ROS: negative for - bleeding problems, bruising or swollen lymph nodes Endocrine  ROS: negative for - galactorrhea, hair pattern changes, polydipsia/polyuria or temperature intolerance Respiratory ROS: negative for - Shortness of breath associated with her menstrual cycle. CTA of chest 12/18/2013. Cardiovascular ROS: negative for - chest pain, dyspnea on exertion, edema or irregular heartbeat Gastrointestinal ROS: Positive for severe GERD. Recent nausea and vomiting associated with her headache. Pt reports she has liver nodules. Genito-Urinary ROS: negative for - dysuria, hematuria, incontinence or urinary frequency/urgency Musculoskeletal ROS: negative for - joint swelling or muscular weakness Neurological ROS: as noted in HPI Dermatological ROS: Recently noted small nodules approximately 1 cm in diameter - arms, abdomen, back, and buttocks.   Physical Examination: Blood pressure 125/82, pulse 112, temperature 99.7 F (37.6 C), temperature source Rectal, resp. rate 20, last menstrual period 01/10/2014, SpO2 99 %.  Neurologic Examination  Mental Status: Alert, oriented, thought content appropriate.  Speech fluent without evidence of aphasia.  Able to follow 3 step commands without difficulty. Cranial Nerves: II: Discs not visualized; Visual fields grossly normal, pupils equal, round, reactive to light and accommodation. Mild light sensitivity noted. III,IV, VI: ptosis not present, extra-ocular motions intact bilaterally V,VII: smile symmetric, facial light touch sensation normal bilaterally VIII: hearing normal bilaterally IX,X: gag reflex present XI: bilateral shoulder shrug XII: midline tongue extension Motor: Right : Upper extremity   5/5    Left:     Upper extremity   5/5  Lower extremity   5/5     Lower extremity   5/5 Tone and bulk:normal tone throughout; no atrophy noted Sensory: Pinprick and light touch intact throughout, bilaterally Deep Tendon Reflexes: 2+ and symmetric throughout Plantars: Right: downgoing   Left: downgoing Cerebellar: normal  finger-to-nose, normal rapid alternating movements and normal heel-to-shin test Gait: Deferred  Laboratory Studies:   Basic Metabolic Panel:  Recent Labs Lab 01/24/14 1324  NA 135  K 4.0  CL 103  CO2 22  GLUCOSE 83  BUN 6  CREATININE 0.82  CALCIUM 9.6    Liver Function Tests:  Recent Labs Lab 01/24/14 1324  AST 93*  ALT 336*  ALKPHOS 72  BILITOT 1.1  PROT 7.3  ALBUMIN 4.7   No results for input(s): LIPASE, AMYLASE in the last 168 hours. No results for input(s): AMMONIA in the last 168 hours.  CBC:  Recent Labs Lab 01/24/14 1324  WBC 6.3  NEUTROABS 4.5  HGB 16.7*  HCT 45.8  MCV 87.9  PLT 228    Cardiac Enzymes: No results for input(s): CKTOTAL, CKMB, CKMBINDEX, TROPONINI in the last 168 hours.  BNP: Invalid input(s): POCBNP  CBG: No results for input(s): GLUCAP in the last 168 hours.  Microbiology: Results for orders placed or performed during the hospital encounter of 04/14/13  GC/Chlamydia Probe Amp (multiple spec sources)     Status: None   Collection Time: 04/14/13 10:56 AM  Result Value Ref Range Status   CT Probe RNA NEGATIVE NEGATIVE Final   GC Probe RNA NEGATIVE NEGATIVE Final    Comment: (NOTE)                                                                                       **  Normal Reference Range: Negative**      Assay performed using the Gen-Probe APTIMA COMBO2 (R) Assay. Acceptable specimen types for this assay include APTIMA Swabs (Unisex, endocervical, urethral, or vaginal), first void urine, and ThinPrep liquid based cytology samples. Performed at [facility], genital     Status: Abnormal   Collection Time: 04/14/13 10:56 AM  Result Value Ref Range Status   Yeast Wet Prep HPF POC NONE SEEN NONE SEEN Final   Trich, Wet Prep NONE SEEN NONE SEEN Final   Clue Cells Wet Prep HPF POC NONE SEEN NONE SEEN Final   WBC, Wet Prep HPF POC MODERATE (A) NONE SEEN Final    Coagulation Studies: No results for  input(s): LABPROT, INR in the last 72 hours.  Urinalysis:  Recent Labs Lab 01/22/14 1606  COLORURINE YELLOW  LABSPEC 1.017  PHURINE 6.0  GLUCOSEU NEGATIVE  HGBUR NEGATIVE  BILIRUBINUR NEGATIVE  KETONESUR 15*  PROTEINUR NEGATIVE  UROBILINOGEN 0.2  NITRITE NEGATIVE  LEUKOCYTESUR NEGATIVE    Lipid Panel:  No results found for: CHOL, TRIG, HDL, CHOLHDL, VLDL, LDLCALC  HgbA1C: No results found for: HGBA1C  Urine Drug Screen:  No results found for: LABOPIA, COCAINSCRNUR, LABBENZ, AMPHETMU, THCU, LABBARB  Alcohol Level: No results for input(s): ETH in the last 168 hours.  Other results: EKG: - 10/07/2013 - Sinus tachycardia rate 131 bpm. Please refer to the formal cardiology reading for complete details.  Imaging:     Ct Head Wo Contrast 01/22/2014    Normal CT of the brain without intravenous contrast.        CT ANGIOGRAPHY CHEST WITH CONTRAST 12/18/2013 IMPRESSION: Negative for pulmonary embolism. No acute abnormality.  4 mm right middle lobe nodule. If the patient is at high risk for bronchogenic carcinoma, follow-up chest CT at 1 year is recommended. If the patient is at low risk, no follow-up is needed. This recommendation follows the consensus statement: Guidelines for Management of Small Pulmonary Nodules Detected on CT Scans: A Statement from the [employer] as published in Radiology 2005; 237:395-400.    Assessment/Plan:  24 year old female with intractable headache pain with migrainous features and history of headaches associated with her menstrual cycle. The pain started approximately 1 week ago and has been relieved on 2 occasions with Benadryl and Reglan. A head CT was performed 01/22/2014 which was unremarkable. The patient was noted to be tachycardic at times with an elevated diastolic blood pressure of 105. At this time it is felt that the headaches are migraines; however, an MRI and MRV will be performed to rule out other pathology. Can  consider a medrol dose pack for symptomatic relief.  Mikey Bussing PA-C [facility] Pager [phone] 01/24/2014, 4:22 PM    Patient seen and evaluated. Agree with the above assessment and plan. In brief, she is a 23y/o woman presenting with 1 week of refractory headaches which appear migrainous in nature. Exam non-focal, she is afebrile with no nuchal rigidity. Will check MRI brain. If negative can be discharged home with outpatient follow up.  Jim Like, DO [facility] [phone]  If 7pm- 7am, please page neurology on call as listed in [place].

## 2014-01-24 NOTE — Discharge Instructions (Signed)
Migraine Headache A migraine headache is an intense, throbbing pain on one or both sides of your head. A migraine can last for 30 minutes to several hours. CAUSES  The exact cause of a migraine headache is not always known. However, a migraine may be caused when nerves in the brain become irritated and release chemicals that cause inflammation. This causes pain. Certain things may also trigger migraines, such as:  Alcohol.  Smoking.  Stress.  Menstruation.  Aged cheeses.  Foods or drinks that contain nitrates, glutamate, aspartame, or tyramine.  Lack of sleep.  Chocolate.  Caffeine.  Hunger.  Physical exertion.  Fatigue.  Medicines used to treat chest pain (nitroglycerine), birth control pills, estrogen, and some blood pressure medicines. SIGNS AND SYMPTOMS  Pain on one or both sides of your head.  Pulsating or throbbing pain.  Severe pain that prevents daily activities.  Pain that is aggravated by any physical activity.  Nausea, vomiting, or both.  Dizziness.  Pain with exposure to bright lights, loud noises, or activity.  General sensitivity to bright lights, loud noises, or smells. Before you get a migraine, you may get warning signs that a migraine is coming (aura). An aura may include:  Seeing flashing lights.  Seeing bright spots, halos, or zigzag lines.  Having tunnel vision or blurred vision.  Having feelings of numbness or tingling.  Having trouble talking.  Having muscle weakness. DIAGNOSIS  A migraine headache is often diagnosed based on:  Symptoms.  Physical exam.  A CT scan or MRI of your head. These imaging tests cannot diagnose migraines, but they can help rule out other causes of headaches. TREATMENT Medicines may be given for pain and nausea. Medicines can also be given to help prevent recurrent migraines.  HOME CARE INSTRUCTIONS  Only take over-the-counter or prescription medicines for pain or discomfort as directed by your  health care provider. The use of long-term narcotics is not recommended.  Lie down in a dark, quiet room when you have a migraine.  Keep a journal to find out what may trigger your migraine headaches. For example, write down:  What you eat and drink.  How much sleep you get.  Any change to your diet or medicines.  Limit alcohol consumption.  Quit smoking if you smoke.  Get 7-9 hours of sleep, or as recommended by your health care provider.  Limit stress.  Keep lights dim if bright lights bother you and make your migraines worse. SEEK IMMEDIATE MEDICAL CARE IF:   Your migraine becomes severe.  You have a fever.  You have a stiff neck.  You have vision loss.  You have muscular weakness or loss of muscle control.  You start losing your balance or have trouble walking.  You feel faint or pass out.  You have severe symptoms that are different from your first symptoms. MAKE SURE YOU:   Understand these instructions.  Will watch your condition.  Will get help right away if you are not doing well or get worse. Document Released: 01/02/2005 Document Revised: 05/19/2013 Document Reviewed: 09/09/2012 [facility] Patient Information 2015 [place], [place]. This information is not intended to replace advice given to you by your health care provider. Make sure you discuss any questions you have with your health care provider.  Liver Profile A liver profile is a battery of tests which helps your caregiver evaluate your liver function. The following tests are often included in the liver profile: Alanine aminotransferase (ALT or SGPT) This is an enzyme found primarily  in the liver. Abnormalities may represent liver disease. This is found in cells of the liver so when it is elevated, it has been released by damaged cells. Albumin - The serum albumin is one of the major proteins in the blood and a reflection of the general state of nutrition. This is low when the liver is unable to do its  job. It is also low when protein is lost in the urine. NORMAL FINDINGS Adult/Elderly  Total protein: 6.4-8.3 g/dL or 64-83 g/L (SI units)  Albumin: 3.5-5 g/dL or 35-50 g/L (SI units)  Globulin: 2.3-3.4 g/dL  Alpha1 globulin: 0.1-0.3 g/dL or 1-3 g/L (SI units)  Alpha2 globulin: 0.6-1 g/dL or 6-10 g/L (SI units)  Beta globulin: 0.7-1.1 g/dL or 7-11 g/L (SI units) Children  Total protein  Premature infant: 4.2-7.6 g/dL  Newborn: 4.6-7.4 g/dL  Infant: 6-6.7 g/dL  Child: 6.2-8 g/dL  Albumin  Premature infant: 3-4.2 g/dL  Newborn: 3.5-5.4 g/dL  Infant: 4.4-5.4 g/dL  Child: 4-5.9 g/dL Albumin/Globulin ratio - Calculated by dividing the albumin by the globulin. It is a measure of well being.  Alkaline phosphatase - This is an enzyme which is important in diagnosing proper bone and liver functions. NORMAL FINDINGS Age / Normal Value (units/L)  0-5 days / 35-140  Less than 3 yr / 15-60  3-6 yr / 15-50  6-12 yr / 10-50  12-18 yr / 10-40  Adult / 0-35 units/L or 0-0.58 microKat/L (SI Units) (Females tend to have slightly lower levels than males)  Elderly / Slightly higher than adults Aspartate aminotransferase (AST or SGOT) - an enzyme found in skeletal and heart muscle, liver and other organs. Abnormalities may represent liver disease. This is found in cells of the liver so when it is elevated, it has been released by damaged cells. Bilirubin, Total: A chemical involved with liver functions. High concentrations may result in jaundice. Jaundice is a yellowing of the skin and the whites of the eyes. NORMAL FINDINGS Blood  Adult/elderly/child  Total bilirubin: 0.3-1.0 mg/dL or 5.1-17 micromole/L (SI units)  Indirect bilirubin: 0.2-0.8 mg/dL or 3.4-12.0 micromole/L (SI units)  Direct bilirubin: 0.1-0.3 mg/dL or 1.7-5.1 micromole/L (SI units)  Newborn total bilirubin: 1.0-12.0 mg/dL or 17.1-205 micromole/L (SI units)  Urine0-0.02 mg/dL Ranges for normal findings  may vary among different laboratories and hospitals. You should always check with your doctor after having lab work or other tests done to discuss the meaning of your test results and whether your values are considered within normal limits PREPARATION FOR TEST No preparation or fasting is necessary unless you have been informed otherwise. A blood sample is obtained by inserting a needle into a vein in the arm. MEANING OF TEST  Your caregiver will go over the test results with you and discuss the importance and meaning of your results, as well as treatment options and the need for additional tests if necessary. OBTAINING THE TEST RESULTS It is your responsibility to obtain your test results. Ask the lab or department performing the test when and how you will get your results. Document Released: 02/05/2004 Document Revised: 03/27/2011 Document Reviewed: 05/06/2013 [facility] Patient Information 2015 [place], [place]. This information is not intended to replace advice given to you by your health care provider. Make sure you discuss any questions you have with your health care provider.

## 2014-01-24 NOTE — ED Notes (Signed)
Pt was seen here on 1/7 for severe headache and was dc home. Was called by dr Aline Brochure this am to check on pt and was told to come back here due to return of headache and n/v.

## 2014-01-24 NOTE — ED Notes (Signed)
Pt to MRI at this time.

## 2014-01-24 NOTE — ED Notes (Signed)
Pt. Refused wheelchair and left with all belongings 

## 2014-01-24 NOTE — ED Provider Notes (Signed)
10:44 AM I called the patient to check on her while I was signing charts this morning. She notes continued vomiting and return of HA. I felt like she should return to the ED for repeat evaluation and symptom control. She agrees. I will call and discuss the case w/ the Pod [facility] physician and notify them of her return.   Pamella Pert, MD 01/24/14 1045

## 2014-01-24 NOTE — ED Provider Notes (Signed)
CSN: [id number]     Arrival date & time 01/24/14  1221 History   First MD Initiated Contact with Patient 01/24/14 1228     Chief Complaint  Patient presents with  . Headache  . Emesis     (Consider location/radiation/quality/duration/timing/severity/associated sxs/prior Treatment) HPI My Amy Rivas is a 24 y.o. female with hz of GERD, htn, anxiety, presents to ED with complaint of a headache. Pt states headache started a week ago. It is on the right side, constant. Reports associated photophobia, nausea, vomiting. Was seen here two days ago, was treated with reglan and benadryl, states symptoms resolved, however when went home, states symptoms returned. She is unable to eat or drink anything. Taking tylenol but states she is not keeping it down. Denies neck pain or stiffness. Temp up to 99.5 at home. Denies visual changes. No difficulty ambulating. No injuries. Pt states she has had headaches associated with menstrual cycles but never this mad. Last period 15 days ago.   Past Medical History  Diagnosis Date  . GERD (gastroesophageal reflux disease)   . Hypertension   . Anxiety    Past Surgical History  Procedure Laterality Date  . Tympanostomy tube placement  1991, 1993, 2003  . Tonsillectomy and adenoidectomy  1992  . Laparoscopic cholecystectomy  2008  . Cesarean section  2010, 2012   Family History  Problem Relation Age of Onset  . Lung cancer Maternal Grandmother   . Heart disease Mother   . Diabetes Father   . Cancer Maternal Uncle   . Diabetes Maternal Grandmother   . Asthma Maternal Grandmother   . Diabetes Maternal Grandfather   . Diabetes Paternal Grandmother   . Diabetes Paternal Grandfather   . Breast cancer Maternal Grandmother    History  Substance Use Topics  . Smoking status: Never Smoker   . Smokeless tobacco: Never Used  . Alcohol Use: No   OB History    Gravida Para Term Preterm AB TAB SAB Ectopic Multiple Living   2 2  2            Review of  Systems  Constitutional: Positive for fatigue. Negative for fever and chills.  HENT: Negative for congestion, facial swelling and sore throat.   Eyes: Negative for visual disturbance.  Respiratory: Negative for cough, chest tightness and shortness of breath.   Cardiovascular: Negative for chest pain, palpitations and leg swelling.  Gastrointestinal: Negative for nausea, vomiting, abdominal pain and diarrhea.  Genitourinary: Negative for dysuria, flank pain and vaginal bleeding.  Musculoskeletal: Negative for myalgias, arthralgias, neck pain and neck stiffness.  Skin: Negative for rash.  Neurological: Positive for dizziness, weakness, light-headedness and headaches.  All other systems reviewed and are negative.     Allergies  Albuterol; Hydrocodone; Codeine; Lortab; and Morphine and related  Home Medications   Prior to Admission medications   Medication Sig Start Date End Date Taking? Authorizing Provider  acetaminophen (TYLENOL) 500 MG tablet Take 1,000 mg by mouth every 6 (six) hours as needed for fever.    Historical Provider, MD  ergocalciferol (VITAMIN D2) 50000 UNITS capsule Take 50,000 Units by mouth once a week. On Saturday    Historical Provider, MD  omeprazole (PRILOSEC) 40 MG capsule Take 40 mg by mouth daily.    Historical Provider, MD   BP 150/107 mmHg  Pulse 120  Resp 18  SpO2 99%  LMP 01/10/2014 Physical Exam  Constitutional: She is oriented to person, place, and time. She appears well-developed and well-nourished. No distress.  HENT:  Head: Normocephalic.  Right Ear: External ear normal.  Left Ear: External ear normal.  Nose: Nose normal.  Mouth/Throat: Oropharynx is clear and moist.  Eyes: Conjunctivae and EOM are normal. Pupils are equal, round, and reactive to light.  Neck: Normal range of motion. Neck supple.  No meningismus  Cardiovascular: Normal rate, regular rhythm and normal heart sounds.   Pulmonary/Chest: Effort normal and breath sounds normal. No  respiratory distress. She has no wheezes. She has no rales.  Abdominal: Soft. Bowel sounds are normal. She exhibits no distension. There is no tenderness. There is no rebound.  Musculoskeletal: She exhibits no edema.  Neurological: She is alert and oriented to person, place, and time. No cranial nerve deficit.  5/5 and equal upper strength bilaterally. Equal grip strength bilaterally. Right leg decreased strength with hip flexion compared to left. 5/5 and equal dorsiflexion and plantar flexion of the feet. Normal finger to nose and heel to shin. No pronator drift. Patellar reflexes 2+   Skin: Skin is warm and dry.  Psychiatric: She has a normal mood and affect. Her behavior is normal.  Nursing note and vitals reviewed.   ED Course  Procedures (including critical care time) Labs Review Labs Reviewed - No data to display  Imaging Review Ct Head Wo Contrast  01/22/2014   CLINICAL DATA:  Diffuse headache, nausea for 1 week, low-grade fever  EXAM: CT HEAD WITHOUT CONTRAST  TECHNIQUE: Contiguous axial images were obtained from the base of the skull through the vertex without intravenous contrast.  COMPARISON:  10/05/2013  FINDINGS: There is no evidence of mass effect, midline shift or extra-axial fluid collections. There is no evidence of a space-occupying lesion or intracranial hemorrhage. There is no evidence of a cortical-based area of acute infarction.  The ventricles and sulci are appropriate for the patient's age. The basal cisterns are patent.  Visualized portions of the orbits are unremarkable. The visualized portions of the paranasal sinuses and mastoid air cells are unremarkable.  The osseous structures are unremarkable.  IMPRESSION: Normal CT of the brain without intravenous contrast.   Electronically Signed   By: Kathreen Devoid   On: 01/22/2014 16:01     EKG Interpretation None      MDM   Final diagnoses:  Headache  Transaminitis  Non-intractable vomiting with nausea, vomiting of  unspecified type    1:19 PM Pt seen and examined. Here with headache for a week, seen for the same 2 days ago. Pt is tachycardic, actively vomiting. Headache is right sided. Some weakness noted on right leg on exam, but denies any neurodeficits that she has noted.   3:19 PM Patient is feeling much better after Reglan and IV fluids. We consulted neurology to come and see patient, there had been that side. Also noted the patient's LFTs are elevated. She had elevated LFTs in September when she was seen and admitted for abdominal pain. At that time hepatitis panel negative. Patient does have history of cholecystectomy. Will add lipase, Tylenol level, mono screen. Patient states she is still having some nausea will add Zofran.  4:10 PM Neurology has seen patient, recommended getting MRI and MRV of the brain. Discussed plan with patient and her family, they are agreeable to it.  Pt signed out to PA forcucci at shift change pending MR  Renold Genta, PA-C 01/25/14 4008  Ezequiel Essex, MD 01/25/14 [phone]

## 2014-01-24 NOTE — ED Provider Notes (Signed)
Patient is a 24 year old female who presents emergency room for evaluation of headache and emesis. Patient was signed out to me by previous provider as patient had a pending MRI and MRV of her brain. If both MRI and MRV were negative patient was to be discharged home with a prednisone taper and antinausea medications. Patient states that she is and completely pain free. She feels that all of her symptoms are gone. Physical Exam  BP 131/88 mmHg  Pulse 100  Temp(Src) 98.8 F (37.1 C) (Oral)  Resp 20  SpO2 100%  LMP 01/10/2014  Physical Exam  Constitutional: She is oriented to person, place, and time. She appears well-developed and well-nourished. No distress.  HENT:  Head: Normocephalic and atraumatic.  Mouth/Throat: Oropharynx is clear and moist. No oropharyngeal exudate.  Eyes: Conjunctivae and EOM are normal. Pupils are equal, round, and reactive to light. No scleral icterus.  Neck: Normal range of motion. Neck supple. No JVD present. No Brudzinski's sign and no Kernig's sign noted. No thyromegaly present.  Cardiovascular: Normal rate, regular rhythm, normal heart sounds and intact distal pulses.  Exam reveals no gallop and no friction rub.   No murmur heard. Pulmonary/Chest: Effort normal and breath sounds normal. No respiratory distress. She has no wheezes. She has no rales. She exhibits no tenderness.  Abdominal: Soft. Bowel sounds are normal. She exhibits no distension and no mass. There is no tenderness. There is no rebound and no guarding.  Musculoskeletal: Normal range of motion.  Lymphadenopathy:    She has no cervical adenopathy.  Neurological: She is alert and oriented to person, place, and time. She has normal strength. No cranial nerve deficit or sensory deficit.  Skin: Skin is warm and dry. She is not diaphoretic.  Psychiatric: She has a normal mood and affect. Her behavior is normal. Judgment and thought content normal.  Nursing note and vitals reviewed.   ED Course   Procedures  MDM  Patient is 24 year old female who presents emergency room for evaluation of headache and emesis. Physical exam reveals no focal neurological deficits. Patient is currently pain-free. Patient does have elevated LFTs with a negative Tylenol level.  Patient recently just had a normal ultrasound of her abdomen approximately one week ago. This may be transient elevation due to persistent nausea and vomiting. Have recommended that the patient have repeat LFTs performed in 1 week. MRI and MRV of the brain are negative. Per previous conversation with previous provider the neurologist recommended that she be placed on a prednisone taper and follow up with a neurologist in the office. I have recommended a headache diary in the meantime. I have also recommended that the patient taken antinausea medication as needed. Patient to return for sudden onset of the worst headache of her life, persistent nausea and vomiting despite antinausea medicine, weakness, or any other concerning symptoms. She states understanding and agreement at this time. Patient has been discussed with Dr. Wyvonnia Dusky and also Dr. Sabra Heck. They state understanding and agreement with the above workup and plan.      Cherylann Parr, PA-C 01/24/14 1914  Johnna Acosta, MD 01/24/14 2223

## 2014-01-28 ENCOUNTER — Other Ambulatory Visit (HOSPITAL_COMMUNITY): Payer: Self-pay | Admitting: Gastroenterology

## 2014-01-28 DIAGNOSIS — R112 Nausea with vomiting, unspecified: Secondary | ICD-10-CM

## 2014-02-05 ENCOUNTER — Encounter (HOSPITAL_COMMUNITY)
Admission: RE | Admit: 2014-02-05 | Discharge: 2014-02-05 | Disposition: A | Discharge status: Home / Self Care | Payer: 59 | Source: Ambulatory Visit | Attending: Gastroenterology | Admitting: Gastroenterology

## 2014-02-05 DIAGNOSIS — R112 Nausea with vomiting, unspecified: Secondary | ICD-10-CM | POA: Insufficient documentation

## 2014-02-05 MED ORDER — TECHNETIUM TC 99M SULFUR COLLOID
2.0000 | Freq: Once | INTRAVENOUS | Status: AC | PRN
  Administered 2014-02-05: 2 via ORAL

## 2014-03-05 ENCOUNTER — Ambulatory Visit (INDEPENDENT_AMBULATORY_CARE_PROVIDER_SITE_OTHER): Payer: 59 | Admitting: Neurology

## 2014-03-05 ENCOUNTER — Encounter: Payer: Self-pay | Admitting: Neurology

## 2014-03-05 VITALS — BP 102/68 | HR 70 | Temp 98.2°F | Resp 16 | Ht <= 58 in | Wt 100.2 lb

## 2014-03-05 DIAGNOSIS — G43411 Hemiplegic migraine, intractable, with status migrainosus: Secondary | ICD-10-CM

## 2014-03-05 DIAGNOSIS — G43829 Menstrual migraine, not intractable, without status migrainosus: Secondary | ICD-10-CM

## 2014-03-05 DIAGNOSIS — N943 Premenstrual tension syndrome: Secondary | ICD-10-CM

## 2014-03-05 NOTE — Patient Instructions (Signed)
You had a severe migraine episode.  Hopefully it won't happen again.  If it does, you can call the office and we can provide you either a prednisone taper again or we can have you come in for injections.  Hopefully this would prevent need to go to the ED.    As long as the typical migraines are well controlled, I would not make any changes.  Consider Excedrin Migraine instead of ibuprofen or tylenol. Because you had right sided weakness along with the migraine, I would not use sumatriptan or other class of triptans Follow up in 6 months

## 2014-03-05 NOTE — Progress Notes (Signed)
NEUROLOGY CONSULTATION NOTE  IMBERLY TROXLER MRN: [id number] DOB: December 15, 1990  Referring provider: Eloise Levels, MD Primary care provider: Eloise Levels, MD  Reason for consult:  Migraine  HISTORY OF PRESENT ILLNESS: Amy Rivas is a 24 year old right-handed woman with GERD, hypertension and anxiety who presents for headache.  Records, labs and MRI/MRV reviewed.  During the first week of January, she developed sudden onset of her typical migraine, although it was much more severe, associated with vomiting and intractable, lasting a week.  It was a 9/10 intensity.  It was not associated with her period, which was unusual.  t required two visits to the ED.  She didn't notice it, but objective testing by the ED physician and neurologist revealed right sided leg weakness.  MRI and MRV of the head was performed, which were unremarkable.  With the second visit, she received another round of migraine cocktail (Toradol, Benadryl, Reglan) as well as IV fluids.  She was discharged on a prednisone taper and the migraine finally broke.  She has not had a migraine since, but she was prescribed sumatriptan 50mg  in case it recurs.  She typically has menstrual migraines.  They started when she was 24 years old.  They are right sided and throbbing, about 5-6/10.  They are associated with nausea, photophobia, phonophobia and sometimes spots in her vision.  They are not typically associated with vomiting.  They occur on the first day of her period and last 2 to 3 days.  They are manageable and she takes Tylenol or ibuprofen.  PAST MEDICAL HISTORY: Past Medical History  Diagnosis Date  . GERD (gastroesophageal reflux disease)   . Anxiety   . Headache     PAST SURGICAL HISTORY: Past Surgical History  Procedure Laterality Date  . Tympanostomy tube placement  1991, 1993, 2003  . Tonsillectomy and adenoidectomy  1992  . Laparoscopic cholecystectomy  2008  . Cesarean section  2010, 2012     MEDICATIONS: Current Outpatient Prescriptions on File Prior to Visit  Medication Sig Dispense Refill  . acetaminophen (TYLENOL) 500 MG tablet Take 1,000 mg by mouth every 6 (six) hours as needed for fever.    Marland Kitchen omeprazole (PRILOSEC) 40 MG capsule Take 40 mg by mouth daily.    . diphenhydrAMINE (BENADRYL) 25 MG tablet Take 1 tablet (25 mg total) by mouth every 6 (six) hours. (Patient not taking: Reported on 03/05/2014) 20 tablet 0  . ergocalciferol (VITAMIN D2) 50000 UNITS capsule Take 50,000 Units by mouth once a week. On Saturday    . metoCLOPramide (REGLAN) 10 MG tablet Take 1 tablet (10 mg total) by mouth every 6 (six) hours as needed for nausea (nausea/headache). (Patient not taking: Reported on 03/05/2014) 6 tablet 0  . predniSONE (DELTASONE) 10 MG tablet Day 1 take 6 pills Day 2 take 5 pills Day 3 take 4 pills Day 4 take 3 pills Day 5 take 2 pills Day 6 take 1 pill (Patient not taking: Reported on 03/05/2014) 21 tablet 0   No current facility-administered medications on file prior to visit.    ALLERGIES: Allergies  Allergen Reactions  . Albuterol Anaphylaxis  . Hydrocodone Anaphylaxis and Nausea And Vomiting  . Codeine Nausea And Vomiting  . Lortab [Hydrocodone-Acetaminophen] Nausea And Vomiting    Patient can tolerate acetaminophen solely  . Morphine And Related Swelling and Rash    FAMILY HISTORY: Family History  Problem Relation Age of Onset  . Lung cancer Maternal Grandmother   . Heart disease  Mother   . Diabetes Father   . Cancer Maternal Uncle   . Diabetes Maternal Grandmother   . Asthma Maternal Grandmother   . Diabetes Maternal Grandfather   . Diabetes Paternal Grandmother   . Diabetes Paternal Grandfather   . Breast cancer Maternal Grandmother     SOCIAL HISTORY: History   Social History  . Marital Status: Married    Spouse Name: N/A  . Number of Children: N/A  . Years of Education: N/A   Occupational History  . Not on file.   Social History  Main Topics  . Smoking status: Never Smoker   . Smokeless tobacco: Never Used  . Alcohol Use: No  . Drug Use: No  . Sexual Activity:    Partners: Male    Birth Control/ Protection: Other-see comments   Other Topics Concern  . Not on file   Social History Narrative   ** Merged History Encounter **        REVIEW OF SYSTEMS: Constitutional: No fevers, chills, or sweats, no generalized fatigue, change in appetite Eyes: No visual changes, double vision, eye pain Ear, nose and throat: No hearing loss, ear pain, nasal congestion, sore throat Cardiovascular: No chest pain, palpitations Respiratory:  No shortness of breath at rest or with exertion, wheezes GastrointestinaI: No nausea, vomiting, diarrhea, abdominal pain, fecal incontinence Genitourinary:  No dysuria, urinary retention or frequency Musculoskeletal:  No neck pain, back pain Integumentary: No rash, pruritus, skin lesions Neurological: as above Psychiatric: No depression, insomnia, anxiety Endocrine: No palpitations, fatigue, diaphoresis, mood swings, change in appetite, change in weight, increased thirst Hematologic/Lymphatic:  No anemia, purpura, petechiae. Allergic/Immunologic: no itchy/runny eyes, nasal congestion, recent allergic reactions, rashes  PHYSICAL EXAM: Filed Vitals:   03/05/14 0806  BP: 102/68  Pulse: 70  Temp: 98.2 F (36.8 C)  Resp: 16   General: No acute distress Head:  Normocephalic/atraumatic Eyes:  fundi unremarkable, without vessel changes, exudates, hemorrhages or papilledema. Neck: supple, no paraspinal tenderness, full range of motion Back: No paraspinal tenderness Heart: regular rate and rhythm Lungs: Clear to auscultation bilaterally. Vascular: No carotid bruits. Neurological Exam: Mental status: alert and oriented to person, place, and time, recent and remote memory intact, fund of knowledge intact, attention and concentration intact, speech fluent and not dysarthric, language  intact. Cranial nerves: CN I: not tested CN II: pupils equal, round and reactive to light, visual fields intact, fundi unremarkable, without vessel changes, exudates, hemorrhages or papilledema. CN III, IV, VI:  full range of motion, no nystagmus, no ptosis CN V: facial sensation intact CN VII: upper and lower face symmetric CN VIII: hearing intact CN IX, X: gag intact, uvula midline CN XI: sternocleidomastoid and trapezius muscles intact CN XII: tongue midline Bulk & Tone: normal, no fasciculations. Motor:  5/5 throughout Sensation:  Pinprick and vibration intact Deep Tendon Reflexes:  2+ throughout, toes downgoing Finger to nose testing:  No dysmetria Heel to shin:  No dysmetria Gait:  Normal station and stride.  Able to turn and walk in tandem. Romberg negative.  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  At this point, we will not change management.  She may treat future migraines with tylenol or ibuprofen.  She may consider Excedrin migraine as well.  If the menstrual migraines become unmanageable, we can try a min-prophylaxis such as with naproxen. 2.  Because she exhibited hemiplegic symptoms with the migraine, I do not recommend taking sumatriptan or any other triptan due to the vasoconstrictive  properties of these medications. 3.  If she has another status migrainosus, she should first call the office and we can provide her a script for prednisone taper or she can come in for a migraine cocktail. 4.  Follow up in 6 months  Thank you for allowing me to take part in the care of this patient.  Metta Clines, DO  CC:  Eloise Levels, MD

## 2014-03-13 ENCOUNTER — Telehealth: Payer: Self-pay | Admitting: Neurology

## 2014-03-13 NOTE — Telephone Encounter (Signed)
Note faxed to Dr Eloise Levels at [phone] with confirmation received.

## 2014-09-03 ENCOUNTER — Ambulatory Visit: Payer: 59 | Admitting: Neurology

## 2015-01-25 ENCOUNTER — Emergency Department (HOSPITAL_COMMUNITY)
Admission: EM | Admit: 2015-01-25 | Discharge: 2015-01-26 | Disposition: A | Discharge status: Home / Self Care | Payer: Medicaid Other | Attending: Emergency Medicine | Admitting: Emergency Medicine

## 2015-01-25 ENCOUNTER — Encounter (HOSPITAL_COMMUNITY): Payer: Self-pay | Admitting: Family Medicine

## 2015-01-25 DIAGNOSIS — R1110 Vomiting, unspecified: Secondary | ICD-10-CM | POA: Diagnosis present

## 2015-01-25 DIAGNOSIS — K219 Gastro-esophageal reflux disease without esophagitis: Secondary | ICD-10-CM | POA: Diagnosis not present

## 2015-01-25 DIAGNOSIS — Z79899 Other long term (current) drug therapy: Secondary | ICD-10-CM | POA: Diagnosis not present

## 2015-01-25 DIAGNOSIS — Z8659 Personal history of other mental and behavioral disorders: Secondary | ICD-10-CM | POA: Insufficient documentation

## 2015-01-25 DIAGNOSIS — E860 Dehydration: Secondary | ICD-10-CM

## 2015-01-25 DIAGNOSIS — R112 Nausea with vomiting, unspecified: Secondary | ICD-10-CM

## 2015-01-25 DIAGNOSIS — R000 Tachycardia, unspecified: Secondary | ICD-10-CM | POA: Diagnosis not present

## 2015-01-25 DIAGNOSIS — Z8679 Personal history of other diseases of the circulatory system: Secondary | ICD-10-CM | POA: Diagnosis not present

## 2015-01-25 DIAGNOSIS — K529 Noninfective gastroenteritis and colitis, unspecified: Secondary | ICD-10-CM

## 2015-01-25 DIAGNOSIS — Z3202 Encounter for pregnancy test, result negative: Secondary | ICD-10-CM | POA: Diagnosis not present

## 2015-01-25 DIAGNOSIS — R197 Diarrhea, unspecified: Secondary | ICD-10-CM

## 2015-01-25 HISTORY — DX: Supraventricular tachycardia, unspecified: I47.10

## 2015-01-25 HISTORY — DX: Supraventricular tachycardia: I47.1

## 2015-01-25 LAB — CBC
HEMATOCRIT: 46.2 % — AB (ref 36.0–46.0)
HEMOGLOBIN: 16.5 g/dL — AB (ref 12.0–15.0)
MCH: 31.9 pg (ref 26.0–34.0)
MCHC: 35.7 g/dL (ref 30.0–36.0)
MCV: 89.2 fL (ref 78.0–100.0)
Platelets: 214 10*3/uL (ref 150–400)
RBC: 5.18 MIL/uL — ABNORMAL HIGH (ref 3.87–5.11)
RDW: 11.8 % (ref 11.5–15.5)
WBC: 8.6 10*3/uL (ref 4.0–10.5)

## 2015-01-25 LAB — COMPREHENSIVE METABOLIC PANEL
ALBUMIN: 4.3 g/dL (ref 3.5–5.0)
ALK PHOS: 42 U/L (ref 38–126)
ALT: 11 U/L — ABNORMAL LOW (ref 14–54)
ANION GAP: 14 (ref 5–15)
AST: 19 U/L (ref 15–41)
BUN: 12 mg/dL (ref 6–20)
CALCIUM: 9.3 mg/dL (ref 8.9–10.3)
CO2: 19 mmol/L — AB (ref 22–32)
Chloride: 103 mmol/L (ref 101–111)
Creatinine, Ser: 0.91 mg/dL (ref 0.44–1.00)
GFR calc Af Amer: >60 mL/min (ref >60)
GFR calc non Af Amer: >60 mL/min (ref >60)
GLUCOSE: 97 mg/dL (ref 65–99)
Potassium: 3.9 mmol/L (ref 3.5–5.1)
SODIUM: 136 mmol/L (ref 135–145)
Total Bilirubin: 1.5 mg/dL — ABNORMAL HIGH (ref 0.3–1.2)
Total Protein: 7.4 g/dL (ref 6.5–8.1)

## 2015-01-25 LAB — C DIFFICILE QUICK SCREEN W PCR REFLEX
C DIFFICILE (CDIFF) INTERP: NEGATIVE
C DIFFICILE (CDIFF) TOXIN: NEGATIVE
C Diff antigen: NEGATIVE

## 2015-01-25 LAB — I-STAT CG4 LACTIC ACID, ED: Lactic Acid, Venous: 0.9 mmol/L (ref 0.5–2.0)

## 2015-01-25 LAB — I-STAT BETA HCG BLOOD, ED (MC, WL, AP ONLY)

## 2015-01-25 LAB — LIPASE, BLOOD: Lipase: 19 U/L (ref 11–51)

## 2015-01-25 MED ORDER — LORAZEPAM 2 MG/ML IJ SOLN
0.5000 mg | Freq: Once | INTRAMUSCULAR | Status: AC
  Administered 2015-01-25: 0.5 mg via INTRAVENOUS
  Filled 2015-01-25: qty 1

## 2015-01-25 MED ORDER — ONDANSETRON HCL 4 MG/2ML IJ SOLN
INTRAMUSCULAR | Status: DC
Start: 2015-01-25 — End: 2015-01-26
  Filled 2015-01-25: qty 2

## 2015-01-25 MED ORDER — ONDANSETRON HCL 4 MG/2ML IJ SOLN
4.0000 mg | Freq: Once | INTRAMUSCULAR | Status: AC
  Administered 2015-01-25: 4 mg via INTRAVENOUS
  Filled 2015-01-25: qty 2

## 2015-01-25 MED ORDER — PROCHLORPERAZINE EDISYLATE 5 MG/ML IJ SOLN
10.0000 mg | Freq: Four times a day (QID) | INTRAMUSCULAR | Status: DC | PRN
  Administered 2015-01-25: 10 mg via INTRAVENOUS
  Filled 2015-01-25: qty 2

## 2015-01-25 MED ORDER — SODIUM CHLORIDE 0.9 % IV BOLUS (SEPSIS)
1000.0000 mL | Freq: Once | INTRAVENOUS | Status: AC
  Administered 2015-01-25: 1000 mL via INTRAVENOUS

## 2015-01-25 MED ORDER — ONDANSETRON HCL 4 MG/2ML IJ SOLN
4.0000 mg | Freq: Once | INTRAMUSCULAR | Status: AC
  Administered 2015-01-25: 4 mg via INTRAVENOUS

## 2015-01-25 NOTE — ED Notes (Signed)
Ginger ale given to the pt

## 2015-01-25 NOTE — ED Provider Notes (Signed)
CSN: [id number]     Arrival date & time 01/25/15  1810 History   First MD Initiated Contact with Patient 01/25/15 1851     Chief Complaint  Patient presents with  . Abdominal Pain  . Emesis  . Diarrhea     (Consider location/radiation/quality/duration/timing/severity/associated sxs/prior Treatment) Patient is a 25 y.o. female presenting with vomiting. The history is provided by the patient.  Emesis Severity:  Severe Duration:  1 day Timing:  Intermittent Quality:  Stomach contents Progression:  Unchanged Chronicity:  New Recent urination:  Decreased Context comment:  Children with "24 hour bug" Relieved by:  Nothing Worsened by:  Nothing tried Ineffective treatments:  None tried Associated symptoms: abdominal pain and diarrhea   Associated symptoms: no arthralgias, no chills, no fever, no headaches, no myalgias and no sore throat   Risk factors: sick contacts   Risk factors: no diabetes, not pregnant now, no prior abdominal surgery, no suspect food intake and no travel to endemic areas     Past Medical History  Diagnosis Date  . GERD (gastroesophageal reflux disease)   . Anxiety   . Headache   . PSVT (paroxysmal supraventricular tachycardia) [facility])    Past Surgical History  Procedure Laterality Date  . Tympanostomy tube placement  1991, 1993, 2003  . Tonsillectomy and adenoidectomy  1992  . Laparoscopic cholecystectomy  2008  . Cesarean section  2010, 2012   Family History  Problem Relation Age of Onset  . Lung cancer Maternal Grandmother   . Heart disease Mother   . Diabetes Father   . Cancer Maternal Uncle   . Diabetes Maternal Grandmother   . Asthma Maternal Grandmother   . Diabetes Maternal Grandfather   . Diabetes Paternal Grandmother   . Diabetes Paternal Grandfather   . Breast cancer Maternal Grandmother    Social History  Substance Use Topics  . Smoking status: Never Smoker   . Smokeless tobacco: Never Used  . Alcohol Use: No   OB History    Gravida Para Term Preterm AB TAB SAB Ectopic Multiple Living   2 2  2            Review of Systems  Constitutional: Negative for chills.  HENT: Negative for congestion and sore throat.   Eyes: Negative for pain.  Respiratory: Negative for chest tightness and shortness of breath.   Cardiovascular: Negative for chest pain.  Gastrointestinal: Positive for vomiting, abdominal pain and diarrhea.  Genitourinary: Negative for dysuria.  Musculoskeletal: Negative for myalgias and arthralgias.  Skin: Negative for rash.  Neurological: Negative for headaches.      Allergies  Albuterol; Hydrocodone; Codeine; Lortab; and Morphine and related  Home Medications   Prior to Admission medications   Medication Sig Start Date End Date Taking? Authorizing Provider  acetaminophen (TYLENOL) 500 MG tablet Take 1,000 mg by mouth every 6 (six) hours as needed for fever.    Historical Provider, MD  diphenhydrAMINE (BENADRYL) 25 MG tablet Take 1 tablet (25 mg total) by mouth every 6 (six) hours. Patient not taking: Reported on 03/05/2014 01/24/14   Starlyn Skeans, PA-C  ergocalciferol (VITAMIN D2) 50000 UNITS capsule Take 50,000 Units by mouth once a week. On Saturday    Historical Provider, MD  metoCLOPramide (REGLAN) 10 MG tablet Take 1 tablet (10 mg total) by mouth every 6 (six) hours as needed for nausea (nausea/headache). Patient not taking: Reported on 03/05/2014 01/24/14   Starlyn Skeans, PA-C  omeprazole (PRILOSEC) 40 MG capsule Take 40 mg by mouth daily.  Historical Provider, MD  ondansetron (ZOFRAN ODT) 4 MG disintegrating tablet Take 1 tablet (4 mg total) by mouth every 8 (eight) hours as needed for nausea or vomiting. 01/26/15   Esaw Grandchild, MD  predniSONE (DELTASONE) 10 MG tablet Day 1 take 6 pills Day 2 take 5 pills Day 3 take 4 pills Day 4 take 3 pills Day 5 take 2 pills Day 6 take 1 pill Patient not taking: Reported on 03/05/2014 01/24/14   Loma Sousa Forcucci, PA-C   BP 120/87 mmHg   Pulse 118  Temp(Src) 99.6 F (37.6 C) (Oral)  Resp 20  SpO2 100%  LMP 01/10/2015 Physical Exam  Constitutional: She appears well-developed and well-nourished. She appears distressed.  HENT:  Head: Normocephalic and atraumatic.  Eyes: Conjunctivae and EOM are normal. Pupils are equal, round, and reactive to light.  Neck: Normal range of motion.  Cardiovascular: Regular rhythm.  Tachycardia present.  Exam reveals no gallop and no friction rub.   No murmur heard. Pulmonary/Chest: Effort normal and breath sounds normal.  Abdominal: Normal appearance. She exhibits no distension and no ascites. There is no rigidity, no rebound, no guarding and no CVA tenderness.  Neurological: She is alert. No cranial nerve deficit or sensory deficit. GCS eye subscore is 4. GCS verbal subscore is 5. GCS motor subscore is 6.  Skin: No rash noted.  Psychiatric: Her speech is normal.    ED Course  Procedures (including critical care time) Labs Review Labs Reviewed  COMPREHENSIVE METABOLIC PANEL - Abnormal; Notable for the following:    CO2 19 (*)    ALT 11 (*)    Total Bilirubin 1.5 (*)    All other components within normal limits  CBC - Abnormal; Notable for the following:    RBC 5.18 (*)    Hemoglobin 16.5 (*)    HCT 46.2 (*)    All other components within normal limits  C DIFFICILE QUICK SCREEN W PCR REFLEX  GASTROINTESTINAL PANEL BY PCR, STOOL (REPLACES STOOL CULTURE)  LIPASE, BLOOD  URINALYSIS, ROUTINE W REFLEX MICROSCOPIC (NOT AT [facility])  I-STAT BETA HCG BLOOD, ED ([facility], [facility], [facility] ONLY)  I-STAT CG4 LACTIC ACID, ED  I-STAT CG4 LACTIC ACID, ED    Imaging Review No results found. I have personally reviewed and evaluated these images and lab results as part of my medical decision-making.   EKG Interpretation None      MDM   Final diagnoses:  Tachycardia  Dehydration  Gastroenteritis  Nausea, vomiting and diarrhea    25 year old Caucasian female presents in the setting of nausea,  vomiting, diarrhea, tachycardia. Patient noted initially to have heart rates in the 150s to 160s. EKG revealed sinus tachycardia. Patient reports that her children have had nausea vomiting diarrhea for personally 24 hours. She reports her symptoms started 10-12 hours ago. She reported due to continued on to vomit she presented to emergency department. Patient reports is nonbloody nonbilious emesis. She denies fevers, rashes, chest pain, shortness of breath, vaginal bleeding, vaginal discharge, dysuria. Patient did report an box 3 weeks ago for a throat infection.  Patient given IV fluids and anti-emetics and laboratory analysis was obtained. Patient found to not have a lactic acidosis or elevation of lipase. Do not believe pancreatitis is likely. Patient without significant electrolyte abnormality's. No significant elevation in white blood cell count. Patient is not pregnant. Patient denies dysuria and do not believe urinary tract infection is likely. Due to diarrhea and metabolic use C. difficile and stool sample sent. Results were not  available at time of disposition. Patient had significant improvement with IV fluids and anti-medics. This is likely viral gastroenteritis in setting of sick contacts with similar symptoms. We will discharge home with Zofran and plan to follow up PCP for further management. Patient given strict return precautions and stable at time of discharge.  Attending has seen and evaluated patient and Dr. Billy Fischer is in agreement with plan.    Esaw Grandchild, MD 01/26/15 [id number]  Gareth Morgan, MD 01/27/15 [phone]

## 2015-01-25 NOTE — ED Notes (Signed)
Pt here for for N,V,D that started last night. sts her kids have been sick.

## 2015-01-25 NOTE — ED Notes (Signed)
She continues to vomit.

## 2015-01-25 NOTE — ED Notes (Signed)
The p[t continues to vomit 

## 2015-01-25 NOTE — ED Notes (Signed)
The pt continues to vomit  Ativan given iv for the same

## 2015-01-25 NOTE — ED Notes (Signed)
Pt still vomiting and is now getting some soreness in her abd  From vomiting

## 2015-01-25 NOTE — ED Notes (Signed)
Heart rate 140 until she vomited a small amount.  Now her heart rate is 107.  She is retching at present

## 2015-01-26 LAB — GASTROINTESTINAL PANEL BY PCR, STOOL (REPLACES STOOL CULTURE)
ADENOVIRUS F40/41: NOT DETECTED
Astrovirus: NOT DETECTED
CRYPTOSPORIDIUM: NOT DETECTED
CYCLOSPORA CAYETANENSIS: NOT DETECTED
Campylobacter species: NOT DETECTED
E. COLI O157: NOT DETECTED
Entamoeba histolytica: NOT DETECTED
Enteroaggregative E coli (EAEC): NOT DETECTED
Enteropathogenic E coli (EPEC): NOT DETECTED
Enterotoxigenic E coli (ETEC): NOT DETECTED
Giardia lamblia: NOT DETECTED
Norovirus GI/GII: NOT DETECTED
Plesimonas shigelloides: NOT DETECTED
ROTAVIRUS A: NOT DETECTED
SALMONELLA SPECIES: NOT DETECTED
SAPOVIRUS (I, II, IV, AND V): DETECTED — AB
SHIGA LIKE TOXIN PRODUCING E COLI (STEC): NOT DETECTED
SHIGELLA/ENTEROINVASIVE E COLI (EIEC): NOT DETECTED
VIBRIO SPECIES: NOT DETECTED
Vibrio cholerae: NOT DETECTED
YERSINIA ENTEROCOLITICA: NOT DETECTED

## 2015-01-26 MED ORDER — ONDANSETRON 4 MG PO TBDP: 4.0000 mg | ORAL_TABLET | Freq: Three times a day (TID) | ORAL | Status: DC | PRN

## 2015-01-26 NOTE — Discharge Instructions (Signed)
Dehydration, Adult Dehydration is a condition in which you do not have enough fluid or water in your body. It happens when you take in less fluid than you lose. Vital organs such as the kidneys, brain, and heart cannot function without a proper amount of fluids. Any loss of fluids from the body can cause dehydration.  Dehydration can range from mild to severe. This condition should be treated right away to help prevent it from becoming severe. CAUSES  This condition may be caused by:  Vomiting.  Diarrhea.  Excessive sweating, such as when exercising in hot or humid weather.  Not drinking enough fluid during strenuous exercise or during an illness.  Excessive urine output.  Fever.  Certain medicines. RISK FACTORS This condition is more likely to develop in:  People who are taking certain medicines that cause the body to lose excess fluid (diuretics).   People who have a chronic illness, such as diabetes, that may increase urination.  Older adults.   People who live at high altitudes.   People who participate in endurance sports.  SYMPTOMS  Mild Dehydration  Thirst.  Dry lips.  Slightly dry mouth.  Dry, warm skin. Moderate Dehydration  Very dry mouth.   Muscle cramps.   Dark urine and decreased urine production.   Decreased tear production.   Headache.   Light-headedness, especially when you stand up from a sitting position.  Severe Dehydration  Changes in skin.   Cold and clammy skin.   Skin does not spring back quickly when lightly pinched and released.   Changes in body fluids.   Extreme thirst.   No tears.   Not able to sweat when body temperature is high, such as in hot weather.   Minimal urine production.   Changes in vital signs.   Rapid, weak pulse (more than 100 beats per minute when you are sitting still).   Rapid breathing.   Low blood pressure.   Other changes.   Sunken eyes.   Cold hands and feet.    Confusion.  Lethargy and difficulty being awakened.  Fainting (syncope).   Short-term weight loss.   Unconsciousness. DIAGNOSIS  This condition may be diagnosed based on your symptoms. You may also have tests to determine how severe your dehydration is. These tests may include:   Urine tests.   Blood tests.  TREATMENT  Treatment for this condition depends on the severity. Mild or moderate dehydration can often be treated at home. Treatment should be started right away. Do not wait until dehydration becomes severe. Severe dehydration needs to be treated at the hospital. Treatment for Mild Dehydration  Drinking plenty of water to replace the fluid you have lost.   Replacing minerals in your blood (electrolytes) that you may have lost.  Treatment for Moderate Dehydration  Consuming oral rehydration solution (ORS). Treatment for Severe Dehydration  Receiving fluid through an IV tube.   Receiving electrolyte solution through a feeding tube that is passed through your nose and into your stomach (nasogastric tube or NG tube).  Correcting any abnormalities in electrolytes. HOME CARE INSTRUCTIONS   Drink enough fluid to keep your urine clear or pale yellow.   Drink water or fluid slowly by taking small sips. You can also try sucking on ice cubes.  Have food or beverages that contain electrolytes. Examples include bananas and sports drinks.  Take over-the-counter and prescription medicines only as told by your health care provider.   Prepare ORS according to the manufacturer's instructions. Take sips  of ORS every 5 minutes until your urine returns to normal.  If you have vomiting or diarrhea, continue to try to drink water, ORS, or both.   If you have diarrhea, avoid:   Beverages that contain caffeine.   Fruit juice.   Milk.   Carbonated soft drinks.  Do not take salt tablets. This can lead to the condition of having too much sodium in your body  (hypernatremia).  SEEK MEDICAL CARE IF:  You cannot eat or drink without vomiting.  You have had moderate diarrhea during a period of more than 24 hours.  You have a fever. SEEK IMMEDIATE MEDICAL CARE IF:   You have extreme thirst.  You have severe diarrhea.  You have not urinated in 6-8 hours, or you have urinated only a small amount of very dark urine.  You have shriveled skin.  You are dizzy, confused, or both.   This information is not intended to replace advice given to you by your health care provider. Make sure you discuss any questions you have with your health care provider.   Document Released: 01/02/2005 Document Revised: 09/23/2014 Document Reviewed: 05/20/2014 Elsevier Interactive Patient Education 2016 [place].  Diarrhea Diarrhea is frequent loose and watery bowel movements. It can cause you to feel weak and dehydrated. Dehydration can cause you to become tired and thirsty, have a dry mouth, and have decreased urination that often is dark yellow. Diarrhea is a sign of another problem, most often an infection that will not last long. In most cases, diarrhea typically lasts 2-3 days. However, it can last longer if it is a sign of something more serious. It is important to treat your diarrhea as directed by your caregiver to lessen or prevent future episodes of diarrhea. CAUSES  Some common causes include:  Gastrointestinal infections caused by viruses, bacteria, or parasites.  Food poisoning or food allergies.  Certain medicines, such as antibiotics, chemotherapy, and laxatives.  Artificial sweeteners and fructose.  Digestive disorders. HOME CARE INSTRUCTIONS  Ensure adequate fluid intake (hydration): Have 1 cup (8 oz) of fluid for each diarrhea episode. Avoid fluids that contain simple sugars or sports drinks, fruit juices, whole milk products, and sodas. Your urine should be clear or pale yellow if you are drinking enough fluids. Hydrate with an oral  rehydration solution that you can purchase at pharmacies, retail stores, and online. You can prepare an oral rehydration solution at home by mixing the following ingredients together:   - tsp table salt.   tsp baking soda.   tsp salt substitute containing potassium chloride.  1  tablespoons sugar.  1 L (34 oz) of water.  Certain foods and beverages may increase the speed at which food moves through the gastrointestinal (GI) tract. These foods and beverages should be avoided and include:  Caffeinated and alcoholic beverages.  High-fiber foods, such as raw fruits and vegetables, nuts, seeds, and whole grain breads and cereals.  Foods and beverages sweetened with sugar alcohols, such as xylitol, sorbitol, and mannitol.  Some foods may be well tolerated and may help thicken stool including:  Starchy foods, such as rice, toast, pasta, low-sugar cereal, oatmeal, grits, baked potatoes, crackers, and bagels.  Bananas.  Applesauce.  Add probiotic-rich foods to help increase healthy bacteria in the GI tract, such as yogurt and fermented milk products.  Wash your hands well after each diarrhea episode.  Only take over-the-counter or prescription medicines as directed by your caregiver.  Take a warm bath to relieve any burning  or pain from frequent diarrhea episodes. SEEK IMMEDIATE MEDICAL CARE IF:   You are unable to keep fluids down.  You have persistent vomiting.  You have blood in your stool, or your stools are black and tarry.  You do not urinate in 6-8 hours, or there is only a small amount of very dark urine.  You have abdominal pain that increases or localizes.  You have weakness, dizziness, confusion, or light-headedness.  You have a severe headache.  Your diarrhea gets worse or does not get better.  You have a fever or persistent symptoms for more than 2-3 days.  You have a fever and your symptoms suddenly get worse. MAKE SURE YOU:   Understand these  instructions.  Will watch your condition.  Will get help right away if you are not doing well or get worse.   This information is not intended to replace advice given to you by your health care provider. Make sure you discuss any questions you have with your health care provider.   Document Released: 12/23/2001 Document Revised: 01/23/2014 Document Reviewed: 09/10/2011 Elsevier Interactive Patient Education 2016 Elsevier Inc.  Nausea and Vomiting Nausea is a sick feeling that often comes before throwing up (vomiting). Vomiting is a reflex where stomach contents come out of your mouth. Vomiting can cause severe loss of body fluids (dehydration). Children and elderly adults can become dehydrated quickly, especially if they also have diarrhea. Nausea and vomiting are symptoms of a condition or disease. It is important to find the cause of your symptoms. CAUSES   Direct irritation of the stomach lining. This irritation can result from increased acid production (gastroesophageal reflux disease), infection, food poisoning, taking certain medicines (such as nonsteroidal anti-inflammatory drugs), alcohol use, or tobacco use.  Signals from the brain.These signals could be caused by a headache, heat exposure, an inner ear disturbance, increased pressure in the brain from injury, infection, a tumor, or a concussion, pain, emotional stimulus, or metabolic problems.  An obstruction in the gastrointestinal tract (bowel obstruction).  Illnesses such as diabetes, hepatitis, gallbladder problems, appendicitis, kidney problems, cancer, sepsis, atypical symptoms of a heart attack, or eating disorders.  Medical treatments such as chemotherapy and radiation.  Receiving medicine that makes you sleep (general anesthetic) during surgery. DIAGNOSIS Your caregiver may ask for tests to be done if the problems do not improve after a few days. Tests may also be done if symptoms are severe or if the reason for the  nausea and vomiting is not clear. Tests may include:  Urine tests.  Blood tests.  Stool tests.  Cultures (to look for evidence of infection).  X-rays or other imaging studies. Test results can help your caregiver make decisions about treatment or the need for additional tests. TREATMENT You need to stay well hydrated. Drink frequently but in small amounts.You may wish to drink water, sports drinks, clear broth, or eat frozen ice pops or gelatin dessert to help stay hydrated.When you eat, eating slowly may help prevent nausea.There are also some antinausea medicines that may help prevent nausea. HOME CARE INSTRUCTIONS   Take all medicine as directed by your caregiver.  If you do not have an appetite, do not force yourself to eat. However, you must continue to drink fluids.  If you have an appetite, eat a normal diet unless your caregiver tells you differently.  Eat a variety of complex carbohydrates (rice, wheat, potatoes, bread), lean meats, yogurt, fruits, and vegetables.  Avoid high-fat foods because they are more difficult to  digest.  Drink enough water and fluids to keep your urine clear or pale yellow.  If you are dehydrated, ask your caregiver for specific rehydration instructions. Signs of dehydration may include:  Severe thirst.  Dry lips and mouth.  Dizziness.  Dark urine.  Decreasing urine frequency and amount.  Confusion.  Rapid breathing or pulse. SEEK IMMEDIATE MEDICAL CARE IF:   You have blood or brown flecks (like coffee grounds) in your vomit.  You have black or bloody stools.  You have a severe headache or stiff neck.  You are confused.  You have severe abdominal pain.  You have chest pain or trouble breathing.  You do not urinate at least once every 8 hours.  You develop cold or clammy skin.  You continue to vomit for longer than 24 to 48 hours.  You have a fever. MAKE SURE YOU:   Understand these instructions.  Will watch your  condition.  Will get help right away if you are not doing well or get worse.   This information is not intended to replace advice given to you by your health care provider. Make sure you discuss any questions you have with your health care provider.   Document Released: 01/02/2005 Document Revised: 03/27/2011 Document Reviewed: 06/01/2010 Elsevier Interactive Patient Education [employer].

## 2015-05-17 DIAGNOSIS — G35 Multiple sclerosis: Secondary | ICD-10-CM

## 2015-05-17 HISTORY — DX: Multiple sclerosis: G35

## 2015-05-18 ENCOUNTER — Telehealth: Payer: Self-pay | Admitting: Neurology

## 2015-05-18 MED ORDER — PREDNISONE 10 MG PO TABS: ORAL_TABLET | ORAL | Status: DC

## 2015-05-18 MED ORDER — PREDNISONE 10 MG PO TABS
ORAL_TABLET | ORAL | Status: DC
Start: 2015-05-18 — End: 2015-05-29

## 2015-05-18 NOTE — Telephone Encounter (Signed)
We can prescribe her a prednisone 10mg  tablet taper: Take 6tabs x1day, then 5tabs x1day, then 4tabs x1day, then 3tabs x1day, then 2tabs x1day, then 1tab x1day, then STOP.  She should not take NSAIDs while on the taper because it can upset stomach.  She needs to make a follow up, as I typically won't prescribe medications to patients that I haven't seen within a year.

## 2015-05-18 NOTE — Telephone Encounter (Signed)
PT states she has had a headache since  Thursday advil and over the counter stuff is not helping please call [phone]

## 2015-05-18 NOTE — Telephone Encounter (Signed)
RX sent in. Pt scheduled f/u.

## 2015-05-18 NOTE — Telephone Encounter (Signed)
Pt called with complaints of headache. Pt has not been seen or has had no communication w/ office since, 03/05/14 visit. You had requested a 6 month f/u. Please advise.

## 2015-05-25 ENCOUNTER — Emergency Department (HOSPITAL_COMMUNITY): Payer: Medicaid Other

## 2015-05-25 ENCOUNTER — Inpatient Hospital Stay (HOSPITAL_COMMUNITY)
Admission: EM | Admit: 2015-05-25 | Discharge: 2015-05-29 | DRG: 060 | Disposition: A | Discharge status: Home / Self Care | Payer: Medicaid Other | Attending: Internal Medicine | Admitting: Internal Medicine

## 2015-05-25 ENCOUNTER — Encounter (HOSPITAL_COMMUNITY): Payer: Self-pay | Admitting: *Deleted

## 2015-05-25 DIAGNOSIS — G43109 Migraine with aura, not intractable, without status migrainosus: Secondary | ICD-10-CM | POA: Diagnosis present

## 2015-05-25 DIAGNOSIS — Z885 Allergy status to narcotic agent status: Secondary | ICD-10-CM | POA: Diagnosis not present

## 2015-05-25 DIAGNOSIS — K219 Gastro-esophageal reflux disease without esophagitis: Secondary | ICD-10-CM | POA: Diagnosis present

## 2015-05-25 DIAGNOSIS — Z79899 Other long term (current) drug therapy: Secondary | ICD-10-CM

## 2015-05-25 DIAGNOSIS — M6289 Other specified disorders of muscle: Secondary | ICD-10-CM | POA: Diagnosis not present

## 2015-05-25 DIAGNOSIS — G35 Multiple sclerosis: Secondary | ICD-10-CM | POA: Diagnosis not present

## 2015-05-25 DIAGNOSIS — D72829 Elevated white blood cell count, unspecified: Secondary | ICD-10-CM | POA: Diagnosis not present

## 2015-05-25 DIAGNOSIS — Z888 Allergy status to other drugs, medicaments and biological substances status: Secondary | ICD-10-CM | POA: Diagnosis not present

## 2015-05-25 DIAGNOSIS — R531 Weakness: Secondary | ICD-10-CM

## 2015-05-25 DIAGNOSIS — R000 Tachycardia, unspecified: Secondary | ICD-10-CM | POA: Diagnosis present

## 2015-05-25 DIAGNOSIS — G8194 Hemiplegia, unspecified affecting left nondominant side: Secondary | ICD-10-CM | POA: Diagnosis not present

## 2015-05-25 DIAGNOSIS — G35D Multiple sclerosis, unspecified: Secondary | ICD-10-CM | POA: Diagnosis present

## 2015-05-25 DIAGNOSIS — R51 Headache: Secondary | ICD-10-CM

## 2015-05-25 DIAGNOSIS — F4322 Adjustment disorder with anxiety: Secondary | ICD-10-CM | POA: Diagnosis present

## 2015-05-25 DIAGNOSIS — R519 Headache, unspecified: Secondary | ICD-10-CM | POA: Diagnosis present

## 2015-05-25 DIAGNOSIS — G459 Transient cerebral ischemic attack, unspecified: Secondary | ICD-10-CM | POA: Diagnosis not present

## 2015-05-25 HISTORY — DX: Multiple sclerosis: G35

## 2015-05-25 LAB — CBC
HEMATOCRIT: 46.6 % — AB (ref 36.0–46.0)
HEMOGLOBIN: 16.2 g/dL — AB (ref 12.0–15.0)
MCH: 31.7 pg (ref 26.0–34.0)
MCHC: 34.8 g/dL (ref 30.0–36.0)
MCV: 91.2 fL (ref 78.0–100.0)
Platelets: 270 10*3/uL (ref 150–400)
RBC: 5.11 MIL/uL (ref 3.87–5.11)
RDW: 12.4 % (ref 11.5–15.5)
WBC: 8.8 10*3/uL (ref 4.0–10.5)

## 2015-05-25 LAB — PROTIME-INR
INR: 1.1 (ref 0.00–1.49)
Prothrombin Time: 14.4 seconds (ref 11.6–15.2)

## 2015-05-25 LAB — URINALYSIS, ROUTINE W REFLEX MICROSCOPIC
BILIRUBIN URINE: NEGATIVE
GLUCOSE, UA: NEGATIVE mg/dL
HGB URINE DIPSTICK: NEGATIVE
KETONES UR: NEGATIVE mg/dL
LEUKOCYTES UA: NEGATIVE
Nitrite: NEGATIVE
PROTEIN: NEGATIVE mg/dL
Specific Gravity, Urine: 1.011 (ref 1.005–1.030)
pH: 7 (ref 5.0–8.0)

## 2015-05-25 LAB — DIFFERENTIAL
BASOS ABS: 0 10*3/uL (ref 0.0–0.1)
Basophils Relative: 0 %
EOS PCT: 2 %
Eosinophils Absolute: 0.2 10*3/uL (ref 0.0–0.7)
Lymphocytes Relative: 26 %
Lymphs Abs: 2.3 10*3/uL (ref 0.7–4.0)
MONO ABS: 1 10*3/uL (ref 0.1–1.0)
MONOS PCT: 11 %
Neutro Abs: 5.3 10*3/uL (ref 1.7–7.7)
Neutrophils Relative %: 61 %

## 2015-05-25 LAB — I-STAT CHEM 8, ED
BUN: 12 mg/dL (ref 6–20)
CHLORIDE: 105 mmol/L (ref 101–111)
CREATININE: 0.7 mg/dL (ref 0.44–1.00)
Calcium, Ion: 1.1 mmol/L — ABNORMAL LOW (ref 1.12–1.23)
GLUCOSE: 77 mg/dL (ref 65–99)
HCT: 51 % — ABNORMAL HIGH (ref 36.0–46.0)
Hemoglobin: 17.3 g/dL — ABNORMAL HIGH (ref 12.0–15.0)
POTASSIUM: 4.3 mmol/L (ref 3.5–5.1)
Sodium: 140 mmol/L (ref 135–145)
TCO2: 25 mmol/L (ref 0–100)

## 2015-05-25 LAB — APTT: APTT: 30 s (ref 24–37)

## 2015-05-25 LAB — RAPID URINE DRUG SCREEN, HOSP PERFORMED
AMPHETAMINES: NOT DETECTED
BARBITURATES: NOT DETECTED
Benzodiazepines: NOT DETECTED
COCAINE: NOT DETECTED
Opiates: NOT DETECTED
TETRAHYDROCANNABINOL: NOT DETECTED

## 2015-05-25 LAB — COMPREHENSIVE METABOLIC PANEL
ALBUMIN: 4.3 g/dL (ref 3.5–5.0)
ALK PHOS: 39 U/L (ref 38–126)
ALT: 18 U/L (ref 14–54)
AST: 26 U/L (ref 15–41)
Anion gap: 10 (ref 5–15)
BILIRUBIN TOTAL: 1 mg/dL (ref 0.3–1.2)
BUN: 10 mg/dL (ref 6–20)
CO2: 23 mmol/L (ref 22–32)
Calcium: 9.4 mg/dL (ref 8.9–10.3)
Chloride: 107 mmol/L (ref 101–111)
Creatinine, Ser: 0.78 mg/dL (ref 0.44–1.00)
GFR calc Af Amer: >60 mL/min (ref >60)
GFR calc non Af Amer: >60 mL/min (ref >60)
GLUCOSE: 79 mg/dL (ref 65–99)
POTASSIUM: 4.2 mmol/L (ref 3.5–5.1)
Sodium: 140 mmol/L (ref 135–145)
TOTAL PROTEIN: 6.9 g/dL (ref 6.5–8.1)

## 2015-05-25 LAB — I-STAT TROPONIN, ED: Troponin i, poc: 0 ng/mL (ref 0.00–0.08)

## 2015-05-25 LAB — ETHANOL: Alcohol, Ethyl (B): <5 mg/dL (ref <5)

## 2015-05-25 MED ORDER — TRAMADOL HCL 50 MG PO TABS
50.0000 mg | ORAL_TABLET | Freq: Four times a day (QID) | ORAL | Status: DC | PRN
  Administered 2015-05-26 – 2015-05-27 (×4): 50 mg via ORAL
  Filled 2015-05-25 (×5): qty 1

## 2015-05-25 MED ORDER — ASPIRIN 300 MG RE SUPP: 300.0000 mg | Freq: Every day | RECTAL | Status: DC

## 2015-05-25 MED ORDER — DIPHENHYDRAMINE HCL 50 MG/ML IJ SOLN
25.0000 mg | Freq: Once | INTRAMUSCULAR | Status: AC
  Administered 2015-05-25: 25 mg via INTRAVENOUS
  Filled 2015-05-25: qty 1

## 2015-05-25 MED ORDER — STROKE: EARLY STAGES OF RECOVERY BOOK
Freq: Once | Status: AC
  Administered 2015-05-25: 1
  Filled 2015-05-25: qty 1

## 2015-05-25 MED ORDER — PROCHLORPERAZINE MALEATE 10 MG PO TABS
10.0000 mg | ORAL_TABLET | Freq: Once | ORAL | Status: DC
  Filled 2015-05-25: qty 1

## 2015-05-25 MED ORDER — ONDANSETRON HCL 4 MG/2ML IJ SOLN
4.0000 mg | Freq: Once | INTRAMUSCULAR | Status: AC
  Administered 2015-05-25: 8 mg via INTRAVENOUS

## 2015-05-25 MED ORDER — PROCHLORPERAZINE EDISYLATE 5 MG/ML IJ SOLN
10.0000 mg | Freq: Once | INTRAMUSCULAR | Status: AC
  Administered 2015-05-25: 10 mg via INTRAVENOUS

## 2015-05-25 MED ORDER — ASPIRIN 325 MG PO TABS
325.0000 mg | ORAL_TABLET | Freq: Every day | ORAL | Status: DC
  Administered 2015-05-25 – 2015-05-28 (×3): 325 mg via ORAL
  Filled 2015-05-25 (×4): qty 1

## 2015-05-25 MED ORDER — METOPROLOL TARTRATE 25 MG PO TABS
25.0000 mg | ORAL_TABLET | Freq: Two times a day (BID) | ORAL | Status: DC
  Administered 2015-05-25 – 2015-05-29 (×8): 25 mg via ORAL
  Filled 2015-05-25 (×8): qty 1

## 2015-05-25 MED ORDER — KETOROLAC TROMETHAMINE 30 MG/ML IJ SOLN
30.0000 mg | Freq: Once | INTRAMUSCULAR | Status: AC
  Administered 2015-05-25: 30 mg via INTRAVENOUS
  Filled 2015-05-25: qty 1

## 2015-05-25 MED ORDER — SODIUM CHLORIDE 0.9 % IV SOLN: 8.0000 mg | Freq: Once | INTRAVENOUS | Status: DC

## 2015-05-25 MED ORDER — SODIUM CHLORIDE 0.9 % IV SOLN
8.0000 mg | Freq: Once | INTRAVENOUS | Status: DC
  Filled 2015-05-25: qty 4

## 2015-05-25 MED ORDER — SODIUM CHLORIDE 0.9 % IV SOLN
8.0000 mg | Freq: Four times a day (QID) | INTRAVENOUS | Status: DC | PRN
  Filled 2015-05-25 (×2): qty 4

## 2015-05-25 MED ORDER — ENOXAPARIN SODIUM 40 MG/0.4ML ~~LOC~~ SOLN
40.0000 mg | SUBCUTANEOUS | Status: DC
  Administered 2015-05-25: 40 mg via SUBCUTANEOUS
  Filled 2015-05-25: qty 0.4

## 2015-05-25 MED ORDER — ALPRAZOLAM 0.25 MG PO TABS
0.2500 mg | ORAL_TABLET | Freq: Three times a day (TID) | ORAL | Status: DC | PRN
  Administered 2015-05-25 – 2015-05-26 (×2): 0.25 mg via ORAL
  Filled 2015-05-25 (×3): qty 1

## 2015-05-25 MED ORDER — GADOBENATE DIMEGLUMINE 529 MG/ML IV SOLN
10.0000 mL | Freq: Once | INTRAVENOUS | Status: AC
  Administered 2015-05-25: 9 mL via INTRAVENOUS

## 2015-05-25 MED ORDER — SODIUM CHLORIDE 0.9 % IV SOLN
1000.0000 mg | Freq: Every day | INTRAVENOUS | Status: DC
  Administered 2015-05-26 – 2015-05-28 (×3): 1000 mg via INTRAVENOUS
  Filled 2015-05-25 (×4): qty 8

## 2015-05-25 MED ORDER — ONDANSETRON HCL 4 MG/2ML IJ SOLN
4.0000 mg | Freq: Once | INTRAMUSCULAR | Status: DC
  Filled 2015-05-25: qty 2

## 2015-05-25 MED ORDER — SODIUM CHLORIDE 0.9 % IV SOLN
1000.0000 mg | Freq: Once | INTRAVENOUS | Status: AC
  Administered 2015-05-25: 1000 mg via INTRAVENOUS
  Filled 2015-05-25: qty 8

## 2015-05-25 NOTE — Code Documentation (Signed)
25yo female arriving to [facility] via [place] at [phone].  EMS reports that the patient has had a migraine x several days and fell this morning.  Patient's child called her father who called 80.  EMS assessed patient to have left facial droop, right sided weakness, headache and neck pain.  Patient reports neck pain prior to the fall.  Patient with h/o SVT, hypertension and migraines.  Of note, patient with h/o similar episode of weakness in the setting of migraine.  Stroke team at the bedside on patient arrival.  Labs drawn and patient cleared for CT by Dr. Dayna Barker.  Patient to CT with team.  NIHSS 7, see documentation for details and code stroke times.  Patient with left facial droop, right sided weakness and mild dysarthria.  Patient continues to c/o headache and neck pain.  PIV started after CT completed.  Patient to MRI per Dr. Leonel Ramsay.  MRI reviewed by Dr. Leonel Ramsay.  No acute stroke treatment at this time.  Patient transported back to A7.  Bedside handoff with ED RN Elmyra Ricks.

## 2015-05-25 NOTE — ED Notes (Signed)
Pt arrives from home via [facility]. Pt had a LOC this morning and when she became alert she was experiencing right sided weakness with a left sided facial droop and a h/a. Pt states she has a hx of migraines and SVT, and has had weakness with migraines in the past. Pt states she has had this migraine x4 days.

## 2015-05-25 NOTE — H&P (Signed)
History and Physical    Amy Rivas [id number] DOB: 06-May-1990 DOA: 05/25/2015  Referring MD/NP/PA: Reather Converse / ER PCP: Osa Craver, MD  Outpatient Specialists: Tomi Likens / Neurology; Sondra Barges / [place], Cardiology  Patient coming from: Private residence  Chief Complaint: Intractable headache 2 weeks and right-sided weakness  HPI: Amy Rivas is a 25 y.o. 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 Patient presented to the ER after experiencing a headache for 2 weeks that has been unresponsive to prednisone taper in outpatient setting. This is associated with photophobia. She developed right side weakness without numbness and left facial drooping this morning.  ED Course:  Afebrile-BP 122/92-pulse 89-respirations 14-room air saturations 100% CT head without contrast: Study within normal limits MRI brain with and without contrast: Constellation of white matter lesions most consistent with demyelinating disease. Ventricular predominance and interval development since January 2016 suggested subacute to chronic time course. There are slow equivocal diffusion restriction on today's imaging in conjunction with right-sided weakness suggesting that the largest ill-defined left periventricular lesion may be more acute. MR cervical spine without contrast and MRA head without contrast pending Lab data: Sodium 140, potassium 4.3, BUN 12, creatinine 0.7, glucose 77, troponin 0.00, white count 8800 with normal neutrophils, hemoglobin 16.2, platelets 270,000; urinalysis unremarkable; urine drug screen unremarkable, call level less than 5 Toradol 30 mg IV 1 Benadryl 25 mg IV 1 Compazine 10 mg IV 1 Solu-Medrol 1 g IV 1  Review of Systems:  In addition to the HPI above,  No Fever-chills, myalgias or other constitutional symptoms No changes with hearing although she  reports occasional blurred vision with headache, new tingling, numbness in any extremity although she does have new right upper extremity and lower extremity weakness and left facial drooping less than 24 hours in duration No problems swallowing food or Liquids, indigestion/reflux No Chest pain, Cough or Shortness of Breath, palpitations, orthopnea or DOE No Abdominal pain, N/V; no melena or hematochezia, no dark tarry stools, Bowel movements are regular, No dysuria, hematuria or flank pain No new skin rashes, lesions, masses or bruises, No new joints pains-aches No recent weight gain or loss No polyuria, polydypsia or polyphagia,   Past Medical History  Diagnosis Date  . GERD (gastroesophageal reflux disease)   . Anxiety   . Headache   . PSVT (paroxysmal supraventricular tachycardia) [facility])     Past Surgical History  Procedure Laterality Date  . Tympanostomy tube placement  1991, 1993, 2003  . Tonsillectomy and adenoidectomy  1992  . Laparoscopic cholecystectomy  2008  . Cesarean section  2010, 2012     reports that she has never smoked. She has never used smokeless tobacco. She reports that she does not drink alcohol or use illicit drugs.  Allergies  Allergen Reactions  . Albuterol Anaphylaxis  . Hydrocodone Anaphylaxis and Nausea And Vomiting  . Codeine Nausea And Vomiting  . Lortab [Hydrocodone-Acetaminophen] Nausea And Vomiting    Patient can tolerate acetaminophen solely  . Morphine And Related Swelling and Rash    Family History  Problem Relation Age of Onset  . Lung cancer Maternal Grandmother   . Heart disease Mother   . Diabetes Father   . Cancer Maternal Uncle   . Diabetes Maternal Grandmother   . Asthma Maternal Grandmother   . Diabetes Maternal Grandfather   . Diabetes Paternal Grandmother   . Diabetes Paternal Grandfather   . Breast cancer Maternal  Grandmother      Prior to Admission medications   Medication Sig Start Date End Date Taking?  Authorizing Provider  acetaminophen (TYLENOL) 500 MG tablet Take 1,000 mg by mouth every 6 (six) hours as needed for fever.   Yes Historical Provider, MD  ibuprofen (ADVIL,MOTRIN) 200 MG tablet Take 200 mg by mouth every 6 (six) hours as needed.   Yes Historical Provider, MD  ketoprofen (ORUDIS) 50 MG capsule Take 50 mg by mouth 4 (four) times daily as needed for mild pain.   Yes Historical Provider, MD  metoprolol tartrate (LOPRESSOR) 25 MG tablet Take 25 mg by mouth 2 (two) times daily. 05/03/15  Yes Historical Provider, MD  diphenhydrAMINE (BENADRYL) 25 MG tablet Take 1 tablet (25 mg total) by mouth every 6 (six) hours. Patient not taking: Reported on 03/05/2014 01/24/14   Starlyn Skeans, PA-C  metoCLOPramide (REGLAN) 10 MG tablet Take 1 tablet (10 mg total) by mouth every 6 (six) hours as needed for nausea (nausea/headache). Patient not taking: Reported on 03/05/2014 01/24/14   Loma Sousa Forcucci, PA-C  ondansetron (ZOFRAN ODT) 4 MG disintegrating tablet Take 1 tablet (4 mg total) by mouth every 8 (eight) hours as needed for nausea or vomiting. Patient not taking: Reported on 05/25/2015 01/26/15   Esaw Grandchild, MD  predniSONE (DELTASONE) 10 MG tablet Day 1 take 6 pills Day 2 take 5 pills Day 3 take 4 pills Day 4 take 3 pills Day 5 take 2 pills Day 6 take 1 pill Patient not taking: Reported on 03/05/2014 01/24/14   Loma Sousa Forcucci, PA-C  predniSONE (DELTASONE) 10 MG tablet 10mg  tablets. Take 6tabs x1day, then 5tabs x1day, then 4tabs x1day, then 3tabs x1day, then 2tabs x1day, then 1tab x1day, then STOP Patient not taking: Reported on 05/25/2015 05/18/15   Pieter Partridge, DO    Physical Exam: Filed Vitals:   05/25/15 0959 05/25/15 1145 05/25/15 1215 05/25/15 1245  BP:  135/100 130/99 122/92  Pulse:  92 80 81  Resp:  14 13 16   Weight: 101 lb 13.6 oz (46.2 kg)     SpO2:  100% 98% 99%      Constitutional: NAD, calm, comfortableAlthough somewhat anxious regarding current symptoms and potential  diagnoses Eyes: PERRL, lids and conjunctivae normal ENMT: Mucous membranes are moist. Posterior pharynx clear of any exudate or lesions.Normal dentition.  Neck: normal, supple, no masses, no thyromegaly Respiratory: clear to auscultation bilaterally, no wheezing, no crackles. Normal respiratory effort. No accessory muscle use.  Cardiovascular: Regular rate and rhythm, no murmurs / rubs / gallops. No extremity edema. 2+ pedal pulses. No carotid bruits.  Abdomen: no tenderness, no masses palpated. No hepatosplenomegaly. Bowel sounds positive.  Musculoskeletal: no clubbing / cyanosis. No joint deformity upper and lower extremities. Good ROM, no contractures. Normal muscle tone.  Skin: no rashes, lesions, ulcers. No induration Neurologic: CN 2-12 grossly intact except for noted left facial drooping when tested. Sensation intact, DTR normal. Strength 5/5 on left side, strength 3-4/5 on right side with noted diminished grip, difficulty lifting her right arm off bed, difficulty performing right heel-to-shin maneuver Psychiatric: Normal judgment and insight. Alert and oriented x 3. Normal mood.    Labs on Admission: I have personally reviewed following labs and imaging studies  CBC:  Recent Labs Lab 05/25/15 0937 05/25/15 0942  WBC 8.8  --   NEUTROABS 5.3  --   HGB 16.2* 17.3*  HCT 46.6* 51.0*  MCV 91.2  --   PLT 270  --    Basic  Metabolic Panel:  Recent Labs Lab 05/25/15 0937 05/25/15 0942  NA 140 140  K 4.2 4.3  CL 107 105  CO2 23  --   GLUCOSE 79 77  BUN 10 12  CREATININE 0.78 0.70  CALCIUM 9.4  --    GFR: CrCl cannot be calculated (Unknown ideal weight.). Liver Function Tests:  Recent Labs Lab 05/25/15 0937  AST 26  ALT 18  ALKPHOS 39  BILITOT 1.0  PROT 6.9  ALBUMIN 4.3   No results for input(s): LIPASE, AMYLASE in the last 168 hours. No results for input(s): AMMONIA in the last 168 hours. Coagulation Profile:  Recent Labs Lab 05/25/15 0937  INR 1.10    Cardiac Enzymes: No results for input(s): CKTOTAL, CKMB, CKMBINDEX, TROPONINI in the last 168 hours. BNP (last 3 results) No results for input(s): PROBNP in the last 8760 hours. HbA1C: No results for input(s): HGBA1C in the last 72 hours. CBG: No results for input(s): GLUCAP in the last 168 hours. Lipid Profile: No results for input(s): CHOL, HDL, LDLCALC, TRIG, CHOLHDL, LDLDIRECT in the last 72 hours. Thyroid Function Tests: No results for input(s): TSH, T4TOTAL, FREET4, T3FREE, THYROIDAB in the last 72 hours. Anemia Panel: No results for input(s): VITAMINB12, FOLATE, FERRITIN, TIBC, IRON, RETICCTPCT in the last 72 hours. Urine analysis:    Component Value Date/Time   COLORURINE YELLOW 05/25/2015 1150   APPEARANCEUR CLEAR 05/25/2015 1150   LABSPEC 1.011 05/25/2015 1150   PHURINE 7.0 05/25/2015 1150   GLUCOSEU NEGATIVE 05/25/2015 1150   HGBUR NEGATIVE 05/25/2015 [place] 05/25/2015 1150   KETONESUR NEGATIVE 05/25/2015 1150   PROTEINUR NEGATIVE 05/25/2015 1150   UROBILINOGEN 0.2 01/22/2014 1606   NITRITE NEGATIVE 05/25/2015 1150   LEUKOCYTESUR NEGATIVE 05/25/2015 1150   Sepsis Labs: @LABRCNTIP (procalcitonin:4,lacticidven:4) )No results found for this or any previous visit (from the past 240 hour(s)).   Radiological Exams on Admission: Ct Head Wo Contrast  05/25/2015  CLINICAL DATA:  New onset right-sided weakness and left facial droop. Headache. EXAM: CT HEAD WITHOUT CONTRAST TECHNIQUE: Contiguous axial images were obtained from the base of the skull through the vertex without intravenous contrast. COMPARISON:  Head CT January 22, 2014 and brain MRI  January 24, 2014 FINDINGS: The ventricles are normal in size and configuration. There is no intracranial mass, hemorrhage, extra-axial fluid collection, or midline shift. Gray-white compartments appear normal. No acute infarct evident. The middle cerebral artery attenuation is normal bilaterally. Bony calvarium  appears intact. The mastoid air cells are clear. No intraorbital lesions are evident. IMPRESSION: Study within normal limits. Critical Value/emergent results were called by telephone at the time of interpretation on 05/25/2015 at 9:51 am to Dr. Roland Rack, who verbally acknowledged these results. Electronically Signed   By: Lowella Grip III M.D.   On: 05/25/2015 09:52   Mr Jeri Cos [id number] Contrast  05/25/2015  CLINICAL DATA:  RIGHT-sided weakness. LEFT facial droop upon waking. Headache for 2 weeks. Neck pain. EXAM: MRI HEAD WITHOUT AND WITH CONTRAST TECHNIQUE: Multiplanar, multiecho pulse sequences of the brain and surrounding structures were obtained without and with intravenous contrast. CONTRAST:  39mL MULTIHANCE GADOBENATE DIMEGLUMINE 529 MG/ML IV SOLN COMPARISON:  CT head earlier today.  MR head 01/24/2014. FINDINGS: Single focus of mildly restricted +/- facilitated diffusion, LEFT centrum semiovale, corresponding to a 1 cm area of dominant ill-defined periventricular white matter signal abnormality on T2 and FLAIR imaging. Smaller periventricular greater than subcortical supratentorial lesions elsewhere on T2 and FLAIR, inapparent on diffusion imaging.  No postcontrast enhancement at any location. No involvement of the cerebellum or brainstem. No cortical lesions. No evidence for acute stroke or hemorrhage. No extra-axial fluid collection. Normal cerebral volume upper cervical region unremarkable. No tonsillar herniation. Pituitary. Extracranial soft tissues unremarkable. Post infusion imaging through the entire brain demonstrates no abnormal enhancement. Compared with 2016, white matter lesions were not present. IMPRESSION: Constellation of white matter lesions most consistent with demyelinating disease. Periventricular predominance, and interval development since January 2016 suggest a subacute to chronic time course. Equivocal diffusion restriction on today's imaging, in conjunction with RIGHT-sided  weakness, suggesting that the largest, ill-defined, LEFT periventricular lesion may be more acute. No associated postcontrast enhancement however is demonstrated. Electronically Signed   By: Staci Righter M.D.   On: 05/25/2015 11:58    EKG: (Independently reviewed) sinus rhythm with ventricular rate 80 bpm, QTC 429 ms, no ischemic changes  Assessment/Plan Principal Problem:   Acute right-sided weakness/possible new dx Multiple sclerosis with exacerbation  -Patient has a constellation of symptoms that could possibly be explained by complex migraine with known history of prior hemiplegic migraine; MRI also has findings concerning for possible new diagnosis of multiple sclerosis and current symptomatology could potentially be reflective of an acute exacerbation -At this juncture we'll continue to treat as TIA/CVA rule out workup noting that neurology has ordered additional imaging to rule out vascular etiology to patient's symptoms -ECHO -PT/OT/SLP evaluation -Lipid panel and A1c -Aspirin -IV steroids ordered to treat potential MS flare and associated headache  Active Problems:   Intractable headache -Unclear if related to known history of migraines or this is reflective of potential diagnosis of MS with acute flare -Treatment as above -Headache has not resolved with treatment in ER    Sinus tachycardia  -Continue with preadmission beta blocker      DVT prophylaxis: Lovenox  Code Status: Full code Family Communication: Husband Patsie Prochazka at bedside Disposition Plan: Discharge back to preadmission home environment Consults called: [place]/neurology Admission status: Inpatient/telemetry  Mobility: Prior to development of current symptomatology patient mobilized without the aid of assistive devices   Izsak Meir L. ANP-BC [facility] Pager [phone]   If 7PM-7AM, please contact night-coverage [email or web address] Password [email or web address]  05/25/2015, 1:23 PM

## 2015-05-25 NOTE — ED Provider Notes (Signed)
CSN: [id number]     Arrival date & time 05/25/15  0933 History   First MD Initiated Contact with Patient 05/25/15 [phone]     Chief Complaint  Patient presents with  . Code Stroke    @EDPCLEARED @  HPI   25 year old female presents today with numerous complaints. Prior to my evaluation patient had been seen by both neurology and Dr. Leonel Ramsay, and Dorise Bullion M.D. emergency medicine. Patient reports that for the past several weeks she's had a throbbing headache associated with photophobia and visual aura. She reports this is been waxing and waning, but today noted that she was feeling worse with right sided strength weaknesses and left-sided facial droop. She denies loss of bowel or bladder functioning, denies loss of sensation to the extremities, reports that she is able to move the extremities but with decreased strength. She denies any trauma to the head neck or back. Denies any fever.  Past Medical History  Diagnosis Date  . GERD (gastroesophageal reflux disease)   . Anxiety   . Headache   . PSVT (paroxysmal supraventricular tachycardia) [facility])    Past Surgical History  Procedure Laterality Date  . Tympanostomy tube placement  1991, 1993, 2003  . Tonsillectomy and adenoidectomy  1992  . Laparoscopic cholecystectomy  2008  . Cesarean section  2010, 2012   Family History  Problem Relation Age of Onset  . Lung cancer Maternal Grandmother   . Heart disease Mother   . Diabetes Father   . Cancer Maternal Uncle   . Diabetes Maternal Grandmother   . Asthma Maternal Grandmother   . Diabetes Maternal Grandfather   . Diabetes Paternal Grandmother   . Diabetes Paternal Grandfather   . Breast cancer Maternal Grandmother    Social History  Substance Use Topics  . Smoking status: Never Smoker   . Smokeless tobacco: Never Used  . Alcohol Use: No   OB History    Gravida Para Term Preterm AB TAB SAB Ectopic Multiple Living   2 2  2            Review of Systems  All other systems  reviewed and are negative.    Allergies  Albuterol; Hydrocodone; Codeine; Lortab; and Morphine and related  Home Medications   Prior to Admission medications   Medication Sig Start Date End Date Taking? Authorizing Provider  acetaminophen (TYLENOL) 500 MG tablet Take 1,000 mg by mouth every 6 (six) hours as needed for fever.   Yes Historical Provider, MD  ibuprofen (ADVIL,MOTRIN) 200 MG tablet Take 200 mg by mouth every 6 (six) hours as needed.   Yes Historical Provider, MD  ketoprofen (ORUDIS) 50 MG capsule Take 50 mg by mouth 4 (four) times daily as needed for mild pain.   Yes Historical Provider, MD  metoprolol tartrate (LOPRESSOR) 25 MG tablet Take 25 mg by mouth 2 (two) times daily. 05/03/15  Yes Historical Provider, MD  diphenhydrAMINE (BENADRYL) 25 MG tablet Take 1 tablet (25 mg total) by mouth every 6 (six) hours. Patient not taking: Reported on 03/05/2014 01/24/14   Starlyn Skeans, PA-C  metoCLOPramide (REGLAN) 10 MG tablet Take 1 tablet (10 mg total) by mouth every 6 (six) hours as needed for nausea (nausea/headache). Patient not taking: Reported on 03/05/2014 01/24/14   Loma Sousa Forcucci, PA-C  ondansetron (ZOFRAN ODT) 4 MG disintegrating tablet Take 1 tablet (4 mg total) by mouth every 8 (eight) hours as needed for nausea or vomiting. Patient not taking: Reported on 05/25/2015 01/26/15   Esaw Grandchild, MD  predniSONE (DELTASONE) 10 MG tablet Day 1 take 6 pills Day 2 take 5 pills Day 3 take 4 pills Day 4 take 3 pills Day 5 take 2 pills Day 6 take 1 pill Patient not taking: Reported on 03/05/2014 01/24/14   Loma Sousa Forcucci, PA-C  predniSONE (DELTASONE) 10 MG tablet 10mg  tablets. Take 6tabs x1day, then 5tabs x1day, then 4tabs x1day, then 3tabs x1day, then 2tabs x1day, then 1tab x1day, then STOP Patient not taking: Reported on 05/25/2015 05/18/15   Pieter Partridge, DO   BP 129/95 mmHg  Pulse 85  Resp 17  Wt 46.2 kg  SpO2 100%   Physical Exam  Constitutional: She appears well-developed  and well-nourished. No distress.  Pulmonary/Chest: Effort normal and breath sounds normal. No respiratory distress. She has no wheezes. She has no rales. She exhibits no tenderness.  Abdominal: Soft. She exhibits no mass. There is no tenderness. There is no rebound and no guarding.  Neurological: No cranial nerve deficit or sensory deficit. GCS eye subscore is 4. GCS verbal subscore is 5. GCS motor subscore is 6.  Reflex Scores:      Patellar reflexes are 2+ on the right side and 2+ on the left side. Patient has right-sided 3 out of 5 strength, 5 out of 5 left-sided major muscle group strength. Sensation intact throughout dermatomes, patient has left-sided facial droop and slurred speech.  Skin: Skin is warm and dry. No rash noted. She is not diaphoretic. No erythema. No pallor.  Nursing note and vitals reviewed.   ED Course  Procedures (including critical care time) Labs Review Labs Reviewed  CBC - Abnormal; Notable for the following:    Hemoglobin 16.2 (*)    HCT 46.6 (*)    All other components within normal limits  I-STAT CHEM 8, ED - Abnormal; Notable for the following:    Calcium, Ion 1.10 (*)    Hemoglobin 17.3 (*)    HCT 51.0 (*)    All other components within normal limits  ETHANOL  PROTIME-INR  APTT  DIFFERENTIAL  COMPREHENSIVE METABOLIC PANEL  URINE RAPID DRUG SCREEN, HOSP PERFORMED  URINALYSIS, ROUTINE W REFLEX MICROSCOPIC (NOT AT [facility])  I-STAT TROPOININ, ED    Imaging Review Ct Head Wo Contrast  05/25/2015  CLINICAL DATA:  New onset right-sided weakness and left facial droop. Headache. EXAM: CT HEAD WITHOUT CONTRAST TECHNIQUE: Contiguous axial images were obtained from the base of the skull through the vertex without intravenous contrast. COMPARISON:  Head CT January 22, 2014 and brain MRI  January 24, 2014 FINDINGS: The ventricles are normal in size and configuration. There is no intracranial mass, hemorrhage, extra-axial fluid collection, or midline shift. Gray-white  compartments appear normal. No acute infarct evident. The middle cerebral artery attenuation is normal bilaterally. Bony calvarium appears intact. The mastoid air cells are clear. No intraorbital lesions are evident. IMPRESSION: Study within normal limits. Critical Value/emergent results were called by telephone at the time of interpretation on 05/25/2015 at 9:51 am to Dr. Roland Rack, who verbally acknowledged these results. Electronically Signed   By: Lowella Grip III M.D.   On: 05/25/2015 09:52   Mr Jeri Cos [id number] Contrast  05/25/2015  CLINICAL DATA:  RIGHT-sided weakness. LEFT facial droop upon waking. Headache for 2 weeks. Neck pain. EXAM: MRI HEAD WITHOUT AND WITH CONTRAST TECHNIQUE: Multiplanar, multiecho pulse sequences of the brain and surrounding structures were obtained without and with intravenous contrast. CONTRAST:  70mL MULTIHANCE GADOBENATE DIMEGLUMINE 529 MG/ML IV SOLN COMPARISON:  CT head earlier  today.  MR head 01/24/2014. FINDINGS: Single focus of mildly restricted +/- facilitated diffusion, LEFT centrum semiovale, corresponding to a 1 cm area of dominant ill-defined periventricular white matter signal abnormality on T2 and FLAIR imaging. Smaller periventricular greater than subcortical supratentorial lesions elsewhere on T2 and FLAIR, inapparent on diffusion imaging. No postcontrast enhancement at any location. No involvement of the cerebellum or brainstem. No cortical lesions. No evidence for acute stroke or hemorrhage. No extra-axial fluid collection. Normal cerebral volume upper cervical region unremarkable. No tonsillar herniation. Pituitary. Extracranial soft tissues unremarkable. Post infusion imaging through the entire brain demonstrates no abnormal enhancement. Compared with 2016, white matter lesions were not present. IMPRESSION: Constellation of white matter lesions most consistent with demyelinating disease. Periventricular predominance, and interval development since January  2016 suggest a subacute to chronic time course. Equivocal diffusion restriction on today's imaging, in conjunction with RIGHT-sided weakness, suggesting that the largest, ill-defined, LEFT periventricular lesion may be more acute. No associated postcontrast enhancement however is demonstrated. Electronically Signed   By: Staci Righter M.D.   On: 05/25/2015 11:58   I have personally reviewed and evaluated these images and lab results as part of my medical decision-making.   EKG Interpretation None      MDM   Final diagnoses:  Multiple sclerosis ([place])    Labs: I-STAT Chem-8, i-STAT troponin, ethanol, PT/INR, APTT, CBC, differential, CMP  Imaging: MRI brain with and without contrast, MRI cervical without contrast CT head without  Consults:  Therapeutics:Solu-Medrol  Discharge Meds:   Assessment/Plan: A 25 year old female presents today with Likely MS flare. Patient has findings on MRI consistent with demyelination disease. She continues to endorse right-sided weakness, left-sided facial paralysis. Patient was given a headache cocktail here in the ED which did not improve her headache symptoms. Due to patient's persistence of symptoms, new onset, she will be admitted to the hospital service and consult and on with neurology. Patient will be started on 1 g of Solu-Medrol per Dr. Leonel Ramsay. Hospitalist service agreed to admission.        Okey Regal, PA-C 05/25/15 1500  Elnora Morrison, MD 05/25/15 [phone]

## 2015-05-25 NOTE — Progress Notes (Signed)
Patient trasfered from ED to 5W08 via stretcher; alert and oriented x 4; complaints of headache (5/10) - patient medicated in ED; IV saline locked in RAC; skin intact. Orient patient to room and unit; gave patient care guide; instructed how to use the call bell and  fall risk precautions. Will continue to monitor the patient.

## 2015-05-25 NOTE — Consult Note (Addendum)
Neurology Consultation Reason for Consult: weakness Referring Physician: Rosalyn Gess  CC: weakness  History is obtained from:patient  HPI: Amy Rivas is a 25 y.o. female  with a history of hemiplegic migraine who presents with right sided weakness. She had a single previous episode of right sided weakness with a headache. She has been having her current headache for the past two weeks. She states that it is associated with photophobia and visual aura that has been coming and going over the past couple of weeks. Today, she was not feeling well and therefore had her daughter call her husband. She felt lightheaded but doe snot feel that she ever passed out.   Sh esees Dr. Tomi Likens as an outpatient who recently started a prednisone taper.   LKW: 7:30 am tpa given?: no, not a stroke.     ROS: A 14 point ROS was performed and is negative except as noted in the HPI.   Past Medical History  Diagnosis Date  . GERD (gastroesophageal reflux disease)   . Anxiety   . Headache   . PSVT (paroxysmal supraventricular tachycardia) (HCC)      Family History  Problem Relation Age of Onset  . Lung cancer Maternal Grandmother   . Heart disease Mother   . Diabetes Father   . Cancer Maternal Uncle   . Diabetes Maternal Grandmother   . Asthma Maternal Grandmother   . Diabetes Maternal Grandfather   . Diabetes Paternal Grandmother   . Diabetes Paternal Grandfather   . Breast cancer Maternal Grandmother      Social History:  reports that she has never smoked. She has never used smokeless tobacco. She reports that she does not drink alcohol or use illicit drugs.   Exam: Current vital signs: There were no vitals taken for this visit. Vital signs in last 24 hours:     Physical Exam  Constitutional: Appears well-developed and well-nourished.  Psych: Affect appropriate to situation Eyes: No scleral injection HENT: No OP obstrucion Head: Normocephalic.  Cardiovascular: Normal rate and  regular rhythm.  Respiratory: Effort normal and breath sounds normal to anterior ascultation GI: Soft.  No distension. There is no tenderness.  Skin: WDI  Neuro: Mental Status: Patient is awake, alert, oriented to person, place, month, year, and situation. Patient is able to give a clear and coherent history. No signs of aphasia or neglect Cranial Nerves: II: Visual Fields are full. Pupils are equal, round, and reactive to light.   III,IV, VI: EOMI without ptosis or diploplia.  V: Facial sensation is symmetric to temperature VII: Facial movement is notable for left sided weakness, though when she puffs her cheeks, her left face remains taught.  VIII: hearing is intact to voice X: Uvula elevates symmetrically XI: Shoulder shrug is symmetric. XII: tongue is midline without atrophy or fasciculations.  Motor: Tone is normal. Bulk is normal. 5/5 strength was present on the left, she has 3/5 weakness on the right.  Sensory: Sensation is symmetric to light touch and temperature in the arms and legs. Cerebellar: FNF intact on the elft, consistent with weakness on the right.    I have reviewed labs in epic and the results pertinent to this consultation are: Chem 8, slightly high hct.   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 She will need an MRI to rule out CVA, and if it is positive, will administer IV tPA. If negative, then would treat for complicated migraine.  Recommendations: 1) MRI brain stat 2) If negative, compazine 10mg  IV, benadryl 12.5mg  IV, toradol 30mg  IV x 1.  3) Would give 1 L NS as I suspect she is slightly hemo-concentrated.    Roland Rack, MD [facility] [phone]  If 7pm- 7am, please page neurology on call as listed in [place].   MRI reveals lesion which appears more consistent with MS than stroke. She has been dizzy and having spots for at least a week prior to  onset of weakness today, I am not sure that we can say her LKW is really this morning, but feel that it is unclear, sometime last week.   It is still possible that complicated migraine is responsible for her abrupt change, but I think that she very likely has multiple sclerosis as well.   She also has a hsitory of what sounds liek optic neuritis a few months ago, with blurred vision in one eye for several days.   1) treat as complicated migraine. If symptoms resolve, can f/u with Dr. Tomi Likens as outpatient 2) If symptoms persist, then I would consider IV steroids/PT 3) MRI c-spine, MRA head.   Roland Rack, MD [facility] [phone]  If 7pm- 7am, please page neurology on call as listed in [place].   Symptoms did not improve. Would favor admission for treatment with IV steroids for presumed MS flair.   Roland Rack, MD [facility] [phone]  If 7pm- 7am, please page neurology on call as listed in [place].

## 2015-05-25 NOTE — ED Notes (Signed)
Pt states she saw her neurologist yesterday and was started on ketoprofen for her migraines. Pt endorses neck pain, and states that was occuring before her fall. The pt daughter called the pt husband when the pt was laying on the floor unresponsive.

## 2015-05-26 ENCOUNTER — Inpatient Hospital Stay (HOSPITAL_COMMUNITY): Payer: Medicaid Other

## 2015-05-26 DIAGNOSIS — G459 Transient cerebral ischemic attack, unspecified: Secondary | ICD-10-CM

## 2015-05-26 LAB — LIPID PANEL
CHOLESTEROL: 179 mg/dL (ref 0–200)
HDL: 57 mg/dL (ref >40)
LDL Cholesterol: 103 mg/dL — ABNORMAL HIGH (ref 0–99)
Total CHOL/HDL Ratio: 3.1 RATIO
Triglycerides: 93 mg/dL (ref <150)
VLDL: 19 mg/dL (ref 0–40)

## 2015-05-26 LAB — C-REACTIVE PROTEIN

## 2015-05-26 LAB — SEDIMENTATION RATE: SED RATE: 2 mm/h (ref 0–22)

## 2015-05-26 LAB — ECHOCARDIOGRAM COMPLETE
HEIGHTINCHES: 58 in
Weight: 1617.6 oz

## 2015-05-26 MED ORDER — ENOXAPARIN SODIUM 30 MG/0.3ML ~~LOC~~ SOLN
30.0000 mg | SUBCUTANEOUS | Status: DC
  Administered 2015-05-26: 30 mg via SUBCUTANEOUS
  Filled 2015-05-26: qty 0.3

## 2015-05-26 MED ORDER — ONDANSETRON HCL 4 MG/2ML IJ SOLN
4.0000 mg | INTRAMUSCULAR | Status: DC | PRN
  Administered 2015-05-26 – 2015-05-29 (×4): 4 mg via INTRAVENOUS
  Filled 2015-05-26 (×4): qty 2

## 2015-05-26 MED ORDER — PROCHLORPERAZINE EDISYLATE 5 MG/ML IJ SOLN
10.0000 mg | Freq: Once | INTRAMUSCULAR | Status: DC
  Filled 2015-05-26: qty 2

## 2015-05-26 MED ORDER — MAGNESIUM SULFATE 2 GM/50ML IV SOLN
2.0000 g | Freq: Once | INTRAVENOUS | Status: DC
  Filled 2015-05-26: qty 50

## 2015-05-26 MED ORDER — LORAZEPAM 1 MG PO TABS
1.0000 mg | ORAL_TABLET | Freq: Four times a day (QID) | ORAL | Status: DC | PRN
  Administered 2015-05-26 – 2015-05-29 (×4): 1 mg via ORAL
  Filled 2015-05-26 (×4): qty 1

## 2015-05-26 MED ORDER — KETOROLAC TROMETHAMINE 30 MG/ML IJ SOLN
30.0000 mg | Freq: Once | INTRAMUSCULAR | Status: DC
  Filled 2015-05-26: qty 1

## 2015-05-26 MED ORDER — DIPHENHYDRAMINE HCL 50 MG/ML IJ SOLN
25.0000 mg | Freq: Once | INTRAMUSCULAR | Status: DC
  Filled 2015-05-26: qty 1

## 2015-05-26 NOTE — Care Management Note (Signed)
Case Management Note  Patient Details  Name: Amy Rivas MRN: [id number] Date of Birth: 10/27/1990  Subjective/Objective:                 Patient admitted with weakness, possibly 2/2 MS per MD note, will receive IV steroid infusions daily. HH PT rec by PT eval, however not covered by Medicaid.    Action/Plan:  Anticipate DC to home tomorrow with IV steroid infusion at Short Stay on Friday. Order form on chart for Dr Tana Coast to sign, text paged by CM, but no response. Expected Discharge Date:  05/27/15               Expected Discharge Plan:  Home/Self Care  In-House Referral:  NA  Discharge planning Services  CM Consult  Post Acute Care Choice:    Choice offered to:     DME Arranged:    DME Agency:     HH Arranged:    HH Agency:     Status of Service:  In process, will continue to follow  Medicare Important Message Given:    Date Medicare IM Given:    Medicare IM give by:    Date Additional Medicare IM Given:    Additional Medicare Important Message give by:     If discussed at [place] of Stay Meetings, dates discussed:    Additional Comments:  Carles Collet, RN 05/26/2015, 3:18 PM

## 2015-05-26 NOTE — Progress Notes (Signed)
Pt refused Toradol, Magnesium, Benadryl and Compazine orders, reporting she has no migraine at this time and does not want to take any extra medications. MD aware.

## 2015-05-26 NOTE — Progress Notes (Signed)
  Echocardiogram 2D Echocardiogram has been performed.  Darlina Sicilian M 05/26/2015, 10:17 AM

## 2015-05-26 NOTE — Evaluation (Signed)
Physical Therapy Evaluation Patient Details Name: Amy Rivas MRN: [id number] DOB: [birth year] Today's Date: 05/26/2015   History of Present Illness  25 y.o. female with medical history significant for Recurrent migraines, with prior hemiplegic component, tachycardia on chronic beta blockers presented to [facility] experiencing a headache for 2 weeks that has been unresponsive to prednisone taper in outpatient setting, associated with photophobia. She developed right side weakness without numbness and left facial drooping on the morning of admission.  Clinical Impression  Pt admitted with above diagnosis. Pt currently with functional limitations due to the deficits listed below (see PT Problem List). At this time the patient has significant Rt UE and LE weakness which is impacting her ability to perform functional mobility. The pt was able to ambulate 12 feet with mod assistance and significant instability through the Rt LE.  The patient reports that when she returns home she will be alone during the day and caring for one of her children who is not in school. Based upon the patient's insurance, it is uncertain if she will have HHPT services available. Will maximize acute services prior to D/C for progression toward independence.  Anticipate D/C to home but will continue to monitor progress and modify recommendations as needed.       Follow Up Recommendations Home health PT;Supervision for mobility/OOB    Equipment Recommendations  Other (comment) (to be further assessed as pt progresses.)    Recommendations for Other Services OT consult     Precautions / Restrictions Precautions Precautions: Fall Precaution Comments: Rt side weakness Restrictions Weight Bearing Restrictions: No      Mobility  Bed Mobility Overal bed mobility: Needs Assistance Bed Mobility: Supine to Sit;Sit to Supine     Supine to sit: Min guard Sit to supine: Min guard   General bed mobility comments: min  guard for safety  Transfers Overall transfer level: Needs assistance Equipment used: 1 person hand held assist Transfers: Sit to/from Stand Sit to Stand: Mod assist         General transfer comment: Assist provided on Rt side, heavy reliance on LLE for transfer.   Ambulation/Gait Ambulation/Gait assistance: Mod assist Ambulation Distance (Feet): 12 Feet Assistive device: 1 person hand held assist Gait Pattern/deviations: Step-through pattern;Decreased weight shift to right;Decreased dorsiflexion - right;Decreased stance time - right Gait velocity: decreased   General Gait Details: Assist provided through Rt UE, pt keeping elbow in extension  due to IV placement. Pt unable to bear full weight through Rt LE, assist needed to maintain balance.   Stairs            Wheelchair Mobility    Modified Rankin (Stroke Patients Only) Modified Rankin (Stroke Patients Only) Pre-Morbid Rankin Score: No symptoms Modified Rankin: Moderately severe disability     Balance Overall balance assessment: Needs assistance Sitting-balance support: No upper extremity supported Sitting balance-Leahy Scale: Good     Standing balance support: Single extremity supported Standing balance-Leahy Scale: Poor Standing balance comment: requiring assist to maintain balance.                              Pertinent Vitals/Pain Pain Assessment: 0-10 Pain Score: 7  Pain Location: neck Pain Descriptors / Indicators: Aching Pain Intervention(s): Monitored during session    Home Living Family/patient expects to be discharged to:: Private residence Living Arrangements: Spouse/significant other;Children Available Help at Discharge: Available PRN/intermittently;Family Type of Home: House Home Access: Stairs to enter Entrance Stairs-Rails: Right;Left  Entrance Stairs-Number of Steps: 6 Home Layout: Two level Home Equipment: None Additional Comments: Pt reports that her husband works and she  will be alone with one of her children during the day. She denies having any family or friends available for support.     Prior Function Level of Independence: Independent               Hand Dominance        Extremity/Trunk Assessment   Upper Extremity Assessment: RUE deficits/detail RUE Deficits / Details: gross Rt UE weakness.         Lower Extremity Assessment: RLE deficits/detail RLE Deficits / Details: 3/5 dorsiflexion, fair quad activation.       Communication   Communication: No difficulties  Cognition Arousal/Alertness: Awake/alert Behavior During Therapy: WFL for tasks assessed/performed Overall Cognitive Status: Within Functional Limits for tasks assessed                      General Comments      Exercises General Exercises - Lower Extremity Ankle Circles/Pumps: AROM;Right;10 reps Quad Sets: Strengthening;Right;5 reps Gluteal Sets:  (instructed in technique) Heel Slides:  (instructed in technique) Other Exercises Other Exercises: dorsiflexion stretch Rt. 3 X 20 seconds.      Assessment/Plan    PT Assessment Patient needs continued PT services  PT Diagnosis Difficulty walking   PT Problem List Decreased strength;Decreased range of motion;Decreased activity tolerance;Decreased balance;Decreased mobility  PT Treatment Interventions DME instruction;Gait training;Stair training;Functional mobility training;Therapeutic activities;Therapeutic exercise;Balance training;Neuromuscular re-education;Patient/family education   PT Goals (Current goals can be found in the Care Plan section) Acute Rehab PT Goals Patient Stated Goal: get back to being independent PT Goal Formulation: With patient Time For Goal Achievement: 06/09/15 Potential to Achieve Goals: Good    Frequency Min 3X/week   Barriers to discharge Decreased caregiver support pt reports that she will be alone during the day.     Co-evaluation               End of Session  Equipment Utilized During Treatment: Gait belt Activity Tolerance: Patient tolerated treatment well Patient left: in bed;with call bell/phone within reach;with family/visitor present Nurse Communication: Mobility status         Time: [id number] PT Time Calculation (min) (ACUTE ONLY): 25 min   Charges:   PT Evaluation $PT Eval Moderate Complexity: 1 Procedure PT Treatments $Gait Training: 8-22 mins   PT G Codes:        Cassell Clement, PT, CSCS Pager [phone] Office [phone]  05/26/2015, 1:07 PM

## 2015-05-26 NOTE — Progress Notes (Signed)
Subjective: Significantly improved overnight.   Exam: Filed Vitals:   05/26/15 0604 05/26/15 1410  BP: 122/83 124/90  Pulse: 88 85  Temp: 97.7 F (36.5 C) 98.3 F (36.8 C)  Resp: 18 16   Gen: In bed, NAD Resp: non-labored breathing, no acute distress Abd: soft, nt  Neuro: MS: awake, alert, interactive and appropriate.  OH:CSPZZ, EOMI Motor: 4+/5 in the right arm and leg, 5/5 in the left Sensory:intact to LT  Pertinent Labs: Chem 8 - unremarkable  Impression: 25 yo F with right sided weakness in the setting of headache and multiple T2 lesions on MRI. The acuity is odd, and the rate of recovery is quick as well for MS, but the imaging is concerning as is the history of a separate event, a description of blurred vision concerning for optic neuritis.   I continue to suspect, however, that this likely represents MS.   Recommendations: 1) Solumedrol, given rate of improvement, likley could do 1gm daily x 3 days.  2) Will need LP looking for OC bands, cannot perform today due to prophylactic lovenox, will put on hold tomorrow. 3) ESR, CRP   Roland Rack, MD [facility] [phone]  If 7pm- 7am, please page neurology on call as listed in [place].

## 2015-05-26 NOTE — Progress Notes (Signed)
[facility]                                                                              Patient Demographics  Amy Rivas, is a 25 y.o. female, DOB - [birth year], [id number]  Admit date - 05/25/2015   Admitting Physician Waldemar Dickens, MD  Outpatient Primary MD for the patient is Osa Craver, MD  Outpatient specialists: Dr Payton Spark neurology  LOS - 1  days    Chief Complaint  Patient presents with  . Code Stroke       Brief summary   Amy Rivas is a 25 y.o. female with medical history significant for Recurrent migraines, with prior hemiplegic component, tachycardia on chronic beta blockers presented to [place] experiencing a headache for 2 weeks that has been unresponsive to prednisone taper in outpatient setting, associated with photophobia. She developed right side weakness without numbness and left facial drooping on the morning of admission. MRI of the brain in ED showed constellation of white matter lesions consistent with edema and urinating disease. Neurology was consulted and patient was started on high-dose IV steroids and admitted for further workup.   Assessment & Plan    Principal Problem:   Acute right-sided weakness/possible new dx Multiple sclerosis with exacerbation : Symptoms improving - MRI brain showed constellation of white matter lesions consistent with the mother and urinating disease, largest ill-defined left periventricular lesion.  -MRA brain showed possibility of fibromuscular dysplasia - MRI C-spine normal with no impingement or demyelination - There was a question of possible TIA, echo showed EF of 55-60% with normal wall motion - UDS negative, PT/OT/SLP evaluation - LDL 103, A1c pending -Aspirin -Patient reports some improvement after IV steroids, continue per neurology recommendations    Intractable headache -Unclear if related to known history of migraines or this is reflective of potential diagnosis  of MS with acute flare - Neurology following, overall improving    Sinus tachycardia  -Continue with preadmission beta blocker  Anxiety - Patient feeling a lot of anxiety and jitteriness, placed on Ativan  Code Status: full code  DVT Prophylaxis:  Lovenox   Family Communication: Discussed in detail with the patient, all imaging results, lab results explained to the patient and family member in the room  Disposition Plan: When cleared by neurology  Time Spent in minutes   25 minutes  Procedures:  CT head MRI brain, mra 2-D echo MRI C-spine  Consultants:   Neurology    Antimicrobials: NONE    Medications  Scheduled Meds: . aspirin  300 mg Rectal Daily   Or  . aspirin  325 mg Oral Daily  . enoxaparin (LOVENOX) injection  30 mg Subcutaneous Q24H  . methylPREDNISolone (SOLU-MEDROL) injection  1,000 mg Intravenous Daily  . metoprolol tartrate  25 mg Oral BID  . ondansetron (ZOFRAN) IV  8 mg Intravenous Once   Continuous Infusions:  PRN Meds:.LORazepam, ondansetron (ZOFRAN) IV, traMADol   Antibiotics   Anti-infectives    None        Subjective:   Amy Rivas was seen and examined today.  Feeling overall improving however very anxious, tremulous and jittery.  Per patient, her jitteriness started after IV steroids.  Patient denies dizziness, chest pain, shortness of breath, abdominal pain, N/V/D/C. No acute events overnight.  Patient reports right-sided weakness is improving.  Objective:   Filed Vitals:   05/25/15 2300 05/26/15 0042 05/26/15 0314 05/26/15 0604  BP: 139/84 129/83 119/83 122/83  Pulse: 84 74 88 88  Temp:  97.5 F (36.4 C) 97.9 F (36.6 C) 97.7 F (36.5 C)  TempSrc:   Oral   Resp: 18 18 16 18   Height:      Weight:      SpO2: 96% 96% 98% 98%    Intake/Output Summary (Last 24 hours) at 05/26/15 1054 Last data filed at 05/26/15 1041  Gross per 24 hour  Intake    120 ml  Output    350 ml  Net   -230 ml     Wt Readings from  Last 3 Encounters:  05/25/15 45.859 kg (101 lb 1.6 oz)  03/05/14 45.45 kg (100 lb 3.2 oz)  01/22/14 47.174 kg (104 lb)     Exam  General: Alert and oriented x 3, NAD  HEENT:  PERRLA, EOMI, Anicteric Sclera, mucous membranes moist.   Neck: Supple, no JVD, no masses  Cardiovascular: S1 S2 auscultated, no rubs, murmurs or gallops. Regular rate and rhythm.  Respiratory: Clear to auscultation bilaterally, no wheezing, rales or rhonchi  Gastrointestinal: Soft, nontender, nondistended, + bowel sounds  Ext: no cyanosis clubbing or edema, Tremulous  Neuro: AAOx3, Cr N's II- XII.Right-sided weakness improving   Skin: No rashes  Psych: Normal affect and demeanor, alert and oriented x3    Data Reviewed:  I have personally reviewed following labs and imaging studies  Micro Results No results found for this or any previous visit (from the past 240 hour(s)).  Radiology Reports Ct Head Wo Contrast  05/25/2015  CLINICAL DATA:  New onset right-sided weakness and left facial droop. Headache. EXAM: CT HEAD WITHOUT CONTRAST TECHNIQUE: Contiguous axial images were obtained from the base of the skull through the vertex without intravenous contrast. COMPARISON:  Head CT January 22, 2014 and brain MRI  January 24, 2014 FINDINGS: The ventricles are normal in size and configuration. There is no intracranial mass, hemorrhage, extra-axial fluid collection, or midline shift. Gray-white compartments appear normal. No acute infarct evident. The middle cerebral artery attenuation is normal bilaterally. Bony calvarium appears intact. The mastoid air cells are clear. No intraorbital lesions are evident. IMPRESSION: Study within normal limits. Critical Value/emergent results were called by telephone at the time of interpretation on 05/25/2015 at 9:51 am to Dr. Roland Rack, who verbally acknowledged these results. Electronically Signed   By: Lowella Grip III M.D.   On: 05/25/2015 09:52   Mr Jodene Nam Head Wo  Contrast  05/26/2015  CLINICAL DATA:  Right-sided weakness. EXAM: MRA HEAD WITHOUT CONTRAST TECHNIQUE: Angiographic images of the Circle of Willis were obtained using MRA technique without intravenous contrast. COMPARISON:  Brain MRI from yesterday FINDINGS: Symmetric carotid arteries. Right vertebral artery dominance. Fetal type PCA circulation. Dominant right PICA and left AICA. The bilateral distal cervical ICA, at the skullbase, have a mildly beaded appearance. The upper margin of the left M1 segment also has a lobulated appearance, most distally related to a lenticulostriate infundibulum. No diffuse beading of vessels as permitted by technical factors. Negative for aneurysm. No major branch occlusion. IMPRESSION: 1. Artifactual or mild beading of the distal cervical ICA raise the possibility of fibromuscular dysplasia. 2. Lobulated appearance of the left M1 segment  of indeterminate significance in isolation. No diffuse beading typical of vasculitis or RCVS. Consider correlation with CSF. Electronically Signed   By: Monte Fantasia M.D.   On: 05/26/2015 10:51   Mr Jeri Cos [id number] Contrast  05/25/2015  CLINICAL DATA:  RIGHT-sided weakness. LEFT facial droop upon waking. Headache for 2 weeks. Neck pain. EXAM: MRI HEAD WITHOUT AND WITH CONTRAST TECHNIQUE: Multiplanar, multiecho pulse sequences of the brain and surrounding structures were obtained without and with intravenous contrast. CONTRAST:  49mL MULTIHANCE GADOBENATE DIMEGLUMINE 529 MG/ML IV SOLN COMPARISON:  CT head earlier today.  MR head 01/24/2014. FINDINGS: Single focus of mildly restricted +/- facilitated diffusion, LEFT centrum semiovale, corresponding to a 1 cm area of dominant ill-defined periventricular white matter signal abnormality on T2 and FLAIR imaging. Smaller periventricular greater than subcortical supratentorial lesions elsewhere on T2 and FLAIR, inapparent on diffusion imaging. No postcontrast enhancement at any location. No involvement of  the cerebellum or brainstem. No cortical lesions. No evidence for acute stroke or hemorrhage. No extra-axial fluid collection. Normal cerebral volume upper cervical region unremarkable. No tonsillar herniation. Pituitary. Extracranial soft tissues unremarkable. Post infusion imaging through the entire brain demonstrates no abnormal enhancement. Compared with 2016, white matter lesions were not present. IMPRESSION: Constellation of white matter lesions most consistent with demyelinating disease. Periventricular predominance, and interval development since January 2016 suggest a subacute to chronic time course. Equivocal diffusion restriction on today's imaging, in conjunction with RIGHT-sided weakness, suggesting that the largest, ill-defined, LEFT periventricular lesion may be more acute. No associated postcontrast enhancement however is demonstrated. Electronically Signed   By: Staci Righter M.D.   On: 05/25/2015 11:58   Mr Cervical Spine Wo Contrast  05/26/2015  CLINICAL DATA:  Right-sided weakness. Possible demyelinating disease on brain MRI. EXAM: MRI CERVICAL SPINE WITHOUT CONTRAST TECHNIQUE: Multiplanar, multisequence MR imaging of the cervical spine was performed. No intravenous contrast was administered. COMPARISON:  None. FINDINGS: Normal cord signal and morphology. No evidence of cord demyelination. Subtle T2 and STIR hyperintensity in the medulla is favored artifactual. No abnormality seen in this region on previous brain MRI. There is no degenerative change or stenosis. Normal flow related signal loss in the visualized vertebral and carotid arteries. IMPRESSION: Normal cervical spine.  No impingement or evidence of demyelination. Electronically Signed   By: Monte Fantasia M.D.   On: 05/26/2015 10:08    Lab Data:  CBC:  Recent Labs Lab 05/25/15 0937 05/25/15 0942  WBC 8.8  --   NEUTROABS 5.3  --   HGB 16.2* 17.3*  HCT 46.6* 51.0*  MCV 91.2  --   PLT 270  --    Basic Metabolic  Panel:  Recent Labs Lab 05/25/15 0937 05/25/15 0942  NA 140 140  K 4.2 4.3  CL 107 105  CO2 23  --   GLUCOSE 79 77  BUN 10 12  CREATININE 0.78 0.70  CALCIUM 9.4  --    GFR: Estimated Creatinine Clearance: 68.8 mL/min (by C-G formula based on Cr of 0.7). Liver Function Tests:  Recent Labs Lab 05/25/15 0937  AST 26  ALT 18  ALKPHOS 39  BILITOT 1.0  PROT 6.9  ALBUMIN 4.3   No results for input(s): LIPASE, AMYLASE in the last 168 hours. No results for input(s): AMMONIA in the last 168 hours. Coagulation Profile:  Recent Labs Lab 05/25/15 0937  INR 1.10   Cardiac Enzymes: No results for input(s): CKTOTAL, CKMB, CKMBINDEX, TROPONINI in the last 168 hours. BNP (last 3 results) No  results for input(s): PROBNP in the last 8760 hours. HbA1C: No results for input(s): HGBA1C in the last 72 hours. CBG: No results for input(s): GLUCAP in the last 168 hours. Lipid Profile:  Recent Labs  05/26/15 0539  CHOL 179  HDL 57  LDLCALC 103*  TRIG 93  CHOLHDL 3.1   Thyroid Function Tests: No results for input(s): TSH, T4TOTAL, FREET4, T3FREE, THYROIDAB in the last 72 hours. Anemia Panel: No results for input(s): VITAMINB12, FOLATE, FERRITIN, TIBC, IRON, RETICCTPCT in the last 72 hours. Urine analysis:    Component Value Date/Time   COLORURINE YELLOW 05/25/2015 1150   APPEARANCEUR CLEAR 05/25/2015 1150   LABSPEC 1.011 05/25/2015 1150   PHURINE 7.0 05/25/2015 1150   GLUCOSEU NEGATIVE 05/25/2015 1150   HGBUR NEGATIVE 05/25/2015 [place] 05/25/2015 1150   KETONESUR NEGATIVE 05/25/2015 1150   PROTEINUR NEGATIVE 05/25/2015 1150   UROBILINOGEN 0.2 01/22/2014 1606   NITRITE NEGATIVE 05/25/2015 1150   LEUKOCYTESUR NEGATIVE 05/25/2015 [place] M.D. [facility] 05/26/2015, 10:54 AM  Pager: [phone] Between 7am to 7pm - call Pager - [phone]  After 7pm go to [email or web address] - password [email or web address]  Call night coverage person covering  after 7pm

## 2015-05-26 NOTE — Progress Notes (Signed)
Pt running in tachy, HR >140. Pt assessed and was in pain. PRN pain management given and was effective.

## 2015-05-27 DIAGNOSIS — M6289 Other specified disorders of muscle: Secondary | ICD-10-CM

## 2015-05-27 LAB — CSF CELL COUNT WITH DIFFERENTIAL
RBC Count, CSF: 3 /mm3 — ABNORMAL HIGH
Tube #: 1
WBC CSF: 5 /mm3 (ref 0–5)

## 2015-05-27 LAB — BASIC METABOLIC PANEL
ANION GAP: 12 (ref 5–15)
BUN: 16 mg/dL (ref 6–20)
CALCIUM: 9.6 mg/dL (ref 8.9–10.3)
CO2: 24 mmol/L (ref 22–32)
Chloride: 101 mmol/L (ref 101–111)
Creatinine, Ser: 0.65 mg/dL (ref 0.44–1.00)
GFR calc non Af Amer: >60 mL/min (ref >60)
GLUCOSE: 115 mg/dL — AB (ref 65–99)
Potassium: 4.6 mmol/L (ref 3.5–5.1)
Sodium: 137 mmol/L (ref 135–145)

## 2015-05-27 LAB — HEMOGLOBIN A1C
Hgb A1c MFr Bld: 4.8 % (ref 4.8–5.6)
MEAN PLASMA GLUCOSE: 91 mg/dL

## 2015-05-27 LAB — PROTEIN AND GLUCOSE, CSF
GLUCOSE CSF: 76 mg/dL — AB (ref 40–70)
Total  Protein, CSF: 28 mg/dL (ref 15–45)

## 2015-05-27 LAB — CBC
HEMATOCRIT: 41.5 % (ref 36.0–46.0)
Hemoglobin: 14.2 g/dL (ref 12.0–15.0)
MCH: 31 pg (ref 26.0–34.0)
MCHC: 34.2 g/dL (ref 30.0–36.0)
MCV: 90.6 fL (ref 78.0–100.0)
PLATELETS: 271 10*3/uL (ref 150–400)
RBC: 4.58 MIL/uL (ref 3.87–5.11)
RDW: 12.3 % (ref 11.5–15.5)
WBC: 16.2 10*3/uL — AB (ref 4.0–10.5)

## 2015-05-27 MED ORDER — ENOXAPARIN SODIUM 30 MG/0.3ML ~~LOC~~ SOLN: 30.0000 mg | SUBCUTANEOUS | Status: DC

## 2015-05-27 NOTE — Progress Notes (Signed)
Physical Therapy Treatment Patient Details Name: Amy Rivas MRN: [id number] DOB: [birth year] Today's Date: 05/27/2015    History of Present Illness 25 y.o. female with medical history significant for Recurrent migraines, with prior hemiplegic component, tachycardia on chronic beta blockers presented to [place] experiencing a headache for 2 weeks that has been unresponsive to prednisone taper in outpatient setting, associated with photophobia. She developed right side weakness without numbness and left facial drooping on the morning of admission.    PT Comments    Patient demonstrated improvements in R side strength but continues to be very unsteady with OOB mobility and required min/mod A for safe ambulation. Practiced use of RW and SPC. Patient needs to practice stairs next session.   Continue to maximize acute services prior to d/c as pt will not have assistance at home after Sunday.   Follow Up Recommendations  Home health PT;Supervision for mobility/OOB     Equipment Recommendations  Other (comment) (to be further assessed as pt progresses.)    Recommendations for Other Services OT consult     Precautions / Restrictions Precautions Precautions: Fall Precaution Comments: Rt side weakness Restrictions Weight Bearing Restrictions: No    Mobility  Bed Mobility Overal bed mobility: Needs Assistance Bed Mobility: Supine to Sit;Sit to Supine     Supine to sit: Supervision Sit to supine: Supervision   General bed mobility comments: supervision for safety; increased time needed; HOB elevated  Transfers Overall transfer level: Needs assistance Equipment used: 1 person hand held assist;Rolling walker (2 wheeled);Straight cane Transfers: Sit to/from Stand Sit to Stand: Min guard;Min assist         General transfer comment: cues for using R UE to push up as well as L UE; min A first trial to gain balance upon stand from EOB; educated in use of SPC; very unsteady with use  of SPC but with safe use of AD; needs to practice stairs before d/c  Ambulation/Gait Ambulation/Gait assistance: Mod assist;Min assist Ambulation Distance (Feet): 110 Feet (15,75,20) Assistive device: 1 person hand held assist;Rolling walker (2 wheeled); SPC Gait Pattern/deviations: Step-through pattern;Decreased dorsiflexion - right;Decreased weight shift to right Gait velocity: decreased   General Gait Details: pt with improved strength in R side this session but continues to need assist for balance; pt educated in use of RW; HHA 70ft with min/mod A and 77ft with RW with no LOB; cues for safe use of DME, step length, and R heel strike; pt also educated in use of [facility] for 61ft with min/mod A needed to maintain balance   Stairs            Wheelchair Mobility    Modified Rankin (Stroke Patients Only) Modified Rankin (Stroke Patients Only) Pre-Morbid Rankin Score: No symptoms Modified Rankin: Moderately severe disability     Balance Overall balance assessment: Needs assistance Sitting-balance support: No upper extremity supported;Feet supported Sitting balance-Leahy Scale: Good     Standing balance support: Single extremity supported Standing balance-Leahy Scale: Poor                      Cognition Arousal/Alertness: Awake/alert Behavior During Therapy: WFL for tasks assessed/performed Overall Cognitive Status: Within Functional Limits for tasks assessed                      Exercises      General Comments        Pertinent Vitals/Pain Pain Assessment: No/denies pain    Home Living  Prior Function            PT Goals (current goals can now be found in the care plan section) Acute Rehab PT Goals Patient Stated Goal: get back to being independent PT Goal Formulation: With patient Time For Goal Achievement: 06/09/15 Potential to Achieve Goals: Good Progress towards PT goals: Progressing toward goals     Frequency  Min 3X/week    PT Plan Current plan remains appropriate    Co-evaluation             End of Session Equipment Utilized During Treatment: Gait belt Activity Tolerance: Patient tolerated treatment well Patient left: in bed;with call bell/phone within reach;with family/visitor present     Time: [id number] PT Time Calculation (min) (ACUTE ONLY): 45 min  Charges:  $Gait Training: 23-37 mins $Therapeutic Activity: 8-22 mins                    G Codes:      [place] April, PTA Pager: [phone]   05/27/2015, 9:43 AM

## 2015-05-27 NOTE — Evaluation (Signed)
Occupational Therapy Evaluation Patient Details Name: Amy Rivas MRN: [id number] DOB: 30-Apr-1990 Today's Date: 05/27/2015    History of Present Illness 25 y.o. female with medical history significant for Recurrent migraines, with prior hemiplegic component, tachycardia on chronic beta blockers presented to [place] experiencing a headache for 2 weeks that has been unresponsive to prednisone taper in outpatient setting, associated with photophobia. She developed right side weakness without numbness and left facial drooping on the morning of admission.   Clinical Impression   Pt was independent prior to admission. Presents with R side weakness, impaired standing balance, photophobia and intermittent blurred vision. She requires min guard assist for OOB mobility and standing ADL. She is not yet using her R UE as a lead, but reports improvement in function. Will follow acutely.    Follow Up Recommendations  Home health OT (progressing to OPOT)    Equipment Recommendations  Tub/shower seat (Has Medicaid coverage)   Recommendations for Other Services       Precautions / Restrictions Precautions Precautions: Fall Precaution Comments: Rt side weakness Restrictions Weight Bearing Restrictions: No      Mobility Bed Mobility Overal bed mobility: Needs Assistance Bed Mobility: Supine to Sit     Supine to sit: Supervision Sit to supine: Supervision   General bed mobility comments: no physical assist, supervision for safety and lines  Transfers Overall transfer level: Needs assistance Equipment used: 1 person hand held assist;Rolling walker (2 wheeled);Straight cane Transfers: Sit to/from Stand Sit to Stand: Min guard         General transfer comment: stood without assist from bed and toilet, pushed IV pole    Balance Overall balance assessment: Needs assistance Sitting-balance support: No upper extremity supported;Feet supported Sitting balance-Leahy Scale: Good      Standing balance support: Single extremity supported Standing balance-Leahy Scale: Poor                              ADL Overall ADL's : Needs assistance/impaired Eating/Feeding: Set up;Sitting Eating/Feeding Details (indicate cue type and reason): using L hand as lead Grooming: Wash/dry hands;Standing;Min guard   Upper Body Bathing: Set up;Sitting   Lower Body Bathing: Min guard;Sit to/from stand   Upper Body Dressing : Set up;Sitting   Lower Body Dressing: Min guard;Sit to/from stand Lower Body Dressing Details (indicate cue type and reason): able to don and doff socks Toilet Transfer: Min guard;Ambulation (pushed IV pole)   Toileting- Clothing Manipulation and Hygiene: Min guard;Sit to/from Nurse, children's Details (indicate cue type and reason): educated pt in benefits of shower seat Functional mobility during ADLs: Min guard (pushing IV pole) General ADL Comments: Instructed pt to use R UE as much as possible in ADL.     Vision     Perception     Praxis      Pertinent Vitals/Pain Pain Assessment: No/denies pain     Hand Dominance Right   Extremity/Trunk Assessment Upper Extremity Assessment Upper Extremity Assessment: RUE deficits/detail RUE Deficits / Details: 4-/5 RUE Coordination: decreased fine motor;decreased gross motor   Lower Extremity Assessment Lower Extremity Assessment: Defer to PT evaluation   Cervical / Trunk Assessment Cervical / Trunk Assessment: Normal   Communication Communication Communication: No difficulties   Cognition Arousal/Alertness: Awake/alert Behavior During Therapy: WFL for tasks assessed/performed Overall Cognitive Status: Within Functional Limits for tasks assessed  General Comments       Exercises       Shoulder Instructions      Home Living Family/patient expects to be discharged to:: Private residence Living Arrangements: Spouse/significant  other;Children (26 year old and 79 year old) Available Help at Discharge: Available PRN/intermittently;Family Type of Home: House Home Access: Stairs to enter [employer] of Steps: 6 Entrance Stairs-Rails: Right;Left Home Layout: Two level Alternate Level Stairs-Number of Steps: 6 Alternate Level Stairs-Rails: Right;Left Bathroom Shower/Tub: Teacher, early years/pre: Standard     Home Equipment: None   Additional Comments: Pt reports that her husband works and she will be alone with one of her children during the day. She denies having any family or friends available for support.       Prior Functioning/Environment Level of Independence: Independent             OT Diagnosis: Generalized weakness;Hemiplegia dominant side   OT Problem List: Decreased strength;Impaired balance (sitting and/or standing);Decreased coordination;Decreased knowledge of use of DME or AE;Impaired UE functional use   OT Treatment/Interventions: Self-care/ADL training;Therapeutic exercise;DME and/or AE instruction;Therapeutic activities;Patient/family education;Balance training    OT Goals(Current goals can be found in the care plan section) Acute Rehab OT Goals Patient Stated Goal: get back to being independent OT Goal Formulation: With patient Time For Goal Achievement: 06/03/15 Potential to Achieve Goals: Good ADL Goals Pt Will Perform Eating: Independently;sitting (with R lead, including opening containers) Pt Will Perform Grooming: with modified independence;standing Pt Will Perform Lower Body Bathing: with modified independence;sit to/from stand Pt Will Perform Lower Body Dressing: with modified independence;sit to/from stand Pt Will Transfer to Toilet: with modified independence;ambulating;regular height toilet Pt Will Perform Toileting - Clothing Manipulation and hygiene: with modified independence;sit to/from stand Pt Will Perform Tub/Shower Transfer: Tub transfer;with min  guard assist;ambulating;shower seat Pt/caregiver will Perform Home Exercise Program: Increased strength;Right Upper extremity;With theraputty;With theraband;Independently  OT Frequency: Min 3X/week   Barriers to D/C:            Co-evaluation              End of Session Equipment Utilized During Treatment: Gait belt  Activity Tolerance: Patient tolerated treatment well Patient left: in chair;with call bell/phone within reach   Time: 1158-1221 OT Time Calculation (min): 23 min Charges:  OT General Charges $OT Visit: 1 Procedure OT Evaluation $OT Eval Moderate Complexity: 1 Procedure OT Treatments $Self Care/Home Management : 8-22 mins G-Codes:    Malka So 05/27/2015, 12:56 PM  [phone]

## 2015-05-27 NOTE — Progress Notes (Addendum)
Subjective: Headache is gone  Exam: Filed Vitals:   05/27/15 0128 05/27/15 0504  BP: 113/93 116/74  Pulse: 82 90  Temp: 97.5 F (36.4 C) 98.1 F (36.7 C)  Resp: 18 18   Gen: In bed, NAD Resp: non-labored breathing, no acute distress Abd: soft, nt  Neuro: MS: awake, alert, interactive and appropriate.  [id number], EOMI Motor: 4+/5 in the right leg, 5/5 in the left Sensory:intact to LT  Pertinent Labs: Chem 8 - unremarkable  Impression: 25 yo F with right sided weakness in the setting of headache and multiple T2 lesions on MRI. The acuity is odd, and the rate of recovery is quick as well for MS, but the imaging is concerning as is the history of a separate event of weakness, and a description of blurred vision concerning for optic neuritis.   I continue to suspect, however, that this likely represents MS.   Recommendations: 1) Solumedrol, given rate of improvement, She is still symptomatic, may consider extending to 5 days.  2) Will need LP looking for OC bands, will perform this afternoon.  3) will follow.   Roland Rack, MD [facility] [phone]  If 7pm- 7am, please page neurology on call as listed in [place].

## 2015-05-27 NOTE — Progress Notes (Signed)
[facility]                                                                              Patient Demographics  Amy Rivas, is a 25 y.o. female, DOB - 1990/11/04, [id number]  Admit date - 05/25/2015   Admitting Physician Waldemar Dickens, MD  Outpatient Primary MD for the patient is Osa Craver, MD  Outpatient specialists: Dr Payton Spark neurology  LOS - 2  days    Chief Complaint  Patient presents with  . Code Stroke       Brief summary   Amy Rivas is a 25 y.o. female with medical history significant for Recurrent migraines, with prior hemiplegic component, tachycardia on chronic beta blockers presented to [place] experiencing a headache for 2 weeks that has been unresponsive to prednisone taper in outpatient setting, associated with photophobia. She developed right side weakness without numbness and left facial drooping on the morning of admission. MRI of the brain in ED showed constellation of white matter lesions consistent with edema and urinating disease. Neurology was consulted and patient was started on high-dose IV steroids and admitted for further workup.   Assessment & Plan    Principal Problem:   Acute right-sided weakness/possible new dx Multiple sclerosis with exacerbation : Symptoms improving - MRI brain showed constellation of white matter lesions consistent with the mother and urinating disease, largest ill-defined left periventricular lesion.  -MRA brain showed possibility of fibromuscular dysplasia - MRI C-spine normal with no impingement or demyelination - There was a question of possible TIA, echo showed EF of 55-60% with normal wall motion - UDS negative, PT/OT/SLP evaluation - LDL 103, A1c 4.8 -Aspirin -Patient reports improvement after IV steroids, continue per neurology recommendations, LP today.     Intractable headache-resolved -Unclear if related to known history of migraines or this is reflective of  potential diagnosis of MS with acute flare - Neurology following, overall improving    Sinus tachycardia  -Continue with preadmission beta blocker  Anxiety - Improved significantly with Ativan  Code Status: full code  DVT Prophylaxis:  Lovenox   Family Communication: Discussed in detail with the patient, all imaging results, lab results explained to the patient and husband in the room  Disposition Plan: When cleared by neurology   Time Spent in minutes   25 minutes  Procedures:  CT head MRI brain, mra 2-D echo MRI C-spine  Consultants:   Neurology    Antimicrobials: NONE    Medications  Scheduled Meds: . aspirin  300 mg Rectal Daily   Or  . aspirin  325 mg Oral Daily  . diphenhydrAMINE  25 mg Intravenous Once  . ketorolac  30 mg Intravenous Once  . magnesium sulfate 1 - 4 g bolus IVPB  2 g Intravenous Once  . methylPREDNISolone (SOLU-MEDROL) injection  1,000 mg Intravenous Daily  . metoprolol tartrate  25 mg Oral BID  . ondansetron (ZOFRAN) IV  8 mg Intravenous Once  . prochlorperazine  10 mg Intravenous Once   Continuous Infusions:  PRN Meds:.LORazepam, ondansetron (ZOFRAN) IV, traMADol   Antibiotics   Anti-infectives    None  Subjective:   Amy Rivas was seen and examined today.  Feeling a lot better today however still has right lower extremity weakness. Headache is improved. Her anxiety and jitteriness is also improved.  Patient denied dizziness, chest pain, shortness of breath, abdominal pain, N/V/D/C. No acute events overnight.    Objective:   Filed Vitals:   05/26/15 1410 05/26/15 2014 05/27/15 0128 05/27/15 0504  BP: 124/90 120/85 113/93 116/74  Pulse: 85 93 82 90  Temp: 98.3 F (36.8 C) 97.8 F (36.6 C) 97.5 F (36.4 C) 98.1 F (36.7 C)  TempSrc: Oral Oral Oral Oral  Resp: 16 16 18 18   Height:      Weight:      SpO2: 98% 97% 97% 94%   No intake or output data in the 24 hours ending 05/27/15 1231   Wt Readings  from Last 3 Encounters:  05/25/15 45.859 kg (101 lb 1.6 oz)  03/05/14 45.45 kg (100 lb 3.2 oz)  01/22/14 47.174 kg (104 lb)     Exam  General: Alert and oriented x 3, NAD  HEENT:    Neck:   Cardiovascular: S1 S2 auscultated, no rubs, murmurs or gallops. Regular rate and rhythm.  Respiratory: Clear to auscultation bilaterally, no wheezing, rales or rhonchi  Gastrointestinal: Soft, nontender, nondistended, + bowel sounds  Ext: no cyanosis clubbing or edema  Neuro: AAOx3, Cr N's II- XII. Right-sided weakness improving but lower extremity still weak  Skin: No rashes  Psych: Normal affect and demeanor, alert and oriented x3    Data Reviewed:  I have personally reviewed following labs and imaging studies  Micro Results No results found for this or any previous visit (from the past 240 hour(s)).  Radiology Reports Ct Head Wo Contrast  05/25/2015  CLINICAL DATA:  New onset right-sided weakness and left facial droop. Headache. EXAM: CT HEAD WITHOUT CONTRAST TECHNIQUE: Contiguous axial images were obtained from the base of the skull through the vertex without intravenous contrast. COMPARISON:  Head CT January 22, 2014 and brain MRI  January 24, 2014 FINDINGS: The ventricles are normal in size and configuration. There is no intracranial mass, hemorrhage, extra-axial fluid collection, or midline shift. Gray-white compartments appear normal. No acute infarct evident. The middle cerebral artery attenuation is normal bilaterally. Bony calvarium appears intact. The mastoid air cells are clear. No intraorbital lesions are evident. IMPRESSION: Study within normal limits. Critical Value/emergent results were called by telephone at the time of interpretation on 05/25/2015 at 9:51 am to Dr. Roland Rack, who verbally acknowledged these results. Electronically Signed   By: Lowella Grip III M.D.   On: 05/25/2015 09:52   Mr Jodene Nam Head Wo Contrast  05/26/2015  CLINICAL DATA:  Right-sided  weakness. EXAM: MRA HEAD WITHOUT CONTRAST TECHNIQUE: Angiographic images of the Circle of Willis were obtained using MRA technique without intravenous contrast. COMPARISON:  Brain MRI from yesterday FINDINGS: Symmetric carotid arteries. Right vertebral artery dominance. Fetal type PCA circulation. Dominant right PICA and left AICA. The bilateral distal cervical ICA, at the skullbase, have a mildly beaded appearance. The upper margin of the left M1 segment also has a lobulated appearance, most distally related to a lenticulostriate infundibulum. No diffuse beading of vessels as permitted by technical factors. Negative for aneurysm. No major branch occlusion. IMPRESSION: 1. Artifactual or mild beading of the distal cervical ICA raise the possibility of fibromuscular dysplasia. 2. Lobulated appearance of the left M1 segment of indeterminate significance in isolation. No diffuse beading typical of vasculitis or RCVS. Consider  correlation with CSF. Electronically Signed   By: Monte Fantasia M.D.   On: 05/26/2015 10:51   Mr Jeri Cos [id number] Contrast  05/25/2015  CLINICAL DATA:  RIGHT-sided weakness. LEFT facial droop upon waking. Headache for 2 weeks. Neck pain. EXAM: MRI HEAD WITHOUT AND WITH CONTRAST TECHNIQUE: Multiplanar, multiecho pulse sequences of the brain and surrounding structures were obtained without and with intravenous contrast. CONTRAST:  14mL MULTIHANCE GADOBENATE DIMEGLUMINE 529 MG/ML IV SOLN COMPARISON:  CT head earlier today.  MR head 01/24/2014. FINDINGS: Single focus of mildly restricted +/- facilitated diffusion, LEFT centrum semiovale, corresponding to a 1 cm area of dominant ill-defined periventricular white matter signal abnormality on T2 and FLAIR imaging. Smaller periventricular greater than subcortical supratentorial lesions elsewhere on T2 and FLAIR, inapparent on diffusion imaging. No postcontrast enhancement at any location. No involvement of the cerebellum or brainstem. No cortical lesions. No  evidence for acute stroke or hemorrhage. No extra-axial fluid collection. Normal cerebral volume upper cervical region unremarkable. No tonsillar herniation. Pituitary. Extracranial soft tissues unremarkable. Post infusion imaging through the entire brain demonstrates no abnormal enhancement. Compared with 2016, white matter lesions were not present. IMPRESSION: Constellation of white matter lesions most consistent with demyelinating disease. Periventricular predominance, and interval development since January 2016 suggest a subacute to chronic time course. Equivocal diffusion restriction on today's imaging, in conjunction with RIGHT-sided weakness, suggesting that the largest, ill-defined, LEFT periventricular lesion may be more acute. No associated postcontrast enhancement however is demonstrated. Electronically Signed   By: Staci Righter M.D.   On: 05/25/2015 11:58   Mr Cervical Spine Wo Contrast  05/26/2015  CLINICAL DATA:  Right-sided weakness. Possible demyelinating disease on brain MRI. EXAM: MRI CERVICAL SPINE WITHOUT CONTRAST TECHNIQUE: Multiplanar, multisequence MR imaging of the cervical spine was performed. No intravenous contrast was administered. COMPARISON:  None. FINDINGS: Normal cord signal and morphology. No evidence of cord demyelination. Subtle T2 and STIR hyperintensity in the medulla is favored artifactual. No abnormality seen in this region on previous brain MRI. There is no degenerative change or stenosis. Normal flow related signal loss in the visualized vertebral and carotid arteries. IMPRESSION: Normal cervical spine.  No impingement or evidence of demyelination. Electronically Signed   By: Monte Fantasia M.D.   On: 05/26/2015 10:08    Lab Data:  CBC:  Recent Labs Lab 05/25/15 0937 05/25/15 0942 05/27/15 0807  WBC 8.8  --  16.2*  NEUTROABS 5.3  --   --   HGB 16.2* 17.3* 14.2  HCT 46.6* 51.0* 41.5  MCV 91.2  --  90.6  PLT 270  --  99991111   Basic Metabolic  Panel:  Recent Labs Lab 05/25/15 0937 05/25/15 0942 05/27/15 0807  NA 140 140 137  K 4.2 4.3 4.6  CL 107 105 101  CO2 23  --  24  GLUCOSE 79 77 115*  BUN 10 12 16   CREATININE 0.78 0.70 0.65  CALCIUM 9.4  --  9.6   GFR: Estimated Creatinine Clearance: 68.8 mL/min (by C-G formula based on Cr of 0.65). Liver Function Tests:  Recent Labs Lab 05/25/15 0937  AST 26  ALT 18  ALKPHOS 39  BILITOT 1.0  PROT 6.9  ALBUMIN 4.3   No results for input(s): LIPASE, AMYLASE in the last 168 hours. No results for input(s): AMMONIA in the last 168 hours. Coagulation Profile:  Recent Labs Lab 05/25/15 0937  INR 1.10   Cardiac Enzymes: No results for input(s): CKTOTAL, CKMB, CKMBINDEX, TROPONINI in the last 168  hours. BNP (last 3 results) No results for input(s): PROBNP in the last 8760 hours. HbA1C:  Recent Labs  05/26/15 0539  HGBA1C 4.8   CBG: No results for input(s): GLUCAP in the last 168 hours. Lipid Profile:  Recent Labs  05/26/15 0539  CHOL 179  HDL 57  LDLCALC 103*  TRIG 93  CHOLHDL 3.1   Thyroid Function Tests: No results for input(s): TSH, T4TOTAL, FREET4, T3FREE, THYROIDAB in the last 72 hours. Anemia Panel: No results for input(s): VITAMINB12, FOLATE, FERRITIN, TIBC, IRON, RETICCTPCT in the last 72 hours. Urine analysis:    Component Value Date/Time   COLORURINE YELLOW 05/25/2015 1150   APPEARANCEUR CLEAR 05/25/2015 1150   LABSPEC 1.011 05/25/2015 1150   PHURINE 7.0 05/25/2015 1150   GLUCOSEU NEGATIVE 05/25/2015 1150   HGBUR NEGATIVE 05/25/2015 [place] 05/25/2015 1150   KETONESUR NEGATIVE 05/25/2015 1150   PROTEINUR NEGATIVE 05/25/2015 1150   UROBILINOGEN 0.2 01/22/2014 1606   NITRITE NEGATIVE 05/25/2015 1150   LEUKOCYTESUR NEGATIVE 05/25/2015 [place] M.D. [facility] 05/27/2015, 12:31 PM  Pager: [phone] Between 7am to 7pm - call Pager - [phone]  After 7pm go to [email or web address] - password  [email or web address]  Call night coverage person covering after 7pm

## 2015-05-27 NOTE — Procedures (Signed)
Indication: Concern for MS  Risks of the procedure were dicussed with the patient including post-LP headache, bleeding, infection, weakness/numbness of legs(radiculopathy), death.  The patient/patient's proxy agreed and written consent was obtained.   The patient was prepped and draped, and using sterile technique a 20 gauge quinke spinal needle was inserted in the L5-S1 space. Initially bony resistance was met and therfore the needle was withdrawn and repositioned. On the second pass, clear CSF return was obtained. The opening pressure was 7 cm H2O. Approximately 9 cc of CSF were obtained and sent for analysis.    Roland Rack, MD [facility] [phone]  If 7pm- 7am, please page neurology on call as listed in [place].

## 2015-05-27 NOTE — Progress Notes (Signed)
SLP Cancellation Note  Patient Details Name: SHAMORA TAUNTON MRN: [id number] DOB: May 24, 1990   Cancelled treatment:       Reason Eval/Treat Not Completed: SLP screened, no needs identified, will sign off   Kalif Kattner, Katherene Ponto 05/27/2015, 10:53 AM

## 2015-05-28 ENCOUNTER — Telehealth: Payer: Self-pay | Admitting: Neurology

## 2015-05-28 LAB — BASIC METABOLIC PANEL
ANION GAP: 10 (ref 5–15)
BUN: 16 mg/dL (ref 6–20)
CALCIUM: 9.6 mg/dL (ref 8.9–10.3)
CO2: 26 mmol/L (ref 22–32)
Chloride: 101 mmol/L (ref 101–111)
Creatinine, Ser: 0.73 mg/dL (ref 0.44–1.00)
GFR calc non Af Amer: >60 mL/min (ref >60)
Glucose, Bld: 117 mg/dL — ABNORMAL HIGH (ref 65–99)
Potassium: 4.7 mmol/L (ref 3.5–5.1)
SODIUM: 137 mmol/L (ref 135–145)

## 2015-05-28 LAB — CBC
HEMATOCRIT: 43.6 % (ref 36.0–46.0)
HEMOGLOBIN: 15.2 g/dL — AB (ref 12.0–15.0)
MCH: 31.6 pg (ref 26.0–34.0)
MCHC: 34.9 g/dL (ref 30.0–36.0)
MCV: 90.6 fL (ref 78.0–100.0)
Platelets: 299 10*3/uL (ref 150–400)
RBC: 4.81 MIL/uL (ref 3.87–5.11)
RDW: 12.4 % (ref 11.5–15.5)
WBC: 16.8 10*3/uL — AB (ref 4.0–10.5)

## 2015-05-28 LAB — CSF IGG: IGG CSF: 3.3 mg/dL (ref 0.0–8.6)

## 2015-05-28 MED ORDER — SODIUM CHLORIDE 0.9 % IV SOLN
1000.0000 mg | Freq: Every day | INTRAVENOUS | Status: AC
  Administered 2015-05-29: 1000 mg via INTRAVENOUS
  Filled 2015-05-28: qty 8

## 2015-05-28 MED ORDER — SENNOSIDES-DOCUSATE SODIUM 8.6-50 MG PO TABS
1.0000 | ORAL_TABLET | Freq: Every day | ORAL | Status: DC
  Administered 2015-05-28: 1 via ORAL
  Filled 2015-05-28: qty 1

## 2015-05-28 MED ORDER — POLYETHYLENE GLYCOL 3350 17 G PO PACK
17.0000 g | PACK | Freq: Once | ORAL | Status: AC
Start: 2015-05-28 — End: 2015-05-28
  Administered 2015-05-28: 17 g via ORAL
  Filled 2015-05-28: qty 1

## 2015-05-28 NOTE — Telephone Encounter (Signed)
I did speak with Amy Rivas and we did schedule her for 5/22 at 1:00. Thanks

## 2015-05-28 NOTE — Progress Notes (Signed)
Physical Therapy Treatment Patient Details Name: Amy Rivas MRN: [id number] DOB: 01/30/1990 Today's Date: 05/28/2015    History of Present Illness 25 y.o. female with medical history significant for Recurrent migraines, with prior hemiplegic component, tachycardia on chronic beta blockers presented to [place] experiencing a headache for 2 weeks that has been unresponsive to prednisone taper in outpatient setting, associated with photophobia. She developed right side weakness without numbness and left facial drooping on the morning of admission.    PT Comments    Patient is making progress with increased activity tolerance but continues to require min/mod A for ambulation and mod/max A for stair management due to R side weakness and R foot drop. Pt has flight of stairs in home. Due to pt's current mobility level, recommending CIR for further skilled PT services to maximize independence and safety for functional mobility.   Follow Up Recommendations  CIR;Supervision for mobility/OOB     Equipment Recommendations  Rolling walker with 5" wheels (youth RW)    Recommendations for Other Services OT consult     Precautions / Restrictions Precautions Precautions: Fall Precaution Comments: Rt side weakness Restrictions Weight Bearing Restrictions: No    Mobility  Bed Mobility Overal bed mobility: Modified Independent Bed Mobility: Sit to Supine       Sit to supine: Modified independent (Device/Increase time)   General bed mobility comments: increased time and HOB elevated  Transfers Overall transfer level: Needs assistance Equipment used: Rolling walker (2 wheeled) Transfers: Sit to/from Stand Sit to Stand: Min guard         General transfer comment: min guard for safety; cues for hand placement with encouragement to use R UE  Ambulation/Gait Ambulation/Gait assistance: Min assist Ambulation Distance (Feet): 150 Feet Assistive device:  (pushed IV pole) Gait  Pattern/deviations: Step-through pattern;Decreased step length - right;Decreased weight shift to right;Decreased stance time - right;Decreased dorsiflexion - right Gait velocity: decreased   General Gait Details: pt pushed IV pole with min A required for balance and to advance R LE forward as pt continues to demo instability with R LE weightbearing and inability to clear R foot completely when advancing forward; multiple standing rest breaks and one seated rest break needed; pt's SpO2 87-96% with ambulation and educated on pursed lip breathing; pt asymptomatic    Stairs Stairs: Yes Stairs assistance: Mod assist Stair Management: One rail Right;Sideways Number of Stairs: 2 General stair comments: mod/max A needed for safe management of stairs with pt using R hand rail; therapist blocked R knee when pt WB on R LE due to instability of R LE and inability to push through R UE for adequate support  Wheelchair Mobility    Modified Rankin (Stroke Patients Only)       Balance Overall balance assessment: Needs assistance   Sitting balance-Leahy Scale: Good     Standing balance support: Single extremity supported Standing balance-Leahy Scale: Poor                      Cognition Arousal/Alertness: Awake/alert Behavior During Therapy: WFL for tasks assessed/performed Overall Cognitive Status: Within Functional Limits for tasks assessed                      Exercises General Exercises - Lower Extremity Quad Sets: Right;Supine;AAROM;5 reps Heel Slides: Right;Supine;AAROM;5 reps Toe Raises: AAROM;Right;10 reps;Supine Other Exercises Other Exercises: theraputty exercise--medium soft Other Exercises: pt not able to tolerate resistive exercises with theraband, performed AROM B shoulders, elbows and forearms  x 10 each    General Comments        Pertinent Vitals/Pain Pain Assessment: No/denies pain    Home Living                      Prior Function             PT Goals (current goals can now be found in the care plan section) Acute Rehab PT Goals Patient Stated Goal: get back to being independent Progress towards PT goals: Progressing toward goals    Frequency  Min 3X/week    PT Plan Discharge plan needs to be updated    Co-evaluation             End of Session Equipment Utilized During Treatment: Gait belt Activity Tolerance: Patient tolerated treatment well Patient left: in bed;with call bell/phone within reach;with family/visitor present     Time: [id number] PT Time Calculation (min) (ACUTE ONLY): 45 min  Charges:  $Gait Training: 23-37 mins $Therapeutic Activity: 8-22 mins                    G Codes:      Salina April, PTA Pager: [phone]   05/28/2015, 2:21 PM

## 2015-05-28 NOTE — Progress Notes (Signed)
Occupational Therapy Treatment Patient Details Name: Amy Rivas MRN: [id number] DOB: 1990-03-10 Today's Date: 05/28/2015    History of present illness 25 y.o. female with medical history significant for Recurrent migraines, with prior hemiplegic component, tachycardia on chronic beta blockers presented to [place] experiencing a headache for 2 weeks that has been unresponsive to prednisone taper in outpatient setting, associated with photophobia. She developed right side weakness without numbness and left facial drooping on the morning of admission.   OT comments  Pt continues to demonstrate significant weakness with inability to tolerate resistive UE exercise program, other than therapy putty. She remains a fall risk and reports feeling much safer with her RW. Pt could benefit from inpatient rehab, but reports needing to go home and care for her children as her husband does not qualify for FMLA and needs to return to work on Monday. Pt stating she will need to drive her son to school, yet is not able to manage the flight of stairs necessary to access her home. Pt does not have support of family or friends. Educated pt at length in home safety and work simplification. Instructed that she is not safe to drive.  Follow Up Recommendations  Home health OT;Supervision/Assistance - 24 hour (progressing to OPOT)    Equipment Recommendations  Tub/shower seat (youth size walker)    Recommendations for Other Services      Precautions / Restrictions Precautions Precautions: Fall Precaution Comments: Rt side weakness Restrictions Weight Bearing Restrictions: No       Mobility Bed Mobility Overal bed mobility: Modified Independent Bed Mobility: Sit to Supine       Sit to supine: Modified independent (Device/Increase time)   General bed mobility comments: increased time and HOB elevated  Transfers Overall transfer level: Needs assistance Equipment used: Rolling walker (2  wheeled) Transfers: Sit to/from Stand Sit to Stand: Min guard         General transfer comment: min guard for safety; cues for hand placement with encouragement to use R UE    Balance     Sitting balance-Leahy Scale: Good       Standing balance-Leahy Scale: Poor                     ADL Overall ADL's : Needs assistance/impaired Eating/Feeding: Independent;Sitting Eating/Feeding Details (indicate cue type and reason): able to use R hand to self feed and open containers Grooming: Min guard;Wash/dry hands;Standing                   Armed forces technical officer: Min guard;Ambulation;RW   Toileting- Water quality scientist and Hygiene: Min guard;Sit to/from stand       Functional mobility during ADLs: Min guard;Rolling walker General ADL Comments: Pt reports feeling much safer with use of RW. Instructed pt to keep all DME when she discharges in the event of another exacerbation.       Vision                     Perception     Praxis      Cognition   Behavior During Therapy: WFL for tasks assessed/performed Overall Cognitive Status: Within Functional Limits for tasks assessed                       Extremity/Trunk Assessment               Exercises Other Exercises Other Exercises: theraputty exercise--medium soft Other Exercises: pt not able to  tolerate resistive exercises with theraband, performed AROM B shoulders, elbows and forearms x 10 each   Shoulder Instructions       General Comments      Pertinent Vitals/ Pain       Pain Assessment: No/denies pain  Home Living                                          Prior Functioning/Environment              Frequency Min 3X/week     Progress Toward Goals  OT Goals(current goals can now be found in the care plan section)  Progress towards OT goals: Progressing toward goals  Acute Rehab OT Goals Patient Stated Goal: get back to being independent Time For Goal  Achievement: 06/03/15 Potential to Achieve Goals: Good  Plan Discharge plan remains appropriate    Co-evaluation                 End of Session Equipment Utilized During Treatment: Rolling walker   Activity Tolerance Patient limited by fatigue   Patient Left in chair;with call bell/phone within reach   Nurse Communication          Time: [id number] OT Time Calculation (min): 47 min  Charges: OT General Charges $OT Visit: 1 Procedure OT Treatments $Self Care/Home Management : 23-37 mins $Therapeutic Exercise: 8-22 mins  Malka So 05/28/2015, 1:50 PM [phone]

## 2015-05-28 NOTE — Progress Notes (Signed)
[facility]                                                                              Patient Demographics  Amy Rivas, is a 25 y.o. female, DOB - 04/05/1990, [id number]  Admit date - 05/25/2015   Admitting Physician Waldemar Dickens, MD  Outpatient Primary MD for the patient is Osa Craver, MD  Outpatient specialists: Dr Payton Spark neurology  LOS - 3  days    Chief Complaint  Patient presents with  . Code Stroke       Brief summary   Amy Rivas is a 25 y.o. female with medical history significant for Recurrent migraines, with prior hemiplegic component, tachycardia on chronic beta blockers presented to [place] experiencing a headache for 2 weeks that has been unresponsive to prednisone taper in outpatient setting, associated with photophobia. She developed right side weakness without numbness and left facial drooping on the morning of admission. MRI of the brain in ED showed constellation of white matter lesions consistent with edema and urinating disease. Neurology was consulted and patient was started on high-dose IV steroids and admitted for further workup.  Patient is on day #4 of IV steroids, needs 1 more day on 5/13 Underwent LP, follow labs, oligoclonal bands Made appointment with Dr. Tomi Likens Possible DC in a.m.   Assessment & Plan    Principal Problem:   Acute right-sided weakness/possible new dx Multiple sclerosis with exacerbation : Symptoms improving - MRI brain showed constellation of white matter lesions consistent with the mother and urinating disease, largest ill-defined left periventricular lesion.  -MRA brain showed possibility of fibromuscular dysplasia - MRI C-spine normal with no impingement or demyelination - There was a question of possible TIA, echo showed EF of 55-60% with normal wall motion - UDS negative, PT/OT/SLP evaluation-> home health PT - LDL 103, A1c 4.8 -Patient reports improvement after IV steroids,  Day #4 today (extended by neurology for total 5 days) - Outpatient appointment with Dr. Tomi Likens made on May 22nd (in [facility])    Intractable headache-resolved -Unclear if related to known history of migraines or this is reflective of potential diagnosis of MS with acute flare - Neurology following, overall improving    Sinus tachycardia  -Continue with preadmission beta blocker  Anxiety - Improved significantly with Ativan  Code Status: full code  DVT Prophylaxis:  Lovenox   Family Communication: Discussed in detail with the patient, all imaging results, lab results explained to the patient in the room. Discussed with patient's husband in detail yesterday  Disposition Plan: Likely dc in a.m.  Time Spent in minutes   15 minutes  Procedures:  CT head MRI brain, mra 2-D echo MRI C-spine  Consultants:   Neurology    Antimicrobials: NONE    Medications  Scheduled Meds: . aspirin  300 mg Rectal Daily   Or  . aspirin  325 mg Oral Daily  . diphenhydrAMINE  25 mg Intravenous Once  . ketorolac  30 mg Intravenous Once  . magnesium sulfate 1 - 4 g bolus IVPB  2 g Intravenous Once  . methylPREDNISolone (SOLU-MEDROL) injection  1,000 mg Intravenous Daily  .  metoprolol tartrate  25 mg Oral BID  . ondansetron (ZOFRAN) IV  8 mg Intravenous Once  . prochlorperazine  10 mg Intravenous Once   Continuous Infusions:  PRN Meds:.LORazepam, ondansetron (ZOFRAN) IV, traMADol   Antibiotics   Anti-infectives    None        Subjective:   Amy Rivas was seen and examined today.  Feels a lot better today, right lower extremity weakness improving. Headache is improved. Her anxiety and jitteriness is also improved.  Patient denied dizziness, chest pain, shortness of breath, abdominal pain, N/V/D/C. No acute events overnight.    Objective:   Filed Vitals:   05/27/15 1422 05/27/15 2232 05/28/15 0202 05/28/15 0505  BP: 145/79 132/78 124/78 130/80  Pulse: 79 80 63 76  Temp:  98.1 F (36.7 C) 98.1 F (36.7 C) 97.9 F (36.6 C) 97.9 F (36.6 C)  TempSrc: Oral Oral Oral Oral  Resp: 16 17 16 16   Height:      Weight:      SpO2: 97% 98% 97% 98%    Intake/Output Summary (Last 24 hours) at 05/28/15 1136 Last data filed at 05/28/15 0200  Gross per 24 hour  Intake    360 ml  Output   1400 ml  Net  -1040 ml     Wt Readings from Last 3 Encounters:  05/25/15 45.859 kg (101 lb 1.6 oz)  03/05/14 45.45 kg (100 lb 3.2 oz)  01/22/14 47.174 kg (104 lb)     Exam  General: Alert and oriented x 3, NAD  HEENT:    Neck:   Cardiovascular: S1 S2 clear, RRR  Respiratory: CTAB, no wheezing   Gastrointestinal: Soft, nontender, nondistended, + bowel sounds  Ext: no cyanosis clubbing or edema  Neuro: Right-sided weakness improving but lower extremity still weak, but improving  Skin: No rashes  Psych: Normal affect and demeanor, alert and oriented x3    Data Reviewed:  I have personally reviewed following labs and imaging studies  Micro Results No results found for this or any previous visit (from the past 240 hour(s)).  Radiology Reports Ct Head Wo Contrast  05/25/2015  CLINICAL DATA:  New onset right-sided weakness and left facial droop. Headache. EXAM: CT HEAD WITHOUT CONTRAST TECHNIQUE: Contiguous axial images were obtained from the base of the skull through the vertex without intravenous contrast. COMPARISON:  Head CT January 22, 2014 and brain MRI  January 24, 2014 FINDINGS: The ventricles are normal in size and configuration. There is no intracranial mass, hemorrhage, extra-axial fluid collection, or midline shift. Gray-white compartments appear normal. No acute infarct evident. The middle cerebral artery attenuation is normal bilaterally. Bony calvarium appears intact. The mastoid air cells are clear. No intraorbital lesions are evident. IMPRESSION: Study within normal limits. Critical Value/emergent results were called by telephone at the time of  interpretation on 05/25/2015 at 9:51 am to Dr. Roland Rack, who verbally acknowledged these results. Electronically Signed   By: Lowella Grip III M.D.   On: 05/25/2015 09:52   Mr Jodene Nam Head Wo Contrast  05/26/2015  CLINICAL DATA:  Right-sided weakness. EXAM: MRA HEAD WITHOUT CONTRAST TECHNIQUE: Angiographic images of the Circle of Willis were obtained using MRA technique without intravenous contrast. COMPARISON:  Brain MRI from yesterday FINDINGS: Symmetric carotid arteries. Right vertebral artery dominance. Fetal type PCA circulation. Dominant right PICA and left AICA. The bilateral distal cervical ICA, at the skullbase, have a mildly beaded appearance. The upper margin of the left M1 segment also has a lobulated appearance,  most distally related to a lenticulostriate infundibulum. No diffuse beading of vessels as permitted by technical factors. Negative for aneurysm. No major branch occlusion. IMPRESSION: 1. Artifactual or mild beading of the distal cervical ICA raise the possibility of fibromuscular dysplasia. 2. Lobulated appearance of the left M1 segment of indeterminate significance in isolation. No diffuse beading typical of vasculitis or RCVS. Consider correlation with CSF. Electronically Signed   By: Monte Fantasia M.D.   On: 05/26/2015 10:51   Mr Jeri Cos [id number] Contrast  05/25/2015  CLINICAL DATA:  RIGHT-sided weakness. LEFT facial droop upon waking. Headache for 2 weeks. Neck pain. EXAM: MRI HEAD WITHOUT AND WITH CONTRAST TECHNIQUE: Multiplanar, multiecho pulse sequences of the brain and surrounding structures were obtained without and with intravenous contrast. CONTRAST:  85mL MULTIHANCE GADOBENATE DIMEGLUMINE 529 MG/ML IV SOLN COMPARISON:  CT head earlier today.  MR head 01/24/2014. FINDINGS: Single focus of mildly restricted +/- facilitated diffusion, LEFT centrum semiovale, corresponding to a 1 cm area of dominant ill-defined periventricular white matter signal abnormality on T2 and FLAIR  imaging. Smaller periventricular greater than subcortical supratentorial lesions elsewhere on T2 and FLAIR, inapparent on diffusion imaging. No postcontrast enhancement at any location. No involvement of the cerebellum or brainstem. No cortical lesions. No evidence for acute stroke or hemorrhage. No extra-axial fluid collection. Normal cerebral volume upper cervical region unremarkable. No tonsillar herniation. Pituitary. Extracranial soft tissues unremarkable. Post infusion imaging through the entire brain demonstrates no abnormal enhancement. Compared with 2016, white matter lesions were not present. IMPRESSION: Constellation of white matter lesions most consistent with demyelinating disease. Periventricular predominance, and interval development since January 2016 suggest a subacute to chronic time course. Equivocal diffusion restriction on today's imaging, in conjunction with RIGHT-sided weakness, suggesting that the largest, ill-defined, LEFT periventricular lesion may be more acute. No associated postcontrast enhancement however is demonstrated. Electronically Signed   By: Staci Righter M.D.   On: 05/25/2015 11:58   Mr Cervical Spine Wo Contrast  05/26/2015  CLINICAL DATA:  Right-sided weakness. Possible demyelinating disease on brain MRI. EXAM: MRI CERVICAL SPINE WITHOUT CONTRAST TECHNIQUE: Multiplanar, multisequence MR imaging of the cervical spine was performed. No intravenous contrast was administered. COMPARISON:  None. FINDINGS: Normal cord signal and morphology. No evidence of cord demyelination. Subtle T2 and STIR hyperintensity in the medulla is favored artifactual. No abnormality seen in this region on previous brain MRI. There is no degenerative change or stenosis. Normal flow related signal loss in the visualized vertebral and carotid arteries. IMPRESSION: Normal cervical spine.  No impingement or evidence of demyelination. Electronically Signed   By: Monte Fantasia M.D.   On: 05/26/2015 10:08      Lab Data:  CBC:  Recent Labs Lab 05/25/15 0937 05/25/15 0942 05/27/15 0807 05/28/15 0658  WBC 8.8  --  16.2* 16.8*  NEUTROABS 5.3  --   --   --   HGB 16.2* 17.3* 14.2 15.2*  HCT 46.6* 51.0* 41.5 43.6  MCV 91.2  --  90.6 90.6  PLT 270  --  271 [id number]   Basic Metabolic Panel:  Recent Labs Lab 05/25/15 0937 05/25/15 0942 05/27/15 0807 05/28/15 0658  NA 140 140 137 137  K 4.2 4.3 4.6 4.7  CL 107 105 101 101  CO2 23  --  24 26  GLUCOSE 79 77 115* 117*  BUN 10 12 16 16   CREATININE 0.78 0.70 0.65 0.73  CALCIUM 9.4  --  9.6 9.6   GFR: Estimated Creatinine Clearance: 68.8 mL/min (by  C-G formula based on Cr of 0.73). Liver Function Tests:  Recent Labs Lab 05/25/15 0937  AST 26  ALT 18  ALKPHOS 39  BILITOT 1.0  PROT 6.9  ALBUMIN 4.3   No results for input(s): LIPASE, AMYLASE in the last 168 hours. No results for input(s): AMMONIA in the last 168 hours. Coagulation Profile:  Recent Labs Lab 05/25/15 0937  INR 1.10   Cardiac Enzymes: No results for input(s): CKTOTAL, CKMB, CKMBINDEX, TROPONINI in the last 168 hours. BNP (last 3 results) No results for input(s): PROBNP in the last 8760 hours. HbA1C:  Recent Labs  05/26/15 0539  HGBA1C 4.8   CBG: No results for input(s): GLUCAP in the last 168 hours. Lipid Profile:  Recent Labs  05/26/15 0539  CHOL 179  HDL 57  LDLCALC 103*  TRIG 93  CHOLHDL 3.1   Thyroid Function Tests: No results for input(s): TSH, T4TOTAL, FREET4, T3FREE, THYROIDAB in the last 72 hours. Anemia Panel: No results for input(s): VITAMINB12, FOLATE, FERRITIN, TIBC, IRON, RETICCTPCT in the last 72 hours. Urine analysis:    Component Value Date/Time   COLORURINE YELLOW 05/25/2015 1150   APPEARANCEUR CLEAR 05/25/2015 1150   LABSPEC 1.011 05/25/2015 1150   PHURINE 7.0 05/25/2015 1150   GLUCOSEU NEGATIVE 05/25/2015 1150   HGBUR NEGATIVE 05/25/2015 [place] 05/25/2015 1150   KETONESUR NEGATIVE 05/25/2015  1150   PROTEINUR NEGATIVE 05/25/2015 1150   UROBILINOGEN 0.2 01/22/2014 1606   NITRITE NEGATIVE 05/25/2015 1150   LEUKOCYTESUR NEGATIVE 05/25/2015 Pierz M.D. [facility] 05/28/2015, 11:36 AM  Pager: [phone] Between 7am to 7pm - call Pager - [phone]  After 7pm go to [email or web address] - password [email or web address]  Call night coverage person covering after 7pm

## 2015-05-28 NOTE — Progress Notes (Signed)
Subjective: Feels that she is continuing to improve  Exam: Filed Vitals:   05/28/15 0505 05/28/15 1428  BP: 130/80 116/82  Pulse: 76 92  Temp: 97.9 F (36.6 C) 98 F (36.7 C)  Resp: 16 18   Gen: In bed, NAD Resp: non-labored breathing, no acute distress Abd: soft, nt  Neuro: MS: awake, alert, interactive and appropriate.  [id number], EOMI Motor: 4+/5 in the right leg, 5/5 in the left Sensory:intact to LT  Pertinent Labs: LP-no pleocytosis or elevated protein. Oligoclonal bands and IgG index are pending  Impression: 25 yo F with right sided weakness in the setting of headache and multiple T2 lesions on MRI. The acuity is odd, and the rate of recovery is quick as well for MS, but the imaging is concerning as is the history of a separate event of weakness, and a description of blurred vision concerning for optic neuritis.   I continue to suspect, however, that this likely represents MS.   Recommendations: 1) Solumedrol Last dose tomorrow morning. No taper following IV steroids.  2) can be discharged following last dose of Solu-Medrol. I will send a note to Dr. Tomi Likens and request follow-up.  Roland Rack, MD [facility] [phone]  If 7pm- 7am, please page neurology on call as listed in [place].

## 2015-05-29 LAB — CBC
HCT: 46.7 % — ABNORMAL HIGH (ref 36.0–46.0)
HEMOGLOBIN: 16.2 g/dL — AB (ref 12.0–15.0)
MCH: 31.7 pg (ref 26.0–34.0)
MCHC: 34.7 g/dL (ref 30.0–36.0)
MCV: 91.4 fL (ref 78.0–100.0)
Platelets: 278 10*3/uL (ref 150–400)
RBC: 5.11 MIL/uL (ref 3.87–5.11)
RDW: 12.4 % (ref 11.5–15.5)
WBC: 20.5 10*3/uL — ABNORMAL HIGH (ref 4.0–10.5)

## 2015-05-29 LAB — BASIC METABOLIC PANEL
Anion gap: 11 (ref 5–15)
BUN: 20 mg/dL (ref 6–20)
CALCIUM: 9.9 mg/dL (ref 8.9–10.3)
CHLORIDE: 103 mmol/L (ref 101–111)
CO2: 25 mmol/L (ref 22–32)
Creatinine, Ser: 0.69 mg/dL (ref 0.44–1.00)
GFR calc Af Amer: >60 mL/min (ref >60)
Glucose, Bld: 104 mg/dL — ABNORMAL HIGH (ref 65–99)
POTASSIUM: 4.1 mmol/L (ref 3.5–5.1)
Sodium: 139 mmol/L (ref 135–145)

## 2015-05-29 NOTE — Progress Notes (Signed)
Subjective: Continues to improve.   Exam: Filed Vitals:   05/28/15 2109 05/29/15 0507  BP: 125/72 105/64  Pulse: 96 61  Temp: 98 F (36.7 C) 98 F (36.7 C)  Resp: 18 16   Gen: In bed, NAD Resp: non-labored breathing, no acute distress Abd: soft, nt  Neuro: MS: awake, alert, interactive and appropriate.  [id number], EOMI Motor: 4+/5 in the right leg, 5/5 in the left Sensory:intact to LT  Pertinent Labs: LP-no pleocytosis or elevated protein. Oligoclonal bands pending  Impression: 25 yo F with right sided weakness in the setting of headache and multiple T2 lesions on MRI. The acuity is odd, and the rate of recovery is quick as well for MS, but the imaging is concerning as is the history of a separate event of weakness, and a description of blurred vision concerning for optic neuritis.   I continue to suspect that this likely represents MS.   Recommendations: 1) Solumedrol Last dose today. No taper following IV steroids.  2) No further recommendations following last dose of Solu-Medrol. Please call with further questions or concerns.  3) Has appt 5/22 with Dr. Tomi Likens.   Roland Rack, MD [facility] [phone]  If 7pm- 7am, please page neurology on call as listed in [place].

## 2015-05-29 NOTE — Progress Notes (Signed)
Physical Therapy Treatment Patient Details Name: Amy Rivas MRN: [id number] DOB: 19-Jan-1990 Today's Date: 05/29/2015    History of Present Illness 25 y.o. female with medical history significant for Recurrent migraines, with prior hemiplegic component, tachycardia on chronic beta blockers presented to [place] experiencing a headache for 2 weeks that has been unresponsive to prednisone taper in outpatient setting, associated with photophobia. She developed right side weakness without numbness and left facial drooping on the morning of admission.    PT Comments    Pt was seen for stair climbing again due to her difficulties with getting up steps.  The pt is talking with her family about the recommendation for CIR as she is wanting to go home.  The pt is very weak and needs substantial help to get up steps.  Has a plan in place for evaluation and will see what is decided since HHPT cannot see her.  Follow Up Recommendations  CIR;Supervision for mobility/OOB     Equipment Recommendations  Rolling walker with 5" wheels (youth RW)    Recommendations for Other Services OT consult     Precautions / Restrictions Precautions Precautions: Fall Precaution Comments: Rt side weakness Restrictions Weight Bearing Restrictions: No    Mobility  Bed Mobility Overal bed mobility: Modified Independent Bed Mobility: Supine to Sit     Supine to sit: Supervision (for safety)        Transfers Overall transfer level: Needs assistance Equipment used: Rolling walker (2 wheeled) Transfers: Sit to/from [facility] Sit to Stand: Min guard Stand pivot transfers: Min guard       General transfer comment: cued hand placement again  Ambulation/Gait Ambulation/Gait assistance: Min assist;Mod assist Ambulation Distance (Feet): 30 Feet Assistive device: 1 person hand held assist;Rolling walker (2 wheeled) Gait Pattern/deviations: Step-through pattern;Narrow base of  support;Trunk flexed Gait velocity: decreased Gait velocity interpretation: Below normal speed for age/gender General Gait Details: walker with cues, IV pole was not attached   Stairs Stairs: Yes Stairs assistance: Mod assist Stair Management: One rail Right Number of Stairs: 10 General stair comments: Pt was able to climb with min/mod assist of PT but buckled appearance of her RLE   Wheelchair Mobility    Modified Rankin (Stroke Patients Only)       Balance Overall balance assessment: Needs assistance   Sitting balance-Leahy Scale: Good     Standing balance support: Bilateral upper extremity supported Standing balance-Leahy Scale: Poor                      Cognition Arousal/Alertness: Awake/alert Behavior During Therapy: WFL for tasks assessed/performed Overall Cognitive Status: Within Functional Limits for tasks assessed                      Exercises      General Comments General comments (skin integrity, edema, etc.): Pt and family are discussing her staying for CIR evaluation versus a trip home where husband will be there in the daytime for one week.  PT expressed concern that her husband not be there and her gait be unstable esp since HHPT is not an option.  Will continue to recommend the PT option but will have to await her decision to stay versus go home.      Pertinent Vitals/Pain Pain Assessment: No/denies pain    Home Living                      Prior Function  PT Goals (current goals can now be found in the care plan section) Acute Rehab PT Goals Patient Stated Goal: get back to being independent Progress towards PT goals: Progressing toward goals    Frequency  Min 3X/week    PT Plan Current plan remains appropriate    Co-evaluation             End of Session Equipment Utilized During Treatment: Gait belt Activity Tolerance: Patient limited by fatigue Patient left: in chair;with call bell/phone  within reach;with family/visitor present     Time: 1359-1435 PT Time Calculation (min) (ACUTE ONLY): 36 min  Charges:  $Gait Training: 8-22 mins $Therapeutic Activity: 8-22 mins                    G Codes:      Ramond Dial 2015-06-09, 3:48 PM    Mee Hives, PT MS Acute Rehab Dept. Number: [facility] [phone] and [place] [phone]

## 2015-05-29 NOTE — Care Management Note (Signed)
Case Management Note  Patient Details  Name: Amy Rivas MRN: [id number] Date of Birth: 20-Nov-1990  Subjective/Objective:     MS flare               Action/Plan: Discharge Planning: AVS reviewed:  NCM spoke to pt and states she prefers to go to outpatient PT. Faxed referral to [facility] to contact pt to arrange appt. Pt requesting RW, tub bench and 3n1 for home. Contacted [facility] DME rep for equipment. Pt states husband at home to assist with her care.   PCP- Osa Craver MD  Expected Discharge Date:  05/29/2015             Expected Discharge Plan:  Home/Self Care  In-House Referral:  NA  Discharge planning Services  CM Consult  Post Acute Care Choice:  NA Choice offered to:  NA  DME Arranged:  3-N-1, Tub bench, Walker rolling DME Agency:  [place]:  NA HH Agency:  NA  Status of Service:  Completed, signed off  Medicare Important Message Given:    Date Medicare IM Given:    Medicare IM give by:    Date Additional Medicare IM Given:    Additional Medicare Important Message give by:     If discussed at [place] of Stay Meetings, dates discussed:    Additional Comments:  Erenest Rasher, RN 05/29/2015, 5:48 PM

## 2015-05-29 NOTE — Discharge Summary (Addendum)
Physician Discharge Summary  Amy Rivas  GEX:[id number]  DOB: 05/24/1990  DOA: 05/25/2015  PCP: Osa Craver, MD  Admit date: 05/25/2015 Discharge date: 05/29/2015  Time spent: Greater than 30 minutes  Recommendations for Outpatient Follow-up:  1. Dr. Eloise Levels, PCP in 2 weeks.To be seen with repeat labs (CBC). 2. Dr. Metta Clines, Neurology on 06/07/15 at 3:15 PM. Please follow CSF oligoclonal bands results. 3. Outpatient physical therapy 4. DME: Rolling walker and tub bench.   Discharge Diagnoses:  Principal Problem:   Acute right-sided weakness Active Problems:   Sinus tachycardia (HCC)   Multiple sclerosis exacerbation (HCC)   Intractable headache   Adjustment disorder with anxious mood   Discharge Condition: Improved & Stable  Diet recommendation: Heart healthy diet.  Filed Weights   05/25/15 0959 05/25/15 1642  Weight: 46.2 kg (101 lb 13.6 oz) 45.859 kg (101 lb 1.6 oz)    History of present illness:  25 year old female patient with history of recurrent migraines, with prior hemiplegic complement, tachycardia on chronic beta blockers, presented to the ED after experiencing headache associated with photophobia, for 2 weeks that had been unresponsive to prednisone taper in the outpatient setting. She developed right-sided weakness without numbness and left facial drooping on the morning of admission. Hospitalized for further evaluation and management. Neurology was consulted.  Hospital Course:   Likely multiple sclerosis flare - Neurology was consulted. MRI brain showed constellation of white matter lesions most consistent with demyelinating disease. LP was done and oligoclonal band results are pending. Neurology treated her with 5 days of IV steroids. As per neurology, suspect that this likely represents multiple sclerosis (however they indicated that the acuity is odd and the rate of recovery is quick for MS but the imaging is concerning as is the history of a  separate event of weakness and a description of blurred vision concerning for optic neuritis). They recommend no taper of steroids after today. Patient has outpatient neurology follow-up appointment on 5/22. PT was consulted and recommend CIR but patient and spouse declined this and want to proceed with outpatient PT and DME which have been arranged by case management. Right-sided weakness has improved. No further headaches. - Discussed with Dr. Leonel Ramsay: Her presentation was not consistent with CVA or TIA. - ESR: 2. CRP <0.5. A1c: 4.8. LDL 103. UDS negative. Blood alcohol level <5.  Intractable headache - Resolved. Unclear etiology.  Sinus tachycardia - Continue preadmission beta blockers.  Leukocytosis - Probably from steroids. Hemoglobin is also mildly elevated-unclear significance. Outpatient follow-up with PCP in 2 weeks with repeat CBC.  Consultations:  Neurology  Procedures:  Lumbar puncture   2-D echo 05/26/15: Study Conclusions  - Left ventricle: The cavity size was normal. Systolic function was  normal. The estimated ejection fraction was in the range of 55%  to 60%. Wall motion was normal; there were no regional wall  motion abnormalities. Left ventricular diastolic function  parameters were normal. - Mitral valve: There was no regurgitation. - Right ventricle: The cavity size was normal. Wall thickness was  normal. Systolic function was normal. - Atrial septum: No defect or patent foramen ovale was identified  by color flow Doppler. - Tricuspid valve: There was no regurgitation. - Inferior vena cava: The vessel was normal in size. The  respirophasic diameter changes were in the normal range (>= 50%),  consistent with normal central venous pressure.  Impressions:  - Normal echo.   Discharge Exam:  Complaints:  No headaches reported. Indicates that facial asymmetry has  resolved which was confirmed by spouse at bedside. Right-sided weakness improved  but right lower extremity weaker than right upper extremity. Patient is right-handed. Anxious to return home to her 2 young children.  Filed Vitals:   05/28/15 1428 05/28/15 2109 05/29/15 0507 05/29/15 1430  BP: 116/82 125/72 105/64 116/82  Pulse: 92 96 61 101  Temp: 98 F (36.7 C) 98 F (36.7 C) 98 F (36.7 C)   TempSrc: Oral  Oral   Resp: '18 18 16 20  '$ Height:      Weight:      SpO2: 99% 98% 99% 98%    General exam: Pleasant young female sitting up comfortably in bed this morning. Respiratory system: Clear. No increased work of breathing. Cardiovascular system: S1 & S2 heard, RRR. No JVD, murmurs, gallops, clicks or pedal edema. Telemetry: SB in the 50s-SR Gastrointestinal system: Abdomen is nondistended, soft and nontender. Normal bowel sounds heard. Central nervous system: Alert and oriented. ?? Subtle facial asymmetry on smiling. Extremities: 5 x 5 power in the left extremities. Grade 4+ by 5 power in right upper extremity and grade 4 x 5 power in the right lower extremity.  Discharge Instructions      Discharge Instructions    Ambulatory referral to Neurology    Complete by:  As directed   An appointment is requested in approximately: 2 weeks     Call MD for:  severe uncontrolled pain    Complete by:  As directed      Call MD for:    Complete by:  As directed   Worsening weakness.     Diet - low sodium heart healthy    Complete by:  As directed      Driving Restrictions    Complete by:  As directed   No driving until cleared by your physician during outpatient follow-up.     Increase activity slowly    Complete by:  As directed             Medication List    STOP taking these medications        diphenhydrAMINE 25 MG tablet  Commonly known as:  BENADRYL     ketoprofen 50 MG capsule  Commonly known as:  ORUDIS     metoCLOPramide 10 MG tablet  Commonly known as:  REGLAN     ondansetron 4 MG disintegrating tablet  Commonly known as:  ZOFRAN ODT      predniSONE 10 MG tablet  Commonly known as:  DELTASONE      TAKE these medications        acetaminophen 500 MG tablet  Commonly known as:  TYLENOL  Take 1,000 mg by mouth every 6 (six) hours as needed for fever.     ibuprofen 200 MG tablet  Commonly known as:  ADVIL,MOTRIN  Take 200 mg by mouth every 6 (six) hours as needed.     metoprolol tartrate 25 MG tablet  Commonly known as:  LOPRESSOR  Take 25 mg by mouth 2 (two) times daily.       Follow-up Information    Follow up with Dudley Major, DO On 06/07/2015.   Specialty:  Neurology   Why:  at 3:15PM , for hospital follow-up   Contact information:   [place] [id number] [phone]       Follow up with Osa Craver, MD. Schedule an appointment as soon as possible for a visit in 2 weeks.   Specialty:  Internal Medicine   Why:  for hospital follow-up   Contact information:   [place] [address] [phone]       Get Medicines reviewed and adjusted: Please take all your medications with you for your next visit with your Primary MD  Please request your Primary MD to go over all hospital tests and procedure/radiological results at the follow up. Please ask your Primary MD to get all Hospital records sent to his/her office.  If you experience worsening of your admission symptoms, develop shortness of breath, life threatening emergency, suicidal or homicidal thoughts you must seek medical attention immediately by calling 911 or calling your MD immediately if symptoms less severe.  You must read complete instructions/literature along with all the possible adverse reactions/side effects for all the Medicines you take and that have been prescribed to you. Take any new Medicines after you have completely understood and accept all the possible adverse reactions/side effects.   Do not drive when taking pain medications.   Do not take more than prescribed Pain,  Sleep and Anxiety Medications  Special Instructions: If you have smoked or chewed Tobacco in the last 2 yrs please stop smoking, stop any regular Alcohol and or any Recreational drug use.  Wear Seat belts while driving.  Please note  You were cared for by a hospitalist during your hospital stay. Once you are discharged, your primary care physician will handle any further medical issues. Please note that NO REFILLS for any discharge medications will be authorized once you are discharged, as it is imperative that you return to your primary care physician (or establish a relationship with a primary care physician if you do not have one) for your aftercare needs so that they can reassess your need for medications and monitor your lab values.    The results of significant diagnostics from this hospitalization (including imaging, microbiology, ancillary and laboratory) are listed below for reference.    Significant Diagnostic Studies: Ct Head Wo Contrast  05/25/2015  CLINICAL DATA:  New onset right-sided weakness and left facial droop. Headache. EXAM: CT HEAD WITHOUT CONTRAST TECHNIQUE: Contiguous axial images were obtained from the base of the skull through the vertex without intravenous contrast. COMPARISON:  Head CT January 22, 2014 and brain MRI  January 24, 2014 FINDINGS: The ventricles are normal in size and configuration. There is no intracranial mass, hemorrhage, extra-axial fluid collection, or midline shift. Gray-white compartments appear normal. No acute infarct evident. The middle cerebral artery attenuation is normal bilaterally. Bony calvarium appears intact. The mastoid air cells are clear. No intraorbital lesions are evident. IMPRESSION: Study within normal limits. Critical Value/emergent results were called by telephone at the time of interpretation on 05/25/2015 at 9:51 am to Dr. Roland Rack, who verbally acknowledged these results. Electronically Signed   By: Lowella Grip III  M.D.   On: 05/25/2015 09:52   Mr Jodene Nam Head Wo Contrast  05/26/2015  CLINICAL DATA:  Right-sided weakness. EXAM: MRA HEAD WITHOUT CONTRAST TECHNIQUE: Angiographic images of the Circle of Willis were obtained using MRA technique without intravenous contrast. COMPARISON:  Brain MRI from yesterday FINDINGS: Symmetric carotid arteries. Right vertebral artery dominance. Fetal type PCA circulation. Dominant right PICA and left AICA. The bilateral distal cervical ICA, at the skullbase, have a mildly beaded appearance. The upper margin of the left M1 segment also has a lobulated appearance, most distally related to a lenticulostriate infundibulum. No diffuse beading of vessels as permitted by technical factors. Negative for aneurysm.  No major branch occlusion. IMPRESSION: 1. Artifactual or mild beading of the distal cervical ICA raise the possibility of fibromuscular dysplasia. 2. Lobulated appearance of the left M1 segment of indeterminate significance in isolation. No diffuse beading typical of vasculitis or RCVS. Consider correlation with CSF. Electronically Signed   By: Monte Fantasia M.D.   On: 05/26/2015 10:51   Mr Jeri Cos RC Contrast  05/25/2015  CLINICAL DATA:  RIGHT-sided weakness. LEFT facial droop upon waking. Headache for 2 weeks. Neck pain. EXAM: MRI HEAD WITHOUT AND WITH CONTRAST TECHNIQUE: Multiplanar, multiecho pulse sequences of the brain and surrounding structures were obtained without and with intravenous contrast. CONTRAST:  49m MULTIHANCE GADOBENATE DIMEGLUMINE 529 MG/ML IV SOLN COMPARISON:  CT head earlier today.  MR head 01/24/2014. FINDINGS: Single focus of mildly restricted +/- facilitated diffusion, LEFT centrum semiovale, corresponding to a 1 cm area of dominant ill-defined periventricular white matter signal abnormality on T2 and FLAIR imaging. Smaller periventricular greater than subcortical supratentorial lesions elsewhere on T2 and FLAIR, inapparent on diffusion imaging. No postcontrast  enhancement at any location. No involvement of the cerebellum or brainstem. No cortical lesions. No evidence for acute stroke or hemorrhage. No extra-axial fluid collection. Normal cerebral volume upper cervical region unremarkable. No tonsillar herniation. Pituitary. Extracranial soft tissues unremarkable. Post infusion imaging through the entire brain demonstrates no abnormal enhancement. Compared with 2016, white matter lesions were not present. IMPRESSION: Constellation of white matter lesions most consistent with demyelinating disease. Periventricular predominance, and interval development since January 2016 suggest a subacute to chronic time course. Equivocal diffusion restriction on today's imaging, in conjunction with RIGHT-sided weakness, suggesting that the largest, ill-defined, LEFT periventricular lesion may be more acute. No associated postcontrast enhancement however is demonstrated. Electronically Signed   By: JStaci RighterM.D.   On: 05/25/2015 11:58   Mr Cervical Spine Wo Contrast  05/26/2015  CLINICAL DATA:  Right-sided weakness. Possible demyelinating disease on brain MRI. EXAM: MRI CERVICAL SPINE WITHOUT CONTRAST TECHNIQUE: Multiplanar, multisequence MR imaging of the cervical spine was performed. No intravenous contrast was administered. COMPARISON:  None. FINDINGS: Normal cord signal and morphology. No evidence of cord demyelination. Subtle T2 and STIR hyperintensity in the medulla is favored artifactual. No abnormality seen in this region on previous brain MRI. There is no degenerative change or stenosis. Normal flow related signal loss in the visualized vertebral and carotid arteries. IMPRESSION: Normal cervical spine.  No impingement or evidence of demyelination. Electronically Signed   By: JMonte FantasiaM.D.   On: 05/26/2015 10:08    Microbiology: No results found for this or any previous visit (from the past 240 hour(s)).   Labs: Basic Metabolic Panel:  Recent Labs Lab  05/25/15 0937 05/25/15 0942 05/27/15 0807 05/28/15 0658 05/29/15 0649  NA 140 140 137 137 139  K 4.2 4.3 4.6 4.7 4.1  CL 107 105 101 101 103  CO2 23  --  '24 26 25  '$ GLUCOSE 79 77 115* 117* 104*  BUN '10 12 16 16 20  '$ CREATININE 0.78 0.70 0.65 0.73 0.69  CALCIUM 9.4  --  9.6 9.6 9.9   Liver Function Tests:  Recent Labs Lab 05/25/15 0937  AST 26  ALT 18  ALKPHOS 39  BILITOT 1.0  PROT 6.9  ALBUMIN 4.3   No results for input(s): LIPASE, AMYLASE in the last 168 hours. No results for input(s): AMMONIA in the last 168 hours. CBC:  Recent Labs Lab 05/25/15 0163805/09/17 [id number]/11/17 [id number]/12/17 [id number]/13/17 02482  WBC 8.8  --  16.2* 16.8* 20.5*  NEUTROABS 5.3  --   --   --   --   HGB 16.2* 17.3* 14.2 15.2* 16.2*  HCT 46.6* 51.0* 41.5 43.6 46.7*  MCV 91.2  --  90.6 90.6 91.4  PLT 270  --  271 299 278   Cardiac Enzymes: No results for input(s): CKTOTAL, CKMB, CKMBINDEX, TROPONINI in the last 168 hours. BNP: BNP (last 3 results) No results for input(s): BNP in the last 8760 hours.  ProBNP (last 3 results) No results for input(s): PROBNP in the last 8760 hours.  CBG: No results for input(s): GLUCAP in the last 168 hours.     Signed:  Vernell Leep, MD, FACP, FHM. [facility] Pager [phone]  If 7PM-7AM, please contact night-coverage [email or web address] Password [facility] 05/29/2015, 3:49 PM

## 2015-05-29 NOTE — Discharge Instructions (Signed)
Multiple Sclerosis °Multiple sclerosis (MS) is a disease of the central nervous system. It leads to the loss of the insulating covering of the nerves (myelin sheath) of your brain. When this happens, brain signals do not get sent properly or may not get sent at all. The age of onset of MS varies.  °CAUSES °The cause of MS is unknown. However, it is more common in the [place] than in the southern [place]. °RISK FACTORS °There is a higher number of women with MS than men. MS is not an illness that is passed down to you from your family members (inherited). However, your risk of MS is higher if you have a relative with MS. °SIGNS AND SYMPTOMS  °The symptoms of MS occur in episodes or attacks. These attacks may last weeks to months. There may be long periods of almost no symptoms between attacks. The symptoms of MS vary. This is because of the many different ways it affects the central nervous system. The main symptoms of MS include: °· Vision problems and eye pain. °· Numbness. °· Weakness. °· Inability to move your arms, hands, feet, or legs (paralysis). °· Balance problems. °· Tremors. °DIAGNOSIS  °Your health care provider can diagnose MS with the help of imaging exams and lab tests. These may include specialized X-ray exams and spinal fluid tests. The best imaging exam to confirm a diagnosis of MS is an MRI. °TREATMENT  °There is no known cure for MS, but there are medicines that can decrease the number and frequency of attacks. Steroids are often used for short-term relief. Physical and occupational therapy may also help. There are also many new alternative or complementary treatments available to help control the symptoms of MS. Ask your health care provider if any of these other options are right for you. °HOME CARE INSTRUCTIONS  °· Take medicines as directed by your health care provider. °· Exercise as directed by your health care provider. °SEEK MEDICAL CARE IF: °You begin to feel  depressed. °SEEK IMMEDIATE MEDICAL CARE IF: °· You develop paralysis. °· You have problems with bladder, bowel, or sexual function. °· You develop mental changes, such as forgetfulness or mood swings. °· You have a period of uncontrolled movements (seizure). °  °This information is not intended to replace advice given to you by your health care provider. Make sure you discuss any questions you have with your health care provider. °  °Document Released: 12/31/1999 Document Revised: 01/07/2013 Document Reviewed: 09/09/2012 °Elsevier Interactive Patient Education ©2016 [employer] ° °

## 2015-05-31 NOTE — Progress Notes (Signed)
NCM contacted pt to give contact information for [facility]. Faxed new referral to [facility]. Explained they will give her a call once referral processed to arrange appt or explained she can call later today to arrange appt. Jonnie Finner RN CCM Case Mgmt phone [phone]

## 2015-06-01 LAB — OLIGOCLONAL BANDS, CSF + SERM

## 2015-06-03 ENCOUNTER — Ambulatory Visit: Payer: Medicaid Other

## 2015-06-07 ENCOUNTER — Encounter: Payer: Self-pay | Admitting: Neurology

## 2015-06-07 ENCOUNTER — Other Ambulatory Visit (INDEPENDENT_AMBULATORY_CARE_PROVIDER_SITE_OTHER): Payer: Medicaid Other

## 2015-06-07 ENCOUNTER — Ambulatory Visit (INDEPENDENT_AMBULATORY_CARE_PROVIDER_SITE_OTHER): Payer: Medicaid Other | Admitting: Neurology

## 2015-06-07 VITALS — BP 100/78 | HR 111 | Ht <= 58 in | Wt 96.4 lb

## 2015-06-07 DIAGNOSIS — G35 Multiple sclerosis: Secondary | ICD-10-CM | POA: Diagnosis not present

## 2015-06-07 LAB — CBC WITH DIFFERENTIAL/PLATELET
BASOS ABS: 0 10*3/uL (ref 0.0–0.1)
BASOS PCT: 0.3 % (ref 0.0–3.0)
Eosinophils Absolute: 0.1 10*3/uL (ref 0.0–0.7)
Eosinophils Relative: 0.9 % (ref 0.0–5.0)
HEMATOCRIT: 42.8 % (ref 36.0–46.0)
HEMOGLOBIN: 14.7 g/dL (ref 12.0–15.0)
LYMPHS PCT: 14.2 % (ref 12.0–46.0)
Lymphs Abs: 1.3 10*3/uL (ref 0.7–4.0)
MCHC: 34.3 g/dL (ref 30.0–36.0)
MCV: 90.6 fl (ref 78.0–100.0)
MONO ABS: 0.6 10*3/uL (ref 0.1–1.0)
Monocytes Relative: 7 % (ref 3.0–12.0)
Neutro Abs: 7 10*3/uL (ref 1.4–7.7)
Neutrophils Relative %: 77.6 % — ABNORMAL HIGH (ref 43.0–77.0)
Platelets: 242 10*3/uL (ref 150.0–400.0)
RBC: 4.73 Mil/uL (ref 3.87–5.11)
RDW: 12.4 % (ref 11.5–15.5)
WBC: 9.1 10*3/uL (ref 4.0–10.5)

## 2015-06-07 LAB — COMPREHENSIVE METABOLIC PANEL
ALBUMIN: 4.4 g/dL (ref 3.5–5.2)
ALT: 18 U/L (ref 0–35)
AST: 14 U/L (ref 0–37)
Alkaline Phosphatase: 38 U/L — ABNORMAL LOW (ref 39–117)
BILIRUBIN TOTAL: 0.7 mg/dL (ref 0.2–1.2)
BUN: 14 mg/dL (ref 6–23)
CALCIUM: 9.3 mg/dL (ref 8.4–10.5)
CO2: 25 mEq/L (ref 19–32)
CREATININE: 0.84 mg/dL (ref 0.40–1.20)
Chloride: 107 mEq/L (ref 96–112)
GFR: 86.65 mL/min (ref >60.00)
Glucose, Bld: 97 mg/dL (ref 70–99)
Potassium: 3.8 mEq/L (ref 3.5–5.1)
SODIUM: 140 meq/L (ref 135–145)
TOTAL PROTEIN: 6.6 g/dL (ref 6.0–8.3)

## 2015-06-07 LAB — VITAMIN D 25 HYDROXY (VIT D DEFICIENCY, FRACTURES): VITD: 15.22 ng/mL — ABNORMAL LOW (ref 30.00–100.00)

## 2015-06-07 NOTE — Patient Instructions (Addendum)
1.  We will check another CBC with differential, CMP and vitamin D level. 2.  Please review the information on the MS medications.  My choice would be the Tecifera or Gilenya or Tysabri.  Please review info provided (as well as info of both medications provided below).  Please contact me with your choice and we can get started.  3.  Follow up in about 6 months after initiating medication.  Multiple Sclerosis Multiple sclerosis (MS) is a disease of the central nervous system. It leads to the loss of the insulating covering of the nerves (myelin sheath) of your brain. When this happens, brain signals do not get sent properly or may not get sent at all. The age of onset of MS varies.  CAUSES The cause of MS is unknown. However, it is more common in the [place] than in the [place]. RISK FACTORS There is a higher number of women with MS than men. MS is not an illness that is passed down to you from your family members (inherited). However, your risk of MS is higher if you have a relative with MS. SIGNS AND SYMPTOMS  The symptoms of MS occur in episodes or attacks. These attacks may last weeks to months. There may be long periods of almost no symptoms between attacks. The symptoms of MS vary. This is because of the many different ways it affects the central nervous system. The main symptoms of MS include:  Vision problems and eye pain.  Numbness.  Weakness.  Inability to move your arms, hands, feet, or legs (paralysis).  Balance problems.  Tremors. DIAGNOSIS  Your health care provider can diagnose MS with the help of imaging exams and lab tests. These may include specialized X-ray exams and spinal fluid tests. The best imaging exam to confirm a diagnosis of MS is an MRI. TREATMENT  There is no known cure for MS, but there are medicines that can decrease the number and frequency of attacks. Steroids are often used for short-term relief. Physical and occupational  therapy may also help. There are also many new alternative or complementary treatments available to help control the symptoms of MS. Ask your health care provider if any of these other options are right for you. HOME CARE INSTRUCTIONS   Take medicines as directed by your health care provider.  Exercise as directed by your health care provider. SEEK MEDICAL CARE IF: You begin to feel depressed. SEEK IMMEDIATE MEDICAL CARE IF:  You develop paralysis.  You have problems with bladder, bowel, or sexual function.  You develop mental changes, such as forgetfulness or mood swings.  You have a period of uncontrolled movements (seizure).   This information is not intended to replace advice given to you by your health care provider. Make sure you discuss any questions you have with your health care provider.   Document Released: 12/31/1999 Document Revised: 01/07/2013 Document Reviewed: 09/09/2012 Elsevier Interactive Patient Education [employer].  Two medications I recommend: 1.  Dimethyl Fumarate oral delayed-release capsules (TECFIDERA) What is this medicine? DIMETHYL FUMARATE (dye meth il fue ma rate) helps to decrease the number of multiple sclerosis relapses in people with relapsing-remitting forms of the disease. The medicine does not cure multiple sclerosis This medicine may be used for other purposes; ask your health care provider or pharmacist if you have questions. What should I tell my health care provider before I take this medicine? They need to know if you have any of these conditions: -immune  system problems -infection -an unusual or allergic reaction to dimethyl fumarate, other medicines, foods, dyes, or preservatives -pregnant or trying to get pregnant -breast-feeding How should I use this medicine? Take this medicine by mouth with a glass of water. Follow the directions on the prescription label. Do not cut, crush, or chew this medicine. You can take it with or  without food. If it upsets your stomach, take it with food. Take your medicine at regular intervals. Do not take your medicine more often than directed. Do not stop taking except on your doctor's advice. Talk to your pediatrician regarding the use of this medicine in children. Special care may be needed. Overdosage: If you think you have taken too much of this medicine contact a poison control center or emergency room at once. NOTE: This medicine is only for you. Do not share this medicine with others. What if I miss a dose? If you miss a dose, take it as soon as you can. If it is almost time for your next dose, take only that dose. Do not take double or extra doses. What may interact with this medicine? Interactions are not expected. This list may not describe all possible interactions. Give your health care provider a list of all the medicines, herbs, non-prescription drugs, or dietary supplements you use. Also tell them if you smoke, drink alcohol, or use illegal drugs. Some items may interact with your medicine. What should I watch for while using this medicine? Tell your doctor or healthcare professional if your symptoms do not start to get better or of they get worse. You may need blood work done while you are taking this medicine. What side effects may I notice from receiving this medicine? Side effects that you should report to your doctor or health care professional as soon as possible: -allergic reactions like skin rash, itching or hives, swelling of the face, lips, or tongue -fever or chills, sore throat -signs and symptoms of a serious brain infection like changes in vision, confusion, weakness on one side of the body, loss of balance or coordination, loss of memory, or changes in personality Side effects that usually do not require medical attention (Report these to your doctor or health care professional if they continue or are bothersome.): -diarrhea -facial flushing -nausea,  vomiting -stomach pain -upset stomach This list may not describe all possible side effects. Call your doctor for medical advice about side effects. You may report side effects to FDA at 1-800-FDA-1088. Where should I keep my medicine? Keep out of the reach of children. Store at room temperature between 15 and 30 degrees C (59 and 86 degrees F). Keep this medicine in the original container. Throw away any unused medicine after 90 days of opening the container. NOTE: This sheet is a summary. It may not cover all possible information. If you have questions about this medicine, talk to your doctor, pharmacist, or health care provider.    2016, Elsevier/Gold Standard. (2012-12-10 13:56:23)  2.  Fingolimod oral capsules (GILENYA) What is this medicine? FINGOLIMOD (fin GOL i mod) may help prevent relapses of multiple sclerosis. This medicine is not a cure. This medicine may be used for other purposes; ask your health care provider or pharmacist if you have questions. What should I tell my health care provider before I take this medicine? They need to know if you have any of these conditions: -diabetes -heart disease -high blood pressure -history of irregular heartbeat -history of stroke -immune system problems -infection (especially a  virus infection such as chickenpox, cold sores, or herpes) -liver disease -low blood counts, like low white cell, platelet, or red cell counts -lung or breathing disease, like asthma -uveitis -an unusual or allergic reaction to fingolimod, other medicines, foods, dyes, or preservatives -pregnant or trying to get pregnant -breast-feeding How should I use this medicine? Take this medicine by mouth with a glass of water. Follow the directions on the prescription label. You can take it with or without food. If it upsets your stomach, take it with food. Take your medicine at regular intervals. Do not take it more often than directed. Do not stop taking except on  your doctor's advice. A special MedGuide will be given to you by the pharmacist with each prescription and refill. Be sure to read this information carefully each time. Talk to your pediatrician regarding the use of this medicine in children. Special care may be needed. Overdosage: If you think you have taken too much of this medicine contact a poison control center or emergency room at once. NOTE: This medicine is only for you. Do not share this medicine with others. What if I miss a dose? It is important not to miss any doses. If you miss a dose, take it as soon as you can. If it is almost time for your next dose, take only that dose. Do not take double or extra doses. What may interact with this medicine? Do not take this medicine with any of the following medications: -arsenic trioxide -certain antipsychotics like pimozide, thioridazine, ziprasidone -certain medicines for irregular heart beat like amiodarone, disopyramide, dofetilide, ibutilide, procainamide, propafenone, quinidine, sotalol -certain medicines used for nausea like chlorpromazine, droperidol -certain medicines used to treat infections like chloroquine, clarithromycin, erythromycin, pentamidine -cisapride -dextromethorphan; quinidine -dronedarone -methadone -posaconazole -saquinavir This medicine may also interact with the following medications: -beta-blockers like metoprolol and propranolol -citalopram -digoxin -diltiazem -haloperidol -ketoconazole -live virus vaccines -medicines that lower your chance of fighting infection -mitoxantrone -natalizumab -verapamil This list may not describe all possible interactions. Give your health care provider a list of all the medicines, herbs, non-prescription drugs, or dietary supplements you use. Also tell them if you smoke, drink alcohol, or use illegal drugs. Some items may interact with your medicine. What should I watch for while using this medicine? Tell your doctor or  healthcare professional if your symptoms do not start to get better or if they get worse. After the first dose, you will be watched for at least 6 hours. If you miss more than 1 dose within the first 2 weeks of treatment, if you do not take it for more than 7 days during weeks 3 or 4 of treatment, or if you do not take this medicine for at least 2 weeks after taking it for at least a month, do not start taking it again without talking with your doctor. You will need to be watched again for at least 6 hours after the first dose. This medicine may stay in your body for up to 2 months after your last dose. Tell your doctor about any unusual symptoms. If you're a woman, do not get pregnant for at least 2 months after your last dose. If you do get pregnant, tell your doctor. Tell your doctor or health care professional right away if you have any change in your eyesight. Talk with your doctor if you have not had chickenpox or the vaccine for chickenpox. Your vision and blood may be tested before and during use of this medicine.  What side effects may I notice from receiving this medicine? Side effects that you should report to your doctor or health care professional as soon as possible: -allergic reactions like skin rash, itching or hives, swelling of the face, lips, or tongue -breathing problems -changes in vision -chest pain or palpitations -dark urine -dizziness or fainting -feeling faint or lightheaded, falls -fever or chills, sore throat -general ill feeling or flu-like symptoms -light-colored stools -loss of appetite -nausea -right upper belly pain -unusually slow heartbeat -unusually weak or tired -yellowing of the eyes or skin Side effects that usually do not require medical attention (Report these to your doctor or health care professional if they continue or are bothersome.): -back pain -cough -diarrhea -headache This list may not describe all possible side effects. Call your doctor  for medical advice about side effects. You may report side effects to FDA at 1-800-FDA-1088. Where should I keep my medicine? Keep out of the reach of children. Store at room temperature between 15 and 30 degrees C (59 and 86 degrees F). Throw away any unused medicine after the expiration date. NOTE: This sheet is a summary. It may not cover all possible information. If you have questions about this medicine, talk to your doctor, pharmacist, or health care provider.    2016, Elsevier/Gold Standard. (2011-07-27 17:25:50)  3.  Natalizumab injection (TYSABRI) What is this medicine? NATALIZUMAB (na ta LIZ you mab) is used to treat relapsing multiple sclerosis. This drug is not a cure. It is also used to treat Crohn's disease. This medicine may be used for other purposes; ask your health care provider or pharmacist if you have questions. What should I tell my health care provider before I take this medicine? They need to know if you have any of these conditions: -immune system problems -progressive multifocal leukoencephalopathy (PML) -an unusual or allergic reaction to natalizumab, other medicines, foods, dyes, or preservatives -pregnant or trying to get pregnant -breast-feeding How should I use this medicine? This medicine is for infusion into a vein. It is given by a health care professional in a hospital or clinic setting. A special MedGuide will be given to you by the pharmacist with each prescription and refill. Be sure to read this information carefully each time. Talk to your pediatrician regarding the use of this medicine in children. This medicine is not approved for use in children. Overdosage: If you think you have taken too much of this medicine contact a poison control center or emergency room at once. NOTE: This medicine is only for you. Do not share this medicine with others. What if I miss a dose? It is important not to miss your dose. Call your doctor or health care  professional if you are unable to keep an appointment. What may interact with this medicine? -azathioprine -cyclosporine -interferon -6-mercaptopurine -methotrexate -steroid medicines like prednisone or cortisone -TNF-alpha inhibitors like adalimumab, etanercept, and infliximab -vaccines This list may not describe all possible interactions. Give your health care provider a list of all the medicines, herbs, non-prescription drugs, or dietary supplements you use. Also tell them if you smoke, drink alcohol, or use illegal drugs. Some items may interact with your medicine. What should I watch for while using this medicine? Your condition will be monitored carefully while you are receiving this medicine. Visit your doctor for regular check ups. Tell your doctor or healthcare professional if your symptoms do not start to get better or if they get worse. Stay away from people who are sick. Call  your doctor or health care professional for advice if you get a fever, chills or sore throat, or other symptoms of a cold or flu. Do not treat yourself. In some patients, this medicine may cause a serious brain infection that may cause death. If you have any problems seeing, thinking, speaking, walking, or standing, tell your doctor right away. If you cannot reach your doctor, get urgent medical care. What side effects may I notice from receiving this medicine? Side effects that you should report to your doctor or health care professional as soon as possible: -allergic reactions like skin rash, itching or hives, swelling of the face, lips, or tongue -breathing problems -changes in vision -chest pain -dark urine -depression, feelings of sadness -dizziness -general ill feeling or flu-like symptoms -irregular, missed, or painful menstrual periods -light-colored stools -loss of appetite, nausea -muscle weakness -problems with balance, talking, or walking -right upper belly pain -unusually weak or  tired -yellowing of the eyes or skin Side effects that usually do not require medical attention (report to your doctor or health care professional if they continue or are bothersome): -aches, pains -headache -stomach upset -tiredness This list may not describe all possible side effects. Call your doctor for medical advice about side effects. You may report side effects to FDA at 1-800-FDA-1088. Where should I keep my medicine? This drug is given in a hospital or clinic and will not be stored at home. NOTE: This sheet is a summary. It may not cover all possible information. If you have questions about this medicine, talk to your doctor, pharmacist, or health care provider.    2016, Elsevier/Gold Standard. (2008-02-22 13:33:21)

## 2015-06-07 NOTE — Progress Notes (Signed)
NEUROLOGY FOLLOW UP OFFICE NOTE  Amy Rivas [id number]  HISTORY OF PRESENT ILLNESS: Amy Rivas is a 25 year old right-handed female with hypertension, GERD, and anxiety whom I previously saw for migraines now presents for newly-diagnosed multiple sclerosis.  History obtained by patient and hospital notes.  Labs and MRI of brain and cervical spine from this month were personally reviewed.  She has a history of migraine.  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  MRI and MRV of head from 01/24/14 were unremarkable.  She was admitted to [facility] from 05/25/15 to 05/29/15 after presenting with 2 week history of headache which did not respond to prednisone taper.  On morning of hospital admission, she developed right sided weakness and left facial droop.  She had passed out.  MRI of brain with and without contrast showed non-enhancing white matter lesions in the periventricular and subcortical white matter, which were not seen on prior brain MRI from 01/24/14.  MRI of cervical spine revealed no cord lesions.  MRA of head showed either artefactual or mild beading of the distal cervical ICA bilaterally, which may possibly be fibromuscular dysplasia.  Lumbar puncture was performed, which revealed CSF cell count of 5, protein 28, glucose 76, 3 oligoclonal bands, and IgG index 3.3.  Serum sed rate was 2 and CRP was negative.  She underwent 5 day course of IV Solu-Medrol.  At end of treatment, her CBC at discharge showed WBC of 20.5.  BMP was unremarkable.    Since discharge, she has noted improvement in strength and gait, but deficits are still present.  She no longer needs an assisted device.  A few months ago, she reported blurred vision in the right eye for about 2 weeks.  There was no associated eye pain.  She saw the eye doctor and had a normal exam.  She has tachycardia, for which she  takes Lopressor.  She has history of elevated liver enzymes for unexplained reason.  On 04/15/13, alk phos was 44 with AST 70 and ALT 84.  On 04/16/13, the alk phos was 52 with AST 45 and ALT 90.  On 01/24/14, the alk phos was 72 with AST of 93 and LT of 336.  Last LFTs from 05/25/15 were normal with alk phos of 39 with AST of 26 and ALT 18.  PAST MEDICAL HISTORY: Past Medical History  Diagnosis Date  . GERD (gastroesophageal reflux disease)   . Anxiety   . Headache   . PSVT (paroxysmal supraventricular tachycardia) ([place])   . MS (multiple sclerosis) ([place]) 05/2015    MEDICATIONS: Current Outpatient Prescriptions on File Prior to Visit  Medication Sig Dispense Refill  . acetaminophen (TYLENOL) 500 MG tablet Take 1,000 mg by mouth every 6 (six) hours as needed for fever.    Marland Kitchen ibuprofen (ADVIL,MOTRIN) 200 MG tablet Take 200 mg by mouth every 6 (six) hours as needed.    . metoprolol tartrate (LOPRESSOR) 25 MG tablet Take 25 mg by mouth 2 (two) times daily.     No current facility-administered medications on file prior to visit.    ALLERGIES: Allergies  Allergen Reactions  . Albuterol Anaphylaxis  . Hydrocodone Anaphylaxis and Nausea And Vomiting  . Codeine Nausea And Vomiting  . Lortab [Hydrocodone-Acetaminophen] Nausea And Vomiting    Patient can tolerate acetaminophen solely  . Morphine And Related Swelling and Rash    FAMILY HISTORY: Family  History  Problem Relation Age of Onset  . Lung cancer Maternal Grandmother   . Heart disease Mother   . Diabetes Father   . Cancer Maternal Uncle   . Diabetes Maternal Grandmother   . Asthma Maternal Grandmother   . Diabetes Maternal Grandfather   . Diabetes Paternal Grandmother   . Diabetes Paternal Grandfather   . Breast cancer Maternal Grandmother     SOCIAL HISTORY: Social History   Social History  . Marital Status: Married    Spouse Name: N/A  . Number of Children: N/A  . Years of Education: N/A   Occupational History  . Not on  file.   Social History Main Topics  . Smoking status: Never Smoker   . Smokeless tobacco: Never Used  . Alcohol Use: No  . Drug Use: No  . Sexual Activity:    Partners: Male    Birth Control/ Protection: Other-see comments   Other Topics Concern  . Not on file   Social History Narrative   ** Merged History Encounter **        REVIEW OF SYSTEMS: Constitutional: No fevers, chills, or sweats, no generalized fatigue, change in appetite Eyes: No visual changes, double vision, eye pain Ear, nose and throat: No hearing loss, ear pain, nasal congestion, sore throat Cardiovascular: No chest pain, palpitations Respiratory:  No shortness of breath at rest or with exertion, wheezes GastrointestinaI: No nausea, vomiting, diarrhea, abdominal pain, fecal incontinence Genitourinary:  No dysuria, urinary retention or frequency Musculoskeletal:  No neck pain, back pain Integumentary: No rash, pruritus, skin lesions Neurological: as above Psychiatric: No depression, insomnia, anxiety Endocrine: No palpitations, fatigue, diaphoresis, mood swings, change in appetite, change in weight, increased thirst Hematologic/Lymphatic:  No purpura, petechiae. Allergic/Immunologic: no itchy/runny eyes, nasal congestion, recent allergic reactions, rashes  PHYSICAL EXAM: Filed Vitals:   06/07/15 1249  BP: 100/78  Pulse: 111   General: No acute distress.  Patient appears well-groomed.  normal body habitus. Head:  Normocephalic/atraumatic Eyes:  Fundi examined but not visualized Neck: supple, no paraspinal tenderness, full range of motion Heart:  Regular rate and rhythm Lungs:  Clear to auscultation bilaterally Back: No paraspinal tenderness Neurological Exam: alert and oriented to person, place, and time. Attention span and concentration intact, recent and remote memory intact, fund of knowledge intact.  Speech fluent and not dysarthric, language intact.  CN II-XII intact. Bulk and tone normal, muscle  strength 5-/5 in right upper and lower extremities, otherwise 5/5 throughout.  Sensation to light touch, temperature and vibration intact.  Deep tendon reflexes 2+ throughout, toes downgoing.  Finger to nose and heel to shin testing intact.  Slight right hemiplegic gait with Timed 25 foot walk speed of 6.17 seconds, Romberg negative.  IMPRESSION: Relapsing-remitting multiple sclerosis  PLAN: 1.  Discussed disease-modifying agents, such as Gilenya, Tecfidera and Tysabri.  Her LFTs will have to be closely monitored.  She will contact us with her choice.  While bradycardia and heart block is an issue with Gilenya, I am not sure about tachycardia.  Will find out.  Information provided about the medications.  She will contact us this week with her choice. 2.  Check MRI of thoracic spine with and without contrast 3.  Check CBC with diff, LFTs, vitamin D, Lyme (she reports bitten by a tick), ANA, SSA/SSB antibodies and ANCA 4.  Once she starts a disease-modifying agent, will have her follow up in 6 months.  Metta Clines, DO  CC:  Eloise Levels, MD

## 2015-06-07 NOTE — Progress Notes (Signed)
Chart forwarded.  

## 2015-06-08 ENCOUNTER — Encounter: Payer: Self-pay | Admitting: Occupational Therapy

## 2015-06-08 ENCOUNTER — Ambulatory Visit: Payer: Medicaid Other | Admitting: Occupational Therapy

## 2015-06-08 ENCOUNTER — Telehealth: Payer: Self-pay

## 2015-06-08 ENCOUNTER — Ambulatory Visit: Payer: Medicaid Other | Attending: Internal Medicine | Admitting: Physical Therapy

## 2015-06-08 DIAGNOSIS — M6281 Muscle weakness (generalized): Secondary | ICD-10-CM

## 2015-06-08 DIAGNOSIS — R2681 Unsteadiness on feet: Secondary | ICD-10-CM | POA: Diagnosis present

## 2015-06-08 DIAGNOSIS — G8191 Hemiplegia, unspecified affecting right dominant side: Secondary | ICD-10-CM | POA: Diagnosis present

## 2015-06-08 DIAGNOSIS — R2689 Other abnormalities of gait and mobility: Secondary | ICD-10-CM

## 2015-06-08 LAB — SJOGREN'S SYNDROME ANTIBODS(SSA + SSB)
SSA (RO) (ENA) ANTIBODY, IGG: NEGATIVE
SSB (LA) (ENA) ANTIBODY, IGG: NEGATIVE

## 2015-06-08 LAB — ANCA SCREEN W REFLEX TITER: ANCA SCREEN: NEGATIVE

## 2015-06-08 LAB — ANA: ANA: NEGATIVE

## 2015-06-08 LAB — LYME AB/WESTERN BLOT REFLEX: B burgdorferi Ab IgG+IgM: <0.9 Index (ref <0.90)

## 2015-06-08 NOTE — Telephone Encounter (Signed)
Pt called would like to start Tecfidera. Form placed in provider's box for signature. Pt will stop by today or tomorrow to sign her portion.

## 2015-06-08 NOTE — Telephone Encounter (Signed)
-----   Message from Pieter Partridge, DO sent at 06/07/2015  7:13 PM EDT ----- Regarding: FW: Vitamin D level is low.  I would like her to start D3 4000 IU daily ----- Message -----    From: [facility] Interface    Sent: 06/07/2015   4:35 PM      To: Pieter Partridge, DO

## 2015-06-08 NOTE — Therapy (Addendum)
[facility] [address] [place], [place], [address] Phone: [phone]   Fax:  [phone]  Physical Therapy Evaluation  Patient Details  Name: Amy Rivas MRN: [id number] Date of Birth: [birth year] Referring Provider: Tomi Likens  Encounter Date: 06/08/2015      PT End of Session - 06/08/15 1202    Visit Number 1   Number of Visits 17  Requesting 16 Medicaid visits   Date for PT Re-Evaluation 08/07/15   Authorization Type Medicaid   Equipment Utilized During Treatment Gait belt   Activity Tolerance Patient limited by fatigue   Behavior During Therapy [facility] for tasks assessed/performed      Past Medical History  Diagnosis Date  . GERD (gastroesophageal reflux disease)   . Anxiety   . Headache   . PSVT (paroxysmal supraventricular tachycardia) ([place])   . MS (multiple sclerosis) ([place]) 05/2015    Past Surgical History  Procedure Laterality Date  . Tympanostomy tube placement  1991, 1993, 2003  . Tonsillectomy and adenoidectomy  1992  . Laparoscopic cholecystectomy  2008  . Cesarean section  2010, 2012    There were no vitals filed for this visit.       Subjective Assessment - 06/08/15 0933    Subjective Pt is a 25 year old female who presents to OP PT following hospitalization in early May 2017 for migraines, dizziness, and R sided weakness.  She was using a walker up until this weekend, feels R side is still weak.  Stairs are difficult-in her split level home, pt requires husband assistance for stair negotiation. With fatigue, she feels like R knee will give way and she will fall.    Pertinent History MS diagnosis may 2017   Patient Stated Goals Pt's goal for therapy are for strengthening R side and to improve balance and do stairs independently.   Currently in Pain? Yes   Pain Score 3    Pain Location Neck   Pain Orientation Anterior   Pain Descriptors / Indicators Aching;Dull   Pain Type Chronic pain    Pain Onset More than a month ago   Pain Frequency Intermittent   Aggravating Factors  loud sounds   Pain Relieving Factors OTC medications            [facility] PT Assessment - 06/08/15 [id number]    Assessment   Medical Diagnosis MS   Referring Provider Tomi Likens   Onset Date/Surgical Date 05/25/15  hospitalized   Precautions   Precautions Fall;Other (comment)   Precaution Comments No driving   Balance Screen   Has the patient fallen in the past 6 months No   Has the patient had a decrease in activity level because of a fear of falling?  Yes   Is the patient reluctant to leave their home because of a fear of falling?  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   Leisure crafting hobbies, scrap book, reading  Enjoys yoga, walking in neighborhood   Observation/Other Assessments  Focus on Therapeutic Outcomes (FOTO)  NA-Staff did not capture   Sensation   Light Touch Appears Intact   Additional Comments Reports some changes in sensation on bottom of R foot   Tone   Assessment Location Right Lower Extremity   ROM / Strength   AROM / PROM / Strength Strength   Strength   Overall Strength Deficits   Strength Assessment Site Hip;Knee;Ankle   Right/Left Hip Right;Left   Right Hip Flexion 3+/5   Left Hip Flexion 5/5   Right/Left Knee Right;Left   Right Knee Flexion 3-/5   Right Knee Extension 3-/5   Left Knee Flexion 5/5   Left Knee Extension 5/5   Right/Left Ankle Right;Left   Right Ankle Dorsiflexion 3-/5   Left Ankle  Dorsiflexion 5/5   Transfers   Transfers Sit to Stand;Stand to Sit   Sit to Stand 6: Modified independent (Device/Increase time);With upper extremity assist;From chair/3-in-1   Stand to Sit 6: Modified independent (Device/Increase time);With upper extremity assist;To chair/3-in-1   Ambulation/Gait   Ambulation/Gait Yes   Ambulation/Gait Assistance 5: Supervision;4: Min assist  Min assist as RLE fatigues   Ambulation/Gait Assistance Details Pt reports she hasn't walked this long of a distance since d/c from hospital.  Pt has decreased weightshift to RLE, with circumduction at times on RLE and excessive inversion/eversion (lateral instability) on RLE with stance phase of gait.  Last 60 ft of gait, PT provides min assist/HHA for safety.   Ambulation Distance (Feet) 130 Feet   Assistive device None;Other (Comment)  HHA last 60 ft of gait   Gait Pattern Step-through pattern;Decreased arm swing - right;Decreased step length - right;Decreased stance time - right;Decreased dorsiflexion - right;Decreased weight shift to right;Right flexed knee in stance;Poor foot clearance - right;Right circumduction  excess lateral instability in R foot with stance   Ambulation Surface Level;Indoor   Gait velocity 26.35 sec= 1.24 ft/sec   Standardized Balance Assessment   Standardized Balance Assessment Berg Balance Test;Dynamic Gait Index;Timed Up and Go Test   Berg Balance Test   Sit to Stand Able to stand  independently using hands   Standing Unsupported Able to stand 2 minutes with supervision  excess trunk sway   Sitting with Back Unsupported but Feet Supported on Floor or Stool Able to sit safely and securely 2 minutes   Stand to Sit Controls descent by using hands   Transfers Able to transfer safely, definite need of hands   Standing Unsupported with Eyes Closed Able to stand 10 seconds with supervision   Standing Ubsupported with Feet Together Able to place feet together independently but unable to hold  for 30 seconds   From Standing, Reach Forward with Outstretched Arm Reaches forward but needs supervision   From Standing Position, Pick up Object from Floor Able to pick up shoe, needs supervision   From Standing Position, Turn to Look Behind Over each Shoulder Needs supervision when turning   Turn 360 Degrees Needs assistance while turning   Standing Unsupported, Alternately Place Feet on Step/Stool Needs assistance to keep from falling or unable to try   Standing Unsupported, One Foot in [employer] balance while stepping or standing   Standing on One Leg Able to lift leg independently and hold 5-10 seconds  LLE>5 sec; unable on R   Total Score 29   Berg comment: Scores <45/56 are indicative of increased fall risk. Scores <37/56 indicate need for RW for safety with gait-patient advised as such.   Timed Up and Go  Test   TUG Normal TUG   Normal TUG (seconds) 16.08   RLE Tone   RLE Tone Hypotonic  No increase in tone noted with P/ROM in RLE                             PT Short Term Goals - 06/09/15 0858    PT SHORT TERM GOAL #1   Title Pt will be independent with HEP for strengthening, balance and gait.  Target 07/09/15   Baseline Baseline:  No current HEP   Time 4   Period Weeks   Status New   PT SHORT TERM GOAL #2   Title Pt will perform at least 6 of 10 reps of sit<>stand transfers with minimal to no UE support, for improved efficiency and safety with transfers.   Baseline Requires UE support, L>R for sit<>stand   Time 4   Period Weeks   Status New   PT SHORT TERM GOAL #3   Title Pt will improve TUG score to less than or equal to 13.5 seconds for decreased fall risk.   Baseline TUG:  16.08 sec (Scores >13.5 sec indicate increased fall risk.)   Time 4   Period Weeks   Status New   PT SHORT TERM GOAL #4   Title Pt will improve Berg Balance score to at least 34/56 for decreased fall risk.   Baseline Berg score 29/56 (<45/56 indicates increased fall risk.)    Time 4   Period Weeks   Status New   PT SHORT TERM GOAL #5   Title Pt will ambulate with least restrictive assistive device with supervision, at least 300 ft, for improved gait efficiency and safety.   Baseline Gait 150 ft with HHA/min asisst   Time 4   Period Weeks   Status New           PT Long Term Goals - 06/09/15 0909    PT LONG TERM GOAL #1   Title Pt will verbalize understanding of fall prevention within the home environment.  TARGET 09/08/15   Baseline at fall risk per Berg, TUG, gait velocity scores   Time 12   Period Weeks   Status New   PT LONG TERM GOAL #2   Title Pt will perform at least 8 of 10 reps of sit<>stand transfers with no UE support, independently for improved efficiency and safety with transfers.   Baseline Requires UE support for sit<>stand.   Time 12   Period Weeks   Status New   PT LONG TERM GOAL #3   Title Pt will improve Berg Balance score to at least 38/56 for decreased fall risk.   Baseline Merrilee Jansky 29/56   Time 12   Period Weeks   Status New   PT LONG TERM GOAL #4   Title Pt will improve gait velocity to at least 1.8 ft/sec for improved gait efficiency and safety.   Baseline gait velocity 1.24 ft/sec (<1.8 ft/sec indicates increased fall risk.)   Time 12   Period Weeks   Status New   PT LONG TERM GOAL #5   Title Pt will verbalize understanding of optimal/community fitness options upon D/C from PT.   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  Pt was hospitalized from 05/25/15-05/29/15 due to R sided weakness and dizziness. She has history of GERD, migraines, anxiety, paroxysmal supraventricular tachycardia, recent diagnosis of MS.  She presents with decreased muscle strength, abnormal  muscle tone, decreased balance, decreased gait independence and safety,  abnormal gait, impaired sensation.  Per TUG score of 16.08 sec, gait velocity of 1.24 ft/sec, and Berg score of 29/56, pt is at high fall risk.  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   Rehab Potential Good   PT Frequency --  16 visits   PT Duration --  over 3 month period   PT Treatment/Interventions ADLs/Self Care Home Management;Therapeutic exercise;Therapeutic activities;Functional mobility training;Stair training;DME Instruction;Gait training;Balance training;Neuromuscular re-education;Patient/family education;Orthotic Fit/Training   PT Next Visit Plan Initiate HEP-strengthening and balance; gait training with RW; trial of orthotics   Consulted and Agree with Plan of Care Patient      Patient will benefit from skilled therapeutic intervention in order to improve the following deficits and impairments:  Abnormal gait, Decreased balance, Decreased mobility, Decreased safety awareness, Decreased strength, Difficulty walking, Impaired sensation, Decreased endurance, Impaired tone  Visit Diagnosis: Other abnormalities of gait and mobility - Plan: PT plan of care cert/re-cert, PT plan of care cert/re-cert  Muscle weakness (generalized) - Plan: PT plan of care cert/re-cert, PT plan of care cert/re-cert  Unsteadiness on feet - Plan: PT plan of care cert/re-cert, PT plan of care cert/re-cert     Problem List Patient Active Problem List   Diagnosis Date Noted  . Multiple sclerosis exacerbation ([place]) 05/25/2015  . Acute right-sided weakness 05/25/2015  . Intractable headache 05/25/2015  . Adjustment disorder with anxious mood   . Solitary pulmonary nodule 12/21/2013  . Dyspnea 12/18/2013  . Elevated LFTs 04/15/2013  . Vomiting  04/14/2013  . Abdominal pain 04/14/2013  . Sinus tachycardia ([place]) 04/14/2013  . Nipple discharge 11/21/2012    Frazier Butt. 06/09/2015, 9:14 AM  Frazier Butt., PT  [facility] [address] [place], [place], [address] Phone: [phone]   Fax:  [phone]  Name: GEANIE GLASBY MRN: [id number] Date of Birth: 1990-10-18  Mady Haagensen, PT 06/09/2015 9:15 AM Phone: [phone] Fax: [phone]

## 2015-06-08 NOTE — Telephone Encounter (Signed)
Okay.  I would like to repeat LFTs 3 months after initiating Tecfidera.  I would like to recheck CBC with diff and LFTs 6 months after initiating Tecfidera (prior to follow up)

## 2015-06-08 NOTE — Patient Instructions (Signed)
Pt provided with folder regarding MS Society and several resources

## 2015-06-08 NOTE — Telephone Encounter (Signed)
Pt came and signed her portion on Tecfidera paperwork. Will fax off after provider's signature. Did set reminders for 3 and 6 month labs.

## 2015-06-08 NOTE — Addendum Note (Signed)
Addended by: Frazier Butt on: 06/08/2015 02:09 PM   Modules accepted: Orders

## 2015-06-08 NOTE — Therapy (Signed)
[facility] [address] [place], [place], [address] Phone: [phone]   Fax:  [phone]  Occupational Therapy Evaluation  Patient Details  Name: Amy Rivas MRN: [id number] Date of Birth: 1991/01/11 Referring Provider: Dr. Vernell Leep  Encounter Date: 06/08/2015      OT End of Session - 06/08/15 1344    Visit Number 1   Number of Visits 9  eval plus 8 visits   Date for OT Re-Evaluation 07/13/15  to allow for medicaid approval   Authorization Type medicaid   OT Start Time 0845   OT Stop Time [phone]   OT Time Calculation (min) 41 min   Activity Tolerance Patient tolerated treatment well      Past Medical History  Diagnosis Date  . GERD (gastroesophageal reflux disease)   . Anxiety   . Headache   . PSVT (paroxysmal supraventricular tachycardia) ([place])   . MS (multiple sclerosis) ([place]) 05/2015    Past Surgical History  Procedure Laterality Date  . Tympanostomy tube placement  1991, 1993, 2003  . Tonsillectomy and adenoidectomy  1992  . Laparoscopic cholecystectomy  2008  . Cesarean section  2010, 2012    There were no vitals filed for this visit.      Subjective Assessment - 06/08/15 0849    Subjective  My arm is weak and I can't walk right   Pertinent History see epic   Patient Stated Goals to get my balance better and get my arm stronger   Currently in Pain? Yes   Pain Score 3    Pain Location Neck   Pain Descriptors / Indicators Dull;Aching  like a dull headache   Pain Type Chronic pain   Pain Onset More than a month ago   Pain Frequency Intermittent   Aggravating Factors  loud sounds,    Pain Relieving Factors OTC meds   Multiple Pain Sites No           [facility] OT Assessment - 06/08/15 0001    Assessment   Diagnosis Relaspsing remitting MS   Referring Provider Dr. Vernell Leep   Onset Date 05/25/15   Prior Therapy Had PT, OT and ST in acute care setting only   Precautions    Precautions Other (comment);Fall   Precaution Comments No driving   Restrictions   Weight Bearing Restrictions No   Balance Screen   Has the patient fallen in the past 6 months Yes  pt for PT eval today   How many times? 1  got really dizzy and passed out day of admission   Home  Environment   Family/patient expects to be discharged to: Private residence   [place] other  27 and 13 year old children   Available Help at Discharge Available PRN/intermittently   Type of [place] Two level  split level house - husband assisting   Development worker, community   Additional Comments Pt has transfer tub bench. 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   Leisure crafting hobbies, scrap book, reading   ADL   Eating/Feeding Independent   Grooming Independent   Upper Body Bathing Modified independent   Lower Body Bathing Modified independent   Upper Body Dressing Increased time  "my arm gets tired"   Lower Body Dressing Increased time   [place]  Toileting - Comptroller Minimal assistance   IADL   Shopping Needs to be accompanied on any shopping trip   Light Housekeeping Performs light daily tasks such as dishwashing, bed making   Meal Prep Plans, prepares and serves adequate meals independently   [place] on family or friends for transportation   Medication Management Is responsible for taking medication in correct dosages at correct time   Physiological scientist financial matters independently (budgets, writes checks, pays rent, bills goes to bank), collects and keeps track of income   Mobility   Mobility Status Needs assist   Mobility Status Comments supervision in community for balance   Written Expression    Dominant Hand Right   Handwriting 100% legible  for name - reports handwriting looks the same   Vision - History   Baseline Vision Wears glasses all the time   Additional Comments Pt had episode of blurry vision in R eye for about a week prior to hospitalization however this has resolved.  lasted about one week.   Vision Assessment   Eye Alignment Within Functional Limits   Ocular Range of Motion Within Functional Limits   Tracking/Visual Pursuits Able to track stimulus in all quads without difficulty   Comment Pt denies visual changes and denies vertigo   Activity Tolerance   Activity Tolerance Tolerate 30+ min activity without fatigue   Cognition   Overall Cognitive Status Within Functional Limits for tasks assessed   Mini Mental State Exam  Pt denies any changes and no apparent deficits during assessment. Will monitor   Sensation   Light Touch Appears Intact   Hot/Cold Appears Intact   Proprioception Appears Intact   Additional Comments For RUE   Coordination   Gross Motor Movements are Fluid and Coordinated Yes   Fine Motor Movements are Fluid and Coordinated Yes   Finger Nose Finger Test WFL's for RUE   9 Hole Peg Test Right   Tone   Assessment Location Right Upper Extremity   ROM / Strength   AROM / PROM / Strength AROM;Strength   AROM   Overall AROM  Within functional limits for tasks performed   Overall AROM Comments BUE's    Strength   Overall Strength Deficits   Overall Strength Comments RUE drift;  3+/5 shoulder, elbow, 3/5 wrist flexion/extension.   Hand Function   Right Hand Gross Grasp Impaired   Right Hand Grip (lbs) 15   Left Hand Gross Grasp Functional   Left Hand Grip (lbs) 55        Tx:  Provided pt with folder with information about MS Society and as well as numerous other resources. Discussed issues of fatigue on performance, importance of monitoring and safety awareness.                    OT Short Term Goals - 06/08/15 1335    OT  SHORT TERM GOAL #1   Title n/a           OT Long Term Goals - 06/08/15 1335    OT LONG TERM GOAL #1   Title Pt will be mod I with HEP - 07/13/2015 (to allow time for medicaid approval)   Baseline dependent   Status New   OT LONG TERM GOAL #2   Title Pt will demonstrate ability to lift 3 pound object off mid level shelf x3 trials.   Baseline unable to  lift items   Status New   OT LONG TERM GOAL #3   Title Pt will demonstrate at least 10 pound increase in grip strength in R dominant hand to assist with functional tasks.   Baseline 15 pounds   Status New   OT LONG TERM GOAL #4   Title Pt will be able to carry grocery bag in RUE while walking 30 feet.   Baseline dependent   Status New               Plan - 06/08/15 1338    Clinical Impression Statement Pt is a 25 year female who presents today to the outpatient center with Right domnant hemiplegia secondary to MS. Pt was hospitalized from 05/25/2015 to 05/29/2015.  Pt was discharged home with her husband and her two young children.  Pt presents with the following deficits that impact ADL and IADL, as well as return to role as mother and homemaker:  Hemiplegia to R dominant side, decreased strength RUE, decreased grip strength RUE, decreased functional use of RUE, decreased balance, decreased activity tolerance, poor understanding of disease process.  Pt will benefit from skilled OT to address these deficts and maximize independence in ADL and IADL activties.    Rehab Potential Good   OT Frequency 2x / week   OT Duration 4 weeks  eval + 8 visits over 3 month period to allow for Medicaid approval   OT Treatment/Interventions Self-care/ADL training;Therapeutic exercise;Neuromuscular education;DME and/or AE instruction;Therapeutic activities;Patient/family education;Balance training   Plan initiate HEP   Consulted and Agree with Plan of Care Patient      Patient will benefit from skilled therapeutic intervention in order to improve  the following deficits and impairments:  Abnormal gait, Decreased activity tolerance, Decreased balance, Decreased strength, Impaired UE functional use, Pain  Visit Diagnosis: Hemiplegia, unspecified affecting right dominant side ([place]) - Plan: Ot plan of care cert/re-cert  Muscle weakness (generalized) - Plan: Ot plan of care cert/re-cert  Unsteadiness on feet - Plan: Ot plan of care cert/re-cert  Other abnormalities of gait and mobility - Plan: Ot plan of care cert/re-cert    Problem List Patient Active Problem List   Diagnosis Date Noted  . Multiple sclerosis exacerbation ([place]) 05/25/2015  . Acute right-sided weakness 05/25/2015  . Intractable headache 05/25/2015  . Adjustment disorder with anxious mood   . Solitary pulmonary nodule 12/21/2013  . Dyspnea 12/18/2013  . Elevated LFTs 04/15/2013  . Vomiting 04/14/2013  . Abdominal pain 04/14/2013  . Sinus tachycardia ([place]) 04/14/2013  . Nipple discharge 11/21/2012    Quay Burow, OTR/L 06/08/2015, 1:49 PM  [facility] [address] [place], [place], [address] Phone: [phone]   Fax:  [phone]  Name: TIMMYA DOSER MRN: [id number] Date of Birth: 01/13/1991

## 2015-06-09 NOTE — Addendum Note (Signed)
Addended by: Frazier Butt on: 06/09/2015 09:17 AM   Modules accepted: Orders

## 2015-06-16 ENCOUNTER — Ambulatory Visit
Admission: RE | Admit: 2015-06-16 | Discharge: 2015-06-16 | Disposition: A | Discharge status: Home / Self Care | Payer: Medicaid Other | Source: Ambulatory Visit | Attending: Neurology | Admitting: Neurology

## 2015-06-16 ENCOUNTER — Telehealth: Payer: Self-pay

## 2015-06-16 DIAGNOSIS — G35 Multiple sclerosis: Secondary | ICD-10-CM

## 2015-06-16 MED ORDER — GADOBENATE DIMEGLUMINE 529 MG/ML IV SOLN
8.0000 mL | Freq: Once | INTRAVENOUS | Status: AC | PRN
  Administered 2015-06-16: 8 mL via INTRAVENOUS

## 2015-06-16 NOTE — Telephone Encounter (Signed)
Sent via mychart

## 2015-06-16 NOTE — Telephone Encounter (Signed)
-----   Message from Pieter Partridge, DO sent at 06/16/2015  7:05 AM EDT ----- Lyme and other tests (looking for alternative diagnosis) are normal.

## 2015-06-16 NOTE — Telephone Encounter (Signed)
-----   Message from Pieter Partridge, DO sent at 06/16/2015 11:38 AM EDT ----- MRI of thoracic spinal cord is normal.

## 2015-06-16 NOTE — Telephone Encounter (Signed)
Message relayed to patient. Verbalized understanding and denied questions.   

## 2015-06-17 NOTE — Telephone Encounter (Signed)
Received fax from [facility]. Pt's tecfidera shipped from [facility].

## 2015-06-18 ENCOUNTER — Ambulatory Visit: Payer: Medicaid Other | Admitting: Occupational Therapy

## 2015-06-18 ENCOUNTER — Ambulatory Visit: Payer: Medicaid Other | Attending: Internal Medicine | Admitting: Physical Therapy

## 2015-06-18 ENCOUNTER — Telehealth: Payer: Self-pay | Admitting: Physical Therapy

## 2015-06-18 DIAGNOSIS — R2689 Other abnormalities of gait and mobility: Secondary | ICD-10-CM | POA: Insufficient documentation

## 2015-06-18 DIAGNOSIS — M6281 Muscle weakness (generalized): Secondary | ICD-10-CM | POA: Insufficient documentation

## 2015-06-18 DIAGNOSIS — G35 Multiple sclerosis: Secondary | ICD-10-CM

## 2015-06-18 NOTE — Patient Instructions (Signed)
Bridging    Slowly raise buttocks from floor, keeping stomach tight. Repeat __5__ times per set. Do _1-2__ sets per session. Do __1-2__ sessions per day.  http://orth.exer.us/1097   Copyright  VHI. All rights reserved.  Quad Sets    Squeeze buttocks and hold. Tighten top of both thighs, pushing your knees into the bed. Hold for _3__ seconds. Relax for __3_ seconds. Repeat __5_ times. Do _1-2__ times a day. Repeat with other leg.    Copyright  VHI. All rights reserved.  KNEE: Extension, Short Arc Quads - Supine    Place bolster or rolled towel under knees. Raise one leg until knee is straight. _5__ reps per set, __1-2_ sets per day.   Copyright  VHI. All rights reserved.  HIP / KNEE: Flexion, Heel Slides - Supine    Slide heel up toward buttocks, keeping leg in straight line. _5__ reps per set, _1-2__ sets per day. Use towel or pillowcase under heel as needed.  Copyright  VHI. All rights reserved.  Abduction    Slide one leg out to side. Keep kneecap pointing up. Gently bring leg back to the middle.  Repeat __5 __ times. Do _1-2 sets, 1-2___ sessions per day.  http://gt2.exer.us/374   Copyright  VHI. All rights reserved.  Ankle Bend (Dorsiflexion and Plantar Flexion)    Sitting or lying down, point toes up, keeping both heels on floor. Then press toes to floor, raising heels. Repeat _10___ times. Do __1-2_ sessions per day.  http://gt2.exer.us/404   Copyright  VHI. All rights reserved.  Functional Quadriceps: Sit to Stand    Sit on edge of chair, feet flat on floor. Lean forward, push through your hands from the chair and stand upright, extending knees fully. To sit back down, bend at your hips, reach back for the chair and squat slowly to sit.  Repeat __3-5__ times per set. Think about this technique each time you stand up.Energy Conservation Techniques  1. Sit for as many activities as possible. 2. Use slow, smooth movements.  Rushing increases  discomfort. 3. Determine the necessity of performing the task.  Simplify those tasks that are necessary.  (Get clothes out of the dryer when they are warm instead of ironing, let dishes air dry, etc.) 4. Take frequent rests both during and between activities.  Avoid repetitive tasks. 5. Pre-plan your activities; try a daily and/or weekly schedule.  Spread out the activities that are most fatiguing (break up cleaning tasks over multiple days). 6. Remember to plan a balance of work, rest and recreation. 7. Consider the best time for each activity.  Do the most exertive task when you have the most energy. 8. Don't carry items if you can push them.  Slide, don't lift. Push, don't pull. 9. Utilize two hands when appropriate. 10. Maintain good posture and use proper body mechanics.  Avoid remaining in one position for too long.  When lifting, bend at the knees, not at the waist.  Exhale when bending down, inhale when straightening up.  Carry objects as close to your body and as near to the center of the pelvis.  11. Avoid wasted body movements (position yourself for the task so that you avoid bending, twisting, etc.                when possible). 12. Select the best working environment.  Consider lighting, ventilation, clothing, and equipment. 24. Organize your storage areas, making the items you use daily convenient.  Store heaviest items at waist  height.  Store frequently used items between shoulders and knee height.  Consider leaving frequently used       items on countertops.  (You can organize in storage baskets based on time used/purpose). 14. Feelings and emotions can be real causes of fatigue.  Try to avoid unnecessary worry, irritation, or                    frustration.  Avoid stress, it can also be a source of fatigue. 15. Get help from other people for difficult tasks. 16. Explore equipment or items that may be able to do the job for you with greater ease.  (Electric can         openers, blenders, lightweight items for cleaning, etc.)  http://orth.exer.us/735   Copyright  VHI. All rights reserved.

## 2015-06-18 NOTE — Telephone Encounter (Signed)
Order placed. Staff message setn to Mady Haagensen, PT.

## 2015-06-18 NOTE — Therapy (Signed)
[facility] [address] [place], [place], [address] Phone: [phone]   Fax:  [phone]  Physical Therapy Treatment  Patient Details  Name: Amy Rivas MRN: [id number] Date of Birth: 1990-08-23 Referring Provider: Tomi Likens  Encounter Date: 06/18/2015      PT End of Session - 06/18/15 1206    Visit Number 2   Number of Visits 17  16 requested; granted 3 per Medicaid   Date for PT Re-Evaluation 08/07/15   Authorization Type Medicaid   Authorization Time Period 06/17/15-10/06/15   Authorization - Visit Number 1   Authorization - Number of Visits 3   PT Start Time 0803   PT Stop Time 0908   PT Time Calculation (min) 65 min   Activity Tolerance Patient tolerated treatment well   Behavior During Therapy [facility] for tasks assessed/performed      Past Medical History  Diagnosis Date  . GERD (gastroesophageal reflux disease)   . Anxiety   . Headache   . PSVT (paroxysmal supraventricular tachycardia) ([place])   . MS (multiple sclerosis) ([place]) 05/2015    Past Surgical History  Procedure Laterality Date  . Tympanostomy tube placement  1991, 1993, 2003  . Tonsillectomy and adenoidectomy  1992  . Laparoscopic cholecystectomy  2008  . Cesarean section  2010, 2012    There were no vitals filed for this visit.      Subjective Assessment - 06/18/15 0807    Subjective Will start MS medications next week on 06/24/15.   Feel like I have more control, but R knee still gets weak like it will give out.   Patient Stated Goals Pt's goal for therapy are for strengthening R side and to improve balance and do stairs independently.   Currently in Pain? No/denies                   Transfers as part of functional lower extremity strengthening for HEP.      [place] Adult PT Treatment/Exercise - 06/18/15 0813    Transfers   Transfers Sit to Stand;Stand to Sit   Sit to Stand 5: Supervision;With upper extremity assist;From  bed;From chair/3-in-1   Stand to Sit 5: Supervision;With upper extremity assist;To bed;To chair/3-in-1   Number of Reps Other reps (comment)  5 reps from mat; 3 reps from chair   Transfer Cueing Cues provided for proper sit<>stand technique, including hand placement, forward lean, and upright standing with equal weightbearing.   Ambulation/Gait   Ambulation/Gait Yes   Ambulation/Gait Assistance 4: Min guard;4: Min assist   Ambulation/Gait Assistance Details Trial of R foot-up brace x 100 ft using RW, with initial assistance with R foot clearance, but pt has inconsistent foot clearance, with continued foot drag and knee flexed in stance phase of gait on RLE.  Trial of R off-shelf custom molded AFO on RLE, with RW x 100 ft, pt ambulates with improved consistent R foot clearance, improved consistent heelstrike, extended knee during stance phase of gait on RLE, with improved stance time on RLE.  Pt continues to have shortened step length on LLE, with pt reporting decreased reliance on RUE on RW during gait due to RUE weakness.     Ambulation Distance (Feet) 80 Feet  x 2, then 100 ft x 2   Assistive device Rolling walker  trial of foot-up brace on RLE, trial of R AFO   Gait Pattern Step-through pattern;Decreased step length - right;Decreased step length - left;Decreased stance time - right;Decreased dorsiflexion -  right;Decreased weight shift to right;Right flexed knee in stance;Narrow base of support;Poor foot clearance - right  LLE in external rotation for widened BOS   Gait Comments Pt in agreement that trial of AFO assisted in improvement in overall gait, pattern and pt in agreement to proceed with order request from Dr. Tomi Likens for AFO consult/R AFO.   Exercises   Exercises Knee/Hip;Ankle   Knee/Hip Exercises: Supine   Quad Sets AROM;Both;5 reps   Short Arc [place];Right;Left;5 reps  AAROM RLE   Heel Slides AROM;Right;5 reps   Bridges AROM;Strengthening;Both;5 reps  + 2 reps with  therapist positioning RLE in weightbearing   Other Supine Knee/Hip Exercises HIp abduction 5 reps RLE, hooklying hip flexion/marching with therapist assist to prevent external rotation, hooklying hip abduction 5 reps bilaterally.   Ankle Exercises: Seated   Toe Raise 5 reps;3 seconds  2 sets     Attempted SLR x 2 reps, with pt having difficulty lifting RLE off of mat.   Frequent rest breaks provided during session to avoid over-fatigue of muscles.  PT provides cueing with each exercise for proper technique and benefit of each exercise to specific muscle groups.  Provided HEP handouts for use for home.      Self Care:  Discussed POC, in light of Medicaid's approval of only 3 PT visits and no OT visits.  Provided patient with information to contact insurance coordinator/supervisor of clinic in order to ask about possibility of self-pay beyond insurance-approved visits.  Discussed and provided patient with handout on energy conservation techniques.  Discussed trials of various AFOs and benefit of orthotic for improved stability with gait.      PT Education - 06/18/15 0930    Education provided Yes   Education Details HEP-see instructions, energy conservation for MS, trial of AFO/recommendation for AFO consult for R AFO   Person(s) Educated Patient   Methods Explanation;Demonstration;Handout   Comprehension Verbalized understanding;Returned demonstration          PT Short Term Goals - 06/09/15 0858    PT SHORT TERM GOAL #1   Title Pt will be independent with HEP for strengthening, balance and gait.  Target 07/09/15   Baseline Baseline:  No current HEP   Time 4   Period Weeks   Status New   PT SHORT TERM GOAL #2   Title Pt will perform at least 6 of 10 reps of sit<>stand transfers with minimal to no UE support, for improved efficiency and safety with transfers.   Baseline Requires UE support, L>R for sit<>stand   Time 4   Period Weeks   Status New   PT SHORT TERM GOAL #3   Title  Pt will improve TUG score to less than or equal to 13.5 seconds for decreased fall risk.   Baseline TUG:  16.08 sec (Scores >13.5 sec indicate increased fall risk.)   Time 4   Period Weeks   Status New   PT SHORT TERM GOAL #4   Title Pt will improve Berg Balance score to at least 34/56 for decreased fall risk.   Baseline Berg score 29/56 (<45/56 indicates increased fall risk.)   Time 4   Period Weeks   Status New   PT SHORT TERM GOAL #5   Title Pt will ambulate with least restrictive assistive device with supervision, at least 300 ft, for improved gait efficiency and safety.   Baseline Gait 150 ft with HHA/min asisst   Time 4   Period Weeks   Status New  PT Long Term Goals - 06/09/15 0909    PT LONG TERM GOAL #1   Title Pt will verbalize understanding of fall prevention within the home environment.  TARGET 09/08/15   Baseline at fall risk per Berg, TUG, gait velocity scores   Time 12   Period Weeks   Status New   PT LONG TERM GOAL #2   Title Pt will perform at least 8 of 10 reps of sit<>stand transfers with no UE support, independently for improved efficiency and safety with transfers.   Baseline Requires UE support for sit<>stand.   Time 12   Period Weeks   Status New   PT LONG TERM GOAL #3   Title Pt will improve Berg Balance score to at least 38/56 for decreased fall risk.   Baseline Merrilee Jansky 29/56   Time 12   Period Weeks   Status New   PT LONG TERM GOAL #4   Title Pt will improve gait velocity to at least 1.8 ft/sec for improved gait efficiency and safety.   Baseline gait velocity 1.24 ft/sec (<1.8 ft/sec indicates increased fall risk.)   Time 12   Period Weeks   Status New   PT LONG TERM GOAL #5   Title Pt will verbalize understanding of optimal/community fitness options upon D/C from PT.   Baseline No formal current fitness/exercise routine   Time 12   Period Weeks   Status New               Plan - 06/18/15 1207    Clinical Impression  Statement Pt seen today for initiation of HEP for lower extremity strengthening, gait training with trial of orthotic.  Without orthotic on RLE, pt ambulates with decreased step length, decreased foot clearance/decr. heelstrike, decreased full knee extension due to weakness of R knee with stance phase.  Pt appears hesitant to fully place weight on RLE with gait.  With trial of R custom (off-shelf) AFO, pt demonstrates improved heelstrike, improved foot clearance, improved knee extension and weigthbearing in stance phase, with improved ability for equal step length with gait.  Pt would benefit from AFO to RLE for improved stability with gait.       Rehab Potential Good   Clinical Impairments Affecting Rehab Potential (Medicaid only approved 3 visits)   PT Frequency --  16 visits   PT Duration --  over 3 month period   PT Treatment/Interventions ADLs/Self Care Home Management;Therapeutic exercise;Therapeutic activities;Functional mobility training;Stair training;DME Instruction;Gait training;Balance training;Neuromuscular re-education;Patient/family education;Orthotic Fit/Training   PT Next Visit Plan Review HEP, gait training with RW with trial of orthotics, balance exercises for HEP   Recommended Other Services Recommend AFO consult-order requested from Dr. Tomi Likens (and received); spoke with Marcello Moores at [facility] and pt can make appt at clinic once paperwork received.   Consulted and Agree with Plan of Care Patient      Patient will benefit from skilled therapeutic intervention in order to improve the following deficits and impairments:  Abnormal gait, Decreased balance, Decreased mobility, Decreased safety awareness, Decreased strength, Difficulty walking, Impaired sensation, Decreased endurance, Impaired tone  Visit Diagnosis: Muscle weakness (generalized)  Other abnormalities of gait and mobility     Problem List Patient Active Problem List   Diagnosis Date Noted  . Multiple sclerosis  exacerbation ([place]) 05/25/2015  . Acute right-sided weakness 05/25/2015  . Intractable headache 05/25/2015  . Adjustment disorder with anxious mood   . Solitary pulmonary nodule 12/21/2013  . Dyspnea 12/18/2013  . Elevated LFTs 04/15/2013  .  Vomiting 04/14/2013  . Abdominal pain 04/14/2013  . Sinus tachycardia ([place]) 04/14/2013  . Nipple discharge 11/21/2012    Frazier Butt. 06/18/2015, 12:15 PM Frazier Butt., PT [facility] [address] [place], [place], [address] Phone: [phone]   Fax:  [phone]  Name: Amy Rivas MRN: [id number] Date of Birth: 11/05/1990

## 2015-06-18 NOTE — Telephone Encounter (Signed)
Amy Rivas, please order via EPIC for AFO consult for right AFO.  Thank you

## 2015-06-18 NOTE — Telephone Encounter (Signed)
Dr. Tomi Likens, Amy Rivas has been evaluated by PT and seen for her first visit today.  We trialed an AFO (ankle foot orthotic) on her RLE today, with improved foot clearance, decreased R foot drag, and improved knee stability.  Pt would benefit from AFO consult and likely R AFO to improve stability and safety with gait.  If you agree, could you please send order via EPIC for AFO Consult for R AFO?  Thank you for your collaboration with this patient.  Sincerely, Mady Haagensen, PT

## 2015-06-23 ENCOUNTER — Ambulatory Visit: Payer: Medicaid Other | Admitting: Physical Therapy

## 2015-06-23 ENCOUNTER — Encounter: Payer: Medicaid Other | Admitting: Occupational Therapy

## 2015-06-24 ENCOUNTER — Ambulatory Visit: Payer: Medicaid Other | Admitting: Neurology

## 2015-06-25 ENCOUNTER — Ambulatory Visit: Payer: Medicaid Other | Admitting: Physical Therapy

## 2015-06-29 ENCOUNTER — Ambulatory Visit: Payer: Medicaid Other | Admitting: Physical Therapy

## 2015-06-29 ENCOUNTER — Telehealth: Payer: Self-pay

## 2015-06-29 ENCOUNTER — Encounter: Payer: Medicaid Other | Admitting: Occupational Therapy

## 2015-06-29 NOTE — Telephone Encounter (Signed)
I am not aware of interaction between metoprolol and Tecfidera.  Flushing may be expected, but the rash is unusual.  I would of course discontinue Tecfidera and take Benadryl for the rash/pruritus.  She should review the other medications provided and choose an alternative.

## 2015-06-29 NOTE — Telephone Encounter (Signed)
Pt started Tecfidera today. First dose was at 7:45 this morning, by 9:40 pt started having side effects. Pt is experiencing flushness/redness, welts, and itching all over. Pt did call [employer]Tecfider[employer], who advised her to call us. Pt was advised while on the phone if symptoms got worse before she heard back from me to contact PCP to see if she can take Benadryl or something similar (pt was worried about reactions with her heart medication, metoprolol). Please advise.    [phone]

## 2015-06-29 NOTE — Telephone Encounter (Signed)
Relayed message. Pt aware. Denied questions. Will review other medication information and return call after symptoms cleared for start of different medication.

## 2015-07-01 ENCOUNTER — Encounter: Payer: Medicaid Other | Admitting: Occupational Therapy

## 2015-07-01 ENCOUNTER — Ambulatory Visit: Payer: Medicaid Other | Admitting: Physical Therapy

## 2015-07-06 ENCOUNTER — Ambulatory Visit: Payer: Medicaid Other | Admitting: Physical Therapy

## 2015-07-06 ENCOUNTER — Encounter: Payer: Medicaid Other | Admitting: Occupational Therapy

## 2015-07-08 ENCOUNTER — Telehealth: Payer: Self-pay

## 2015-07-08 DIAGNOSIS — G35 Multiple sclerosis: Secondary | ICD-10-CM

## 2015-07-08 NOTE — Telephone Encounter (Signed)
We need to check JC virus antibody with titer first. Based on the results, we can determine if Tysabri would be appropriate

## 2015-07-08 NOTE — Telephone Encounter (Signed)
Pt called, has decided she would like to start Tysabri. Form completed and in inbox for signature. Pt will come in soon to sign her portion of form. Pt was wondering when she would need to have JCV tested. Please advise.

## 2015-07-09 ENCOUNTER — Ambulatory Visit: Payer: Medicaid Other | Admitting: Physical Therapy

## 2015-07-09 ENCOUNTER — Encounter: Payer: Medicaid Other | Admitting: Occupational Therapy

## 2015-07-09 NOTE — Telephone Encounter (Signed)
Will have patient have blood drawn when she comes in to sign Tysabri forms. Future order placed.

## 2015-07-09 NOTE — Telephone Encounter (Signed)
Detailed message left on pt's line to get bloodwork done while in office completing forms.

## 2015-07-12 ENCOUNTER — Other Ambulatory Visit: Payer: Medicaid Other

## 2015-07-12 DIAGNOSIS — G35 Multiple sclerosis: Secondary | ICD-10-CM

## 2015-07-13 ENCOUNTER — Ambulatory Visit: Payer: Medicaid Other | Admitting: Physical Therapy

## 2015-07-13 ENCOUNTER — Encounter: Payer: Medicaid Other | Admitting: Occupational Therapy

## 2015-07-15 ENCOUNTER — Ambulatory Visit: Payer: Medicaid Other | Admitting: Physical Therapy

## 2015-07-15 ENCOUNTER — Encounter: Payer: Medicaid Other | Admitting: Occupational Therapy

## 2015-07-15 LAB — STRATIFY JCV AB (W/ INDEX) W/ RFLX
Index Value: 0.1
JCV ANTIBODY: NEGATIVE

## 2015-07-16 ENCOUNTER — Telehealth: Payer: Self-pay

## 2015-07-16 NOTE — Telephone Encounter (Signed)
Tysabri form submitted via fax. Will send pt mychart message to make her aware.

## 2015-07-16 NOTE — Telephone Encounter (Signed)
-----   Message from Pieter Partridge, DO sent at 07/16/2015  7:21 AM EDT ----- JC Virus negative.  We can initiate Tysabri.  I would like her to repeat JC Virus antibody titer with index in 6 months with follow up right afterwards.

## 2015-07-23 ENCOUNTER — Ambulatory Visit: Payer: Medicaid Other | Admitting: Physical Therapy

## 2015-07-28 ENCOUNTER — Ambulatory Visit: Payer: Medicaid Other | Admitting: Physical Therapy

## 2015-08-02 ENCOUNTER — Other Ambulatory Visit: Payer: Self-pay

## 2015-08-04 ENCOUNTER — Ambulatory Visit: Payer: Medicaid Other | Attending: Internal Medicine | Admitting: Physical Therapy

## 2015-08-04 DIAGNOSIS — R2681 Unsteadiness on feet: Secondary | ICD-10-CM | POA: Insufficient documentation

## 2015-08-04 DIAGNOSIS — R2689 Other abnormalities of gait and mobility: Secondary | ICD-10-CM | POA: Insufficient documentation

## 2015-08-04 DIAGNOSIS — M6281 Muscle weakness (generalized): Secondary | ICD-10-CM | POA: Insufficient documentation

## 2015-08-04 NOTE — Therapy (Signed)
[facility] [address] [place], [place], [address] Phone: [phone]   Fax:  [phone]  Physical Therapy Treatment  Patient Details  Name: Amy Rivas MRN: [id number] Date of Birth: [birth year] Referring Provider: Tomi Likens  Encounter Date: 08/04/2015      PT End of Session - 08/04/15 2159    Visit Number 3   Number of Visits 17  16 requested; granted 3 per Medicaid   Date for PT Re-Evaluation 08/07/15   Authorization Type Medicaid   Authorization Time Period 06/17/15-10/06/15   Authorization - Visit Number 2   Authorization - Number of Visits 3   PT Start Time 0937   PT Stop Time 1022   PT Time Calculation (min) 45 min   Activity Tolerance Patient tolerated treatment well   Behavior During Therapy [facility] for tasks assessed/performed      Past Medical History  Diagnosis Date  . GERD (gastroesophageal reflux disease)   . Anxiety   . Headache   . PSVT (paroxysmal supraventricular tachycardia) ([place])   . MS (multiple sclerosis) ([place]) 05/2015    Past Surgical History  Procedure Laterality Date  . Tympanostomy tube placement  1991, 1993, 2003  . Tonsillectomy and adenoidectomy  1992  . Laparoscopic cholecystectomy  2008  . Cesarean section  2010, 2012    There were no vitals filed for this visit.      Subjective Assessment - 08/04/15 0940    Subjective Got R AFO last Wednesday.  Wearing it all day-no problems.  It feels a lot more steady.   Pertinent History MS diagnosis may 2017   Patient Stated Goals Pt's goal for therapy are for strengthening R side and to improve balance and do stairs independently.   Currently in Pain? No/denies                   Therapeutic Exercise: Reviewed HEP given last visit, with pt return demonstrates understanding.       [facility] PT Treatment/Exercise - 08/04/15 0001    Transfers   Transfers Sit to Stand;Stand to Sit   Sit to Stand 5: Supervision;With  upper extremity assist;From chair/3-in-1  minimal UE support   Stand to Sit 5: Supervision;With upper extremity assist;To chair/3-in-1   Number of Reps 10 reps   Ambulation/Gait   Ambulation/Gait Yes   Ambulation/Gait Assistance 5: Supervision;4: Min guard   Ambulation Distance (Feet) 80 Feet  x 2 with RW; 25 ft x 2 with quad cane   Assistive device Rolling walker;Small based quad cane  R AFO   Gait Pattern Step-through pattern;Decreased stance time - right;Decreased step length - left;Narrow base of support   Ambulation Surface Level;Indoor   Standardized Balance Assessment   Standardized Balance Assessment Timed Up and Go Test   Timed Up and Go Test   TUG Normal TUG   Normal TUG (seconds) 26.54  with RW   Exercises   Exercises Knee/Hip   Knee/Hip Exercises: Stretches   Active Hamstring Stretch Right;3 reps;30 seconds  seated hamstring stretch   Knee/Hip Exercises: Supine   Straight Leg Raises AROM;Right;5 reps   Knee/Hip Exercises: Sidelying   Hip ABduction AROM;Right;5 reps     Standing mini-squats x 10 reps at counter, with UE support and cues for equal weightbearing through bilateral lower extremities.     Neuro Re-education: Standing at counter:  Wide BOS with lateral weigthshfiting x 10 reps, then stagger stance weight shifting forward and back, x 5 reps each position.  Step and weight shift to R x 5, then to L x 5; forward step and weightshift on LLE with RLE as weightbearing, cues for increased step length on LLE.       PT Education - 08/04/15 2159    Education provided Yes   Education Details HEP-updates-see instructions   Person(s) Educated Patient   Methods Explanation;Demonstration;Handout   Comprehension Verbalized understanding;Returned demonstration          PT Short Term Goals - 08/04/15 1013    PT SHORT TERM GOAL #1   Title Pt will be independent with HEP for strengthening, balance and gait.  Target 07/09/15   Baseline Baseline:  No current HEP    Time 4   Period Weeks   Status Achieved   PT SHORT TERM GOAL #2   Title Pt will perform at least 6 of 10 reps of sit<>stand transfers with minimal to no UE support, for improved efficiency and safety with transfers.   Baseline Requires UE support, L>R for sit<>stand   Time 4   Period Weeks   Status Achieved   PT SHORT TERM GOAL #3   Title Pt will improve TUG score to less than or equal to 13.5 seconds for decreased fall risk.   Baseline TUG:  16.08 sec (Scores >13.5 sec indicate increased fall risk.)  08/04/15 with RW:  26.54 sec   Time 4   Period Weeks   Status Not Met   PT SHORT TERM GOAL #4   Title Pt will improve Berg Balance score to at least 34/56 for decreased fall risk.   Baseline Berg score 29/56 (<45/56 indicates increased fall risk.)   Time 4   Period Weeks   Status On-going   PT SHORT TERM GOAL #5   Title Pt will ambulate with least restrictive assistive device with supervision, at least 300 ft, for improved gait efficiency and safety.   Baseline Gait 150 ft with HHA/min asisst   Time 4   Period Weeks   Status New           PT Long Term Goals - 06/09/15 0909    PT LONG TERM GOAL #1   Title Pt will verbalize understanding of fall prevention within the home environment.  TARGET 09/08/15   Baseline at fall risk per Berg, TUG, gait velocity scores   Time 12   Period Weeks   Status New   PT LONG TERM GOAL #2   Title Pt will perform at least 8 of 10 reps of sit<>stand transfers with no UE support, independently for improved efficiency and safety with transfers.   Baseline Requires UE support for sit<>stand.   Time 12   Period Weeks   Status New   PT LONG TERM GOAL #3   Title Pt will improve Berg Balance score to at least 38/56 for decreased fall risk.   Baseline Merrilee Jansky 29/56   Time 12   Period Weeks   Status New   PT LONG TERM GOAL #4   Title Pt will improve gait velocity to at least 1.8 ft/sec for improved gait efficiency and safety.   Baseline gait  velocity 1.24 ft/sec (<1.8 ft/sec indicates increased fall risk.)   Time 12   Period Weeks   Status New   PT LONG TERM GOAL #5   Title Pt will verbalize understanding of optimal/community fitness options upon D/C from PT.   Baseline No formal current fitness/exercise routine   Time 12   Period Weeks   Status New  Plan - 08/04/15 2200    Clinical Impression Statement Pt seen for therapy today, after awaiting and receiving R AFO to assist with R dorsiflexion and limit R knee recurvatum/possibility for bucking.  Pt's overall gait pattern appears improved with R AFO, but she continues to have decreased stance time on RLE and decreased step length on LLE.  HEP updated this visit, to include standing balance and strenthening.  Pt has met STG #1 and 2.  TUG goal not met due to pt having slowed gait pattern with RW and AFO.  Other STGs not met/ongoing to be assessed next visit.  Pt will conitnue to benefit from further skilled PT to address strength, balance and gait training.    Rehab Potential Good   Clinical Impairments Affecting Rehab Potential (Medicaid only approved 3 visits)   PT Frequency --  16 visits   PT Duration --  over 3 month period   PT Treatment/Interventions ADLs/Self Care Home Management;Therapeutic exercise;Therapeutic activities;Functional mobility training;Stair training;DME Instruction;Gait training;Balance training;Neuromuscular re-education;Patient/family education;Orthotic Fit/Training   PT Next Visit Plan Review HEP and progress as able next visit; continue gait training, LTGs to be checked with likely discharge next week.   Consulted and Agree with Plan of Care Patient      Patient will benefit from skilled therapeutic intervention in order to improve the following deficits and impairments:  Abnormal gait, Decreased balance, Decreased mobility, Decreased safety awareness, Decreased strength, Difficulty walking, Impaired sensation, Decreased  endurance, Impaired tone  Visit Diagnosis: Muscle weakness (generalized)  Unsteadiness on feet  Other abnormalities of gait and mobility     Problem List Patient Active Problem List   Diagnosis Date Noted  . Multiple sclerosis exacerbation ([place]) 05/25/2015  . Acute right-sided weakness 05/25/2015  . Intractable headache 05/25/2015  . Adjustment disorder with anxious mood   . Solitary pulmonary nodule 12/21/2013  . Dyspnea 12/18/2013  . Elevated LFTs 04/15/2013  . Vomiting 04/14/2013  . Abdominal pain 04/14/2013  . Sinus tachycardia ([place]) 04/14/2013  . Nipple discharge 11/21/2012    Frazier Butt. 08/04/2015, 10:13 PM  Frazier Butt., PT  [facility] [address] [place], [place], [address] Phone: [phone]   Fax:  [phone]  Name: Amy Rivas MRN: [id number] Date of Birth: [birth year]

## 2015-08-04 NOTE — Patient Instructions (Addendum)
HIP: Flexion / KNEE: Extension, Straight Leg Raise    Raise leg, keeping knee straight. Perform slowly. __5_ reps per set, _1-2__ sets per day  Copyright  VHI. All rights reserved.  ABDUCTION: Side-Lying (Active)    Lie on left side, right leg straight. Raise top leg 2-3 inches slowly and controlled, then lower down. Complete _1-2__ sets of _5__ repetitions. Perform _1-2__ sessions per day.  http://gtsc.exer.us/95   Copyright  VHI. All rights reserved.  HIP: Hamstrings - Short Sitting    Rest leg on raised surface. Keep knee straight. Lift chest and lean forward until you feel a stretch. Hold _30__ seconds. _3__ reps per set, _1-2__ sets per day  Copyright  VHI. All rights reserved.    STANDING EXERCISES AT COUNTER OR SINK:  1.  STAND WITH YOUR FEET AT LEAST SHOULDER WIDTH APART, AND SHIFT YOUR WEIGHT SIDE TO SIDE, 10 REPS EACH SIDE  2.  STAND WITH YOUR FEET SHOULDER WIDTH APART.  STEP OUT TO THE RIGHT, SHIFTING YOUR WEIGHT OVER YOUR RIGHT LEG, THEN BRING YOUR RIGHT LEG BACK TO MIDDLE.  REPEAT 5 TIMES, THEN REPEAT ON YOUR LEFT LEG.  3.  STAND WITH YOUR FEET SHOULDER WIDTH APART.  STEP FORWARD WITH RIGHT LEG, SHIFTING YOUR WEIGHT FORWARD OVER YOUR RIGHT LEG.  THEN BRING YOUR LEG BACK TO THE MIDDLE.  REPEAT 5 TIMES.  THEN REPEAT WITH YOUR LEFT LEG (KEEP YOUR RIGHT LEG STRAIGHT AS YOU STEP WITH YOUR LEFT LEG.)

## 2015-08-05 ENCOUNTER — Telehealth: Payer: Self-pay | Admitting: Neurology

## 2015-08-05 NOTE — Telephone Encounter (Signed)
Amy Rivas Apr 12, 1990. Her # [phone] [id number]. Regarding being released to drive again. Would like to know if she is released. Thank you

## 2015-08-06 ENCOUNTER — Encounter: Payer: Self-pay | Admitting: *Deleted

## 2015-08-06 ENCOUNTER — Ambulatory Visit (INDEPENDENT_AMBULATORY_CARE_PROVIDER_SITE_OTHER): Payer: Medicaid Other | Admitting: Gastroenterology

## 2015-08-06 VITALS — BP 100/66 | HR 88 | Ht <= 58 in | Wt 98.5 lb

## 2015-08-06 DIAGNOSIS — K219 Gastro-esophageal reflux disease without esophagitis: Secondary | ICD-10-CM | POA: Diagnosis not present

## 2015-08-06 DIAGNOSIS — K625 Hemorrhage of anus and rectum: Secondary | ICD-10-CM | POA: Diagnosis not present

## 2015-08-06 DIAGNOSIS — R109 Unspecified abdominal pain: Secondary | ICD-10-CM | POA: Diagnosis not present

## 2015-08-06 DIAGNOSIS — R932 Abnormal findings on diagnostic imaging of liver and biliary tract: Secondary | ICD-10-CM | POA: Diagnosis not present

## 2015-08-06 MED ORDER — OMEPRAZOLE 20 MG PO CPDR: 20.0000 mg | DELAYED_RELEASE_CAPSULE | Freq: Every day | ORAL | Status: DC

## 2015-08-06 MED ORDER — NA SULFATE-K SULFATE-MG SULF 17.5-3.13-1.6 GM/177ML PO SOLN: ORAL | Status: DC

## 2015-08-06 NOTE — Telephone Encounter (Signed)
I would follow up with OT for reassessment since they made the recommendation.

## 2015-08-06 NOTE — Telephone Encounter (Signed)
Pt wanting to drive again. Reviewed chart, only noted that on 06/08/15 & 06/18/15 P.T. Amy Gerrit Friends noted a no driving precaution, as well as O.T., Forde Radon on 06/08/15. Please advise.

## 2015-08-06 NOTE — Telephone Encounter (Signed)
Message sent via staff message to PT.

## 2015-08-06 NOTE — Patient Instructions (Signed)
You have been scheduled for an endoscopy and colonoscopy. Please follow the written instructions given to you at your visit today. Please pick up your prep supplies at the pharmacy within the next 1-3 days. If you use inhalers (even only as needed), please bring them with you on the day of your procedure. Your physician has requested that you go to www.startemmi.com and enter the access code given to you at your visit today. This web site gives a general overview about your procedure. However, you should still follow specific instructions given to you by our office regarding your preparation for the procedure.  We have sent the following medications to your pharmacy for you to pick up at your convenience: Prilosec 20 mg daily  Please purchase the following medications over the counter and take as directed: Fiber (citrucel, metamucil etc)  You have been scheduled for an abdominal ultrasound at [facility] Radiology (1st floor of hospital) on Thursday 08/12/15 at 8:30 am. Please arrive 15 minutes prior to your appointment for registration. Make certain not to have anything to eat or drink 8 hours prior to your appointment. Should you need to reschedule your appointment, please contact radiology at [phone]. This test typically takes about 30 minutes to perform.  If you are age 47 or older, your body mass index should be between 23-30. Your Body mass index is 21.52 kg/(m^2). If this is out of the aforementioned range listed, please consider follow up with your Primary Care Provider.  If you are age 53 or younger, your body mass index should be between 19-25. Your Body mass index is 21.52 kg/(m^2). If this is out of the aformentioned range listed, please consider follow up with your Primary Care Provider.

## 2015-08-06 NOTE — Progress Notes (Signed)
HPI :  25 y/o female with history of multiple sclerosis and PSVT (controlled), here for evaluation of abdominal pains / GERD.  Patient reports abdominal pains bother her for 4 weeks or so. Pain is just below the umbilicus up through the epigastric area. She reports the pain is present all the time, but the severity fluctuates. Rated 2-3/10 at baseline. When gets bad can reach 10/10. She reports the severe pain is rare and can bother her more after she eats. She thinks eating corn reliably produces her symptoms. Severe pain can last a few hours to a day when she gets it. No nausea or vomiting. Her symptoms after she eats can be sporadic, pain not always worsened by eating. No weight loss she endorses. She denies any alleviating factors. She thinks standing up or straightening up can make the pain worse.   She is endorsing heartburn that bothers her, she thinks ongoing for years. She reports she has reflux symptoms that bother her any time of day. She does not take anything for this. No dysphagia or odynophagia.   She denies any trouble with diarrhea or constipation. However, she has had some red blood in her stools which has occurred over the past few months. She sees this about once or twice per week. Having a bowel movement does not alleviate her pain. She has periodic perianal pain when passing blood in the stools.   No known FH of CRC. She is not sure if anyone has Crohns in the family.   She reports a history of elevated liver enzymes for which she had a liver biopsy as a child. She reports she was told to have US liver every 6 months but she has not had one in a few years. She reports she has had a history of "elevations of her liver enzymes" in the past, she thinks in 2015 and 2016. She denies jaundice. She denies recent liver biopsy. She tested negative for viral hepatitis in 2015 when her ALT rose to 80s. She then had a one time elevation of ALT to 300s in 2016 but levels have since been  normal on most recent labs, reviewed in paper chart with normal ALT  She takes ibuprofen PRN for headaches. She takes it 4-5 times per month. She denies any history of endoscopy.      Past Medical History  Diagnosis Date  . GERD (gastroesophageal reflux disease)   . Anxiety   . Headache   . PSVT (paroxysmal supraventricular tachycardia) ([place])   . MS (multiple sclerosis) ([place]) 05/2015  . Elevated LFTs   . Hemangioma     as a child; had liver biopsy but unsure of results  . Ovarian cyst   . Vitamin D deficiency      Past Surgical History  Procedure Laterality Date  . Tympanostomy tube placement  1991, 1993, 2003  . Tonsillectomy and adenoidectomy  1992  . Laparoscopic cholecystectomy  2008  . Cesarean section  2010, 2012   Family History  Problem Relation Age of Onset  . Lung cancer Maternal Grandmother   . Heart disease Mother   . Diabetes Father   . Cancer Maternal Uncle     type unknown  . Diabetes Maternal Grandmother   . Asthma Maternal Grandmother   . Diabetes Maternal Grandfather   . Diabetes Paternal Grandmother   . Diabetes Paternal Grandfather   . Breast cancer Maternal Grandmother    Social History  Substance Use Topics  . Smoking status: Never Smoker   .  Smokeless tobacco: Never Used  . Alcohol Use: No   Current Outpatient Prescriptions  Medication Sig Dispense Refill  . acetaminophen (TYLENOL) 500 MG tablet Take 1,000 mg by mouth every 6 (six) hours as needed for fever.    Marland Kitchen ibuprofen (ADVIL,MOTRIN) 200 MG tablet Take 200 mg by mouth every 6 (six) hours as needed.    . metoprolol tartrate (LOPRESSOR) 25 MG tablet Take 25 mg by mouth 2 (two) times daily.    . Vitamin D, Ergocalciferol, (DRISDOL) 50000 units CAPS capsule Take 50,000 Units by mouth every 7 (seven) days.     No current facility-administered medications for this visit.   Allergies  Allergen Reactions  . Albuterol Anaphylaxis  . Hydrocodone Anaphylaxis and Nausea And Vomiting  .  Codeine Nausea And Vomiting  . Lortab [Hydrocodone-Acetaminophen] Nausea And Vomiting    Patient can tolerate acetaminophen solely  . Morphine And Related Swelling and Rash  . Tecfidera [Dimethyl Fumarate] Hives and Swelling     Review of Systems: All systems reviewed and negative except where noted in HPI.   Lab Results  Component Value Date   WBC 9.1 06/07/2015   HGB 14.7 06/07/2015   HCT 42.8 06/07/2015   MCV 90.6 06/07/2015   PLT 242.0 06/07/2015   Lab Results  Component Value Date   CREATININE 0.84 06/07/2015   BUN 14 06/07/2015   NA 140 06/07/2015   K 3.8 06/07/2015   CL 107 06/07/2015   CO2 25 06/07/2015    Lab Results  Component Value Date   ALT 18 06/07/2015   AST 14 06/07/2015   ALKPHOS 38* 06/07/2015   BILITOT 0.7 06/07/2015   Lab Results  Component Value Date   LIPASE 19 01/25/2015     Physical Exam: BP 100/66 mmHg  Pulse 88  Ht 4' 8.75" (1.441 m)  Wt 98 lb 8 oz (44.679 kg)  BMI 21.52 kg/m2  LMP 08/05/2015 Constitutional: Pleasant,well-developed, female in no acute distress. HEENT: Normocephalic and atraumatic. Conjunctivae are normal. No scleral icterus. Neck supple.  Cardiovascular: Normal rate, regular rhythm.  Pulmonary/chest: Effort normal and breath sounds normal. No wheezing, rales or rhonchi. Abdominal: Soft, nondistended, epigastric and periumbilical TTP with (+) Carnett, Bowel sounds active throughout. There are no masses palpable.  Extremities: no edema Lymphadenopathy: No cervical adenopathy noted. Neurological: Alert and oriented to person place and time. Skin: Skin is warm and dry. No rashes noted. Psychiatric: Normal mood and affect. Behavior is normal.   ASSESSMENT AND PLAN: 25 y/o female with MS and PSVT, here to be evaluated for the following issues:  Abdominal pain / Rectal bleeding - as outlined above, periumbilical to epigastric location, sometimes worse with eating, although in some level of pain at all times. She has  a positive Carnett sign and this could be abdominal wall pain, but given her postprandial worsening and rectal bleeding she warrants further evaluation. Recommend EGD to clear the upper tract, rule out H pylori, PUD, etc, and colonoscopy to evaluate her rectal bleeding. I discussed risks / benefits of these procedures and she wished to proceed. In the interim will place her on a trial of omeprazole for her baseline reflux symptoms, 20mg  per day, and asked her to avoid NSAIDs. Also recommend a daily fiber supplement to treat empirically for hemorrhoids which may be the cause of her bleeding. Further recommendations pending this result. She may need cross sectional imaging if symptoms persist and workup otherwise unremarkable  Abnormal liver imaging / fluctuating ALT - it appears she  has benign hemangiomas noted on last [place] i n2015, for which radiology had recommended an interval [place] 6 months later but was not done. I will order this to ensure no interval changes but think it less likely it is related to her symptoms. Otherwise she has had 2 episodes of elevations in ALT over time, hep panel negative, but ALT normal now. Will await [place] at this time and she should have LFTs repeated in a few months. If she has persistent elevation will need further labs to rule out chronic liver diseases.   GERD - as above, trial of omeprazole 20mg  daily  Seville Cellar, MD [facility] Pager [phone]  CC: Osa Craver, MD

## 2015-08-06 NOTE — Telephone Encounter (Signed)
Please see below.

## 2015-08-06 NOTE — Telephone Encounter (Signed)
Left message on machine for pt to return call to the office.  

## 2015-08-11 ENCOUNTER — Ambulatory Visit: Payer: Medicaid Other | Admitting: Physical Therapy

## 2015-08-11 DIAGNOSIS — M6281 Muscle weakness (generalized): Secondary | ICD-10-CM | POA: Diagnosis not present

## 2015-08-11 DIAGNOSIS — R2689 Other abnormalities of gait and mobility: Secondary | ICD-10-CM

## 2015-08-11 NOTE — Patient Instructions (Signed)
It is important to avoid accidents which may result in broken bones.  Here are a few ideas on how to make your home safer so you will be less likely to trip or fall.  1. Use nonskid mats or non slip strips in your shower or tub, on your bathroom floor and around sinks.  If you know that you have spilled water, wipe it up! 2. In the bathroom, it is important to have properly installed grab bars on the walls or on the edge of the tub.  Towel racks are NOT strong enough for you to hold onto or to pull on for support. 3. Stairs and hallways should have enough light.  Add lamps or night lights if you need ore light. 4. It is good to have handrails on both sides of the stairs if possible.  Always fix broken handrails right away. 5. It is important to see the edges of steps.  Paint the edges of outdoor steps white so you can see them better.  Put colored tape on the edge of inside steps. 6. Throw-rugs are dangerous because they can slide.  Removing the rugs is the best idea, but if they must stay, add adhesive carpet tape to prevent slipping. 7. Do not keep things on stairs or in the halls.  Remove small furniture that blocks the halls as it may cause you to trip.  Keep telephone and electrical cords out of the way where you walk. 8. Always were sturdy, rubber-soled shoes for good support.  Never wear just socks, especially on the stairs.  Socks may cause you to slip or fall.  Do not wear full-length housecoats as you can easily trip on the bottom.  9. Place the things you use the most on the shelves that are the easiest to reach.  If you use a stepstool, make sure it is in good condition.  If you feel unsteady, DO NOT climb, ask for help. 10. If a health professional advises you to use a cane or walker, do not be ashamed.  These items can keep you from falling and breaking your bones.   [facility] on [address] approx $12/month [facility] [phone] [facility] fitness room, personal training, fitness classes for injury prevention, strength, balance, flexibility, water fitness classes Ages 55+: $2 for 6 months; Ages 14-54: $72 for 6 months

## 2015-08-12 ENCOUNTER — Ambulatory Visit (HOSPITAL_COMMUNITY): Payer: Medicaid Other

## 2015-08-12 NOTE — Therapy (Signed)
[facility] [address] [place], [place], [address] Phone: [phone]   Fax:  [phone]  Physical Therapy Treatment  Patient Details  Name: Amy Rivas MRN: [id number] Date of Birth: 1990-07-20 Referring Provider: Tomi Likens  Encounter Date: 08/11/2015      PT End of Session - 08/12/15 0816    Visit Number 4   Number of Visits 17  16 requested; granted 3 per Medicaid   Date for PT Re-Evaluation 08/07/15   Authorization Type Medicaid   Authorization Time Period 06/17/15-10/06/15   Authorization - Visit Number 3   Authorization - Number of Visits 3   PT Start Time 0932   PT Stop Time 1023   PT Time Calculation (min) 51 min   Activity Tolerance Patient tolerated treatment well   Behavior During Therapy [facility] for tasks assessed/performed      Past Medical History:  Diagnosis Date  . Anxiety   . Elevated LFTs   . GERD (gastroesophageal reflux disease)   . Headache   . Hemangioma    as a child; had liver biopsy but unsure of results  . MS (multiple sclerosis) ([place]) 05/2015  . Ovarian cyst   . PSVT (paroxysmal supraventricular tachycardia) ([place])   . Vitamin D deficiency     Past Surgical History:  Procedure Laterality Date  . CESAREAN SECTION  2010, 2012  . LAPAROSCOPIC CHOLECYSTECTOMY  2008  . TONSILLECTOMY AND ADENOIDECTOMY  1992  . TYMPANOSTOMY [place], 2003    There were no vitals filed for this visit.      Subjective Assessment - 08/11/15 0938    Subjective Having some bruising on medial, lateral aspect of lower leg, slight bruising which has gone away in posterior aspect of lower leg.  Advised pt to contact orthotist.   Patient Stated Goals Pt's goal for therapy are for strengthening R side and to improve balance and do stairs independently.   Currently in Pain? No/denies            [facility] PT Assessment - 08/12/15 0807      Sensation   Proprioception Appears Intact  R  great toe-pt [facility] reporting position during testing     Strength   Right Hip Flexion 4/5   Right Knee Flexion 3+/5   Right Knee Extension 3+/5   Right Ankle Dorsiflexion 3+/5   Right Ankle Eversion 4/5  4/5 R ankle inversion     Berg Balance Test   Sit to Stand Able to stand without using hands and stabilize independently   Standing Unsupported Able to stand safely 2 minutes   Sitting with Back Unsupported but Feet Supported on Floor or Stool Able to sit safely and securely 2 minutes   Stand to Sit Sits safely with minimal use of hands   Transfers Able to transfer safely, minor use of hands   Standing Unsupported with Eyes Closed Able to stand 10 seconds safely   Standing Ubsupported with Feet Together Able to place feet together independently and stand 1 minute safely   From Standing, Reach Forward with Outstretched Arm Can reach confidently >25 cm (10")   From Standing Position, Pick up Object from Floor Able to pick up shoe, needs supervision   From Standing Position, Turn to Look Behind Over each Shoulder Looks behind from both sides and weight shifts well   Turn 360 Degrees Needs close supervision or verbal cueing   Standing Unsupported, Alternately Place Feet on Step/Stool Needs assistance to  keep from falling or unable to try   Standing Unsupported, One Foot in [place] to take small step independently and hold 30 seconds   Standing on One Leg Able to lift leg independently and hold 5-10 seconds  standing on LLE   Total Score 45   Berg comment: Improved score from 29/56 at eval                     [facility] Adult PT Treatment/Exercise - 08/12/15 0001      Transfers   Transfers Sit to Stand;Stand to Sit   Sit to Stand 6: Modified independent (Device/Increase time)   Stand to Sit 6: Modified independent (Device/Increase time)   Number of Reps 10 reps  from 20" mat surface     Ambulation/Gait   Ambulation/Gait Yes   Ambulation/Gait Assistance 5: Supervision    Ambulation/Gait Assistance Details Pt appears to have R knee recurvatum during gait today; upon further inspection, pt has decreased L step length, early R toe off>R knee recurvatum.  Gait training, with instruction for full R foot placement and knee extension in stance phase, in order to take larger step with LLE.  Pt able to verbalize and demonstrate understanding.   Ambulation Distance (Feet) 80 Feet  x2, then 80 ft all with RW   Assistive device Rolling walker   Gait Pattern Step-through pattern;Decreased stance time - right;Decreased step length - left;Narrow base of support;Right genu recurvatum  early toe off on RLE   Ambulation Surface Level;Unlevel   Gait velocity 24.59 sec = 1.33 ft/sec     Exercises   Exercises Other Exercises   Other Exercises  Instructed patient in and provided patient with red theraband for resisted R ankle dorsiflexion.  Recommended 1-2 sets of 10 reps.      Self Care:  Discussed pt's bruising around the top of her AFO-recommended patient follow up with orthotist as soon as possible for added padding to prevent future skin issues.  Also discussed return to orthotist for possible adjustment due to increased recurvatum today.  However, after gait training with cues for full foot placement on RLE, pt better able to control recurvatum.  Discussed pt's question regarding driving.  Ultimately, PT does not make return to driving decision, as recommendation for no driving was made from physician in hospital.  Explained tests and measures today, including increased ankle strength, proprioception and sensation within normal limits.  Recommended not using brace during driving when cleared to drive.  Discussed driving evaluation as possibility.   Provided patient with information on fall prevention and community fitness.          PT Education - 08/12/15 0814    Education provided Yes   Education Details fall prevention, [employer] information for community  fitness   Person(s) Educated Patient   Methods Explanation;Handout   Comprehension Verbalized understanding          PT Short Term Goals - 08/04/15 1013      PT SHORT TERM GOAL #1   Title Pt will be independent with HEP for strengthening, balance and gait.  Target 07/09/15   Baseline Baseline:  No current HEP   Time 4   Period Weeks   Status Achieved     PT SHORT TERM GOAL #2   Title Pt will perform at least 6 of 10 reps of sit<>stand transfers with minimal to no UE support, for improved efficiency and safety with transfers.   Baseline Requires UE support, L>R  for sit<>stand   Time 4   Period Weeks   Status Achieved     PT SHORT TERM GOAL #3   Title Pt will improve TUG score to less than or equal to 13.5 seconds for decreased fall risk.   Baseline TUG:  16.08 sec (Scores >13.5 sec indicate increased fall risk.)  08/04/15 with RW:  26.54 sec   Time 4   Period Weeks   Status Not Met     PT SHORT TERM GOAL #4   Title Pt will improve Berg Balance score to at least 34/56 for decreased fall risk.   Baseline Berg score 29/56 (<45/56 indicates increased fall risk.)   Time 4   Period Weeks   Status On-going     PT SHORT TERM GOAL #5   Title Pt will ambulate with least restrictive assistive device with supervision, at least 300 ft, for improved gait efficiency and safety.   Baseline Gait 150 ft with HHA/min asisst   Time 4   Period Weeks   Status New           PT Long Term Goals - 08/11/15 1749      PT LONG TERM GOAL #1   Title Pt will verbalize understanding of fall prevention within the home environment.  TARGET 09/08/15   Baseline at fall risk per Berg, TUG, gait velocity scores   Time 12   Period Weeks   Status Achieved     PT LONG TERM GOAL #2   Title Pt will perform at least 8 of 10 reps of sit<>stand transfers with no UE support, independently for improved efficiency and safety with transfers.   Baseline --   Time 12   Period Weeks   Status Achieved     PT  LONG TERM GOAL #3   Title Pt will improve Berg Balance score to at least 38/56 for decreased fall risk.   Baseline Berg 45/56 at d/c   Time 12   Period Weeks   Status Achieved     PT LONG TERM GOAL #4   Title Pt will improve gait velocity to at least 1.8 ft/sec for improved gait efficiency and safety.   Baseline At d/c, gait velocity 1.33 ft/sec   Time 12   Period Weeks   Status Not Met     PT LONG TERM GOAL #5   Title Pt will verbalize understanding of optimal/community fitness options upon D/C from PT.   Baseline No formal current fitness/exercise routine   Time 12   Period Weeks   Status Achieved               Plan - 08/12/15 0818    Clinical Impression Statement Pt has met LTG #1, 2, 3, and 5.  LTG #4 not met for gait velocity.  Pt is making improvement in strength and balance, but continues to demonstrate slowed gait pattern.  Pt able to improve gait pattern today with cues, but part of pt's slowed gait pattern is due to pt's increased level of concentration with gait.  Advised patient to follow up with orthotist because of bruising around superior aspect of AFO; advised patient to follow up with neurologist with questions on driving.  Pt is appropriate for discharge at this visit.   Rehab Potential Good   Clinical Impairments Affecting Rehab Potential (Medicaid only approved 3 visits)   PT Frequency --  16 visits   PT Duration --  over 3 month period   PT Treatment/Interventions ADLs/Self Care  Home Management;Therapeutic exercise;Therapeutic activities;Functional mobility training;Stair training;DME Instruction;Gait training;Balance training;Neuromuscular re-education;Patient/family education;Orthotic Fit/Training   PT Next Visit Plan Discharge this visit.   Consulted and Agree with Plan of Care Patient      Patient will benefit from skilled therapeutic intervention in order to improve the following deficits and impairments:  Abnormal gait, Decreased balance,  Decreased mobility, Decreased safety awareness, Decreased strength, Difficulty walking, Impaired sensation, Decreased endurance, Impaired tone  Visit Diagnosis: Other abnormalities of gait and mobility  Muscle weakness (generalized)     Problem List Patient Active Problem List   Diagnosis Date Noted  . Multiple sclerosis exacerbation ([place]) 05/25/2015  . Acute right-sided weakness 05/25/2015  . Intractable headache 05/25/2015  . Adjustment disorder with anxious mood   . Solitary pulmonary nodule 12/21/2013  . Dyspnea 12/18/2013  . Elevated LFTs 04/15/2013  . Vomiting 04/14/2013  . Abdominal pain 04/14/2013  . Sinus tachycardia ([place]) 04/14/2013  . Nipple discharge 11/21/2012    Frazier Butt. 08/12/2015, 8:33 AM Frazier Butt., PT [facility] [address] [place], [place], [address] Phone: [phone]   Fax:  [phone]  Name: DARIANE NATZKE MRN: [id number] Date of Birth: November 24, 1990   PHYSICAL THERAPY DISCHARGE SUMMARY  Visits from Start of Care: 4 (3 treatment visits + eval)  Current functional level related to goals / functional outcomes:     PT Long Term Goals - 08/11/15 0953      PT LONG TERM GOAL #1   Title Pt will verbalize understanding of fall prevention within the home environment.  TARGET 09/08/15   Baseline at fall risk per Berg, TUG, gait velocity scores   Time 12   Period Weeks   Status Achieved     PT LONG TERM GOAL #2   Title Pt will perform at least 8 of 10 reps of sit<>stand transfers with no UE support, independently for improved efficiency and safety with transfers.   Baseline --   Time 12   Period Weeks   Status Achieved     PT LONG TERM GOAL #3   Title Pt will improve Berg Balance score to at least 38/56 for decreased fall risk.   Baseline Berg 45/56 at d/c   Time 12   Period Weeks   Status Achieved     PT LONG TERM GOAL #4   Title Pt will improve gait velocity to at  least 1.8 ft/sec for improved gait efficiency and safety.   Baseline At d/c, gait velocity 1.33 ft/sec   Time 12   Period Weeks   Status Not Met     PT LONG TERM GOAL #5   Title Pt will verbalize understanding of optimal/community fitness options upon D/C from PT.   Baseline No formal current fitness/exercise routine   Time 12   Period Weeks   Status Achieved    Pt has demonstrated improvements in strength and balance.  Continued slow pace of gait, but appears safe with RW.   Remaining deficits: Functional strength, balance (remains at fall risk), gait speed and independence   Education / Equipment: Pt has been educated in HEP, fall prevention, gait training with AFO, community fitness options-pt verbalizes understanding.  Plan: Patient agrees to discharge.  Patient goals were partially met. Patient is being discharged due to financial reasons.  ?????(medicaid only allowed 3 visits)        Mady Haagensen, PT 08/12/15 8:39 AM Phone: [phone] Fax: [phone]

## 2015-08-17 ENCOUNTER — Telehealth: Payer: Self-pay

## 2015-08-17 NOTE — Telephone Encounter (Signed)
Please see messages below.

## 2015-08-17 NOTE — Telephone Encounter (Signed)
PT called to find out about driving. Relayed that it would be up to PT/OT who advised no driving to decide.   [facility] ([address] [place], [place] [address] [phone] Fax: [phone])  To see if patient would need a new referral for a re-evalaution to see if she can drive. Staff was going to contact Mrs. Forde Radon (OT) to see, as  Mady Haagensen (PT) is out sick today. Staff advised she would contact pt directly.

## 2015-08-17 NOTE — Telephone Encounter (Signed)
Message relayed to patient. Verbalized understanding and denied questions.   

## 2015-08-17 NOTE — Telephone Encounter (Signed)
PT called back and said that the OT will not see her for a reevaluation on her driving and the PT was not comfortable making that decision. Please advise.

## 2015-08-17 NOTE — Telephone Encounter (Signed)
From my standpoint, I never thought she couldn't drive.

## 2015-08-18 ENCOUNTER — Encounter: Payer: Self-pay | Admitting: Neurology

## 2015-08-18 ENCOUNTER — Ambulatory Visit: Payer: Medicaid Other | Admitting: Physical Therapy

## 2015-08-18 ENCOUNTER — Telehealth: Payer: Self-pay | Admitting: Neurology

## 2015-08-18 NOTE — Telephone Encounter (Signed)
VM-Angie with [employer] called in regards to PT and would like a call back/Dawn CB# [phone]

## 2015-08-18 NOTE — Telephone Encounter (Signed)
Okay to write letter to release patient to drive per Dr. Tomi Likens. Letter written. [facility] making patient aware letter at front desk for pick up.

## 2015-08-19 ENCOUNTER — Ambulatory Visit (HOSPITAL_COMMUNITY): Payer: Medicaid Other

## 2015-08-20 NOTE — Telephone Encounter (Signed)
Called and spoke to [place]. [employer] just wanted to make sure that patient had been scheduled for her Tysabri appointment. It has been scheduled for 09/02/15

## 2015-08-25 ENCOUNTER — Ambulatory Visit (HOSPITAL_COMMUNITY)
Admission: RE | Admit: 2015-08-25 | Discharge: 2015-08-25 | Disposition: A | Discharge status: Home / Self Care | Payer: Medicaid Other | Source: Ambulatory Visit | Attending: Gastroenterology | Admitting: Gastroenterology

## 2015-08-25 DIAGNOSIS — D1803 Hemangioma of intra-abdominal structures: Secondary | ICD-10-CM | POA: Insufficient documentation

## 2015-08-25 DIAGNOSIS — K7689 Other specified diseases of liver: Secondary | ICD-10-CM | POA: Insufficient documentation

## 2015-08-25 DIAGNOSIS — R109 Unspecified abdominal pain: Secondary | ICD-10-CM | POA: Diagnosis present

## 2015-08-25 DIAGNOSIS — R932 Abnormal findings on diagnostic imaging of liver and biliary tract: Secondary | ICD-10-CM | POA: Diagnosis not present

## 2015-08-31 ENCOUNTER — Telehealth: Payer: Self-pay

## 2015-08-31 ENCOUNTER — Encounter (HOSPITAL_COMMUNITY)
Admission: RE | Admit: 2015-08-31 | Discharge: 2015-08-31 | Disposition: A | Discharge status: Home / Self Care | Payer: Medicaid Other | Source: Ambulatory Visit | Attending: Neurology | Admitting: Neurology

## 2015-08-31 NOTE — Telephone Encounter (Signed)
Pt no showed for first Tysabri infusion. Was scheduled at [facility] for 1 p.m. Attempted to reach pt. NO answer.

## 2015-09-15 ENCOUNTER — Encounter: Payer: Self-pay | Admitting: Gastroenterology

## 2015-09-15 ENCOUNTER — Ambulatory Visit (AMBULATORY_SURGERY_CENTER): Payer: Medicaid Other | Admitting: Gastroenterology

## 2015-09-15 VITALS — BP 117/66 | HR 76 | Temp 99.8°F | Resp 29 | Ht <= 58 in | Wt 98.0 lb

## 2015-09-15 DIAGNOSIS — K921 Melena: Secondary | ICD-10-CM | POA: Diagnosis not present

## 2015-09-15 DIAGNOSIS — K219 Gastro-esophageal reflux disease without esophagitis: Secondary | ICD-10-CM | POA: Diagnosis not present

## 2015-09-15 DIAGNOSIS — R109 Unspecified abdominal pain: Secondary | ICD-10-CM

## 2015-09-15 DIAGNOSIS — K626 Ulcer of anus and rectum: Secondary | ICD-10-CM

## 2015-09-15 MED ORDER — SODIUM CHLORIDE 0.9 % IV SOLN: 500.0000 mL | INTRAVENOUS | Status: DC

## 2015-09-15 NOTE — Op Note (Signed)
[facility] Patient Name: Amy Rivas Procedure Date: 09/15/2015 1:23 PM MRN: [id number] Endoscopist: Remo Lipps P. Havery Moros , MD Age: 25 Referring MD:  Date of Birth: Mar 13, 1990 Gender: Female Account #: [id number] Procedure:                Upper GI endoscopy Indications:              Epigastric abdominal pain, Heartburn Medicines:                Monitored Anesthesia Care Procedure:                Pre-Anesthesia Assessment:                           - Prior to the procedure, a History and Physical                            was performed, and patient medications and                            allergies were reviewed. The patient's tolerance of                            previous anesthesia was also reviewed. The risks                            and benefits of the procedure and the sedation                            options and risks were discussed with the patient.                            All questions were answered, and informed consent                            was obtained. Prior Anticoagulants: The patient has                            taken no previous anticoagulant or antiplatelet                            agents. ASA Grade Assessment: III - A patient with                            severe systemic disease. After reviewing the risks                            and benefits, the patient was deemed in                            satisfactory condition to undergo the procedure.                           After obtaining informed consent, the endoscope was  passed under direct vision. Throughout the                            procedure, the patient's blood pressure, pulse, and                            oxygen saturations were monitored continuously. The                            Model GIF-[id number]) scope was introduced                            through the mouth, and advanced to the second part                            of  duodenum. The upper GI endoscopy was                            accomplished without difficulty. The patient                            tolerated the procedure well. Scope In: Scope Out: Findings:                 Esophagogastric landmarks were identified: the                            Z-line was found at 34 cm, the gastroesophageal                            junction was found at 34 cm and the upper extent of                            the gastric folds was found at 34 cm from the                            incisors.                           The exam of the esophagus was otherwise normal.                           A single benign appearing 6 mm papule (nodule) was                            found in the gastric antrum. Biopsies were taken                            with a cold forceps for histology.                           The exam of the stomach was otherwise normal.  Biopsies were taken with a cold forceps in the                            gastric body and in the gastric antrum for                            Helicobacter pylori testing.                           The duodenal bulb and second portion of the                            duodenum were normal. Complications:            No immediate complications. Estimated blood loss:                            Minimal. Estimated Blood Loss:     Estimated blood loss was minimal. Impression:               - Esophagogastric landmarks identified.                           - Esophagus otherwise normal                           - A single papule (nodule) found in the stomach.                            Biopsied.                           - Normal duodenal bulb and second portion of the                            duodenum.                           - Biopsies were taken with a cold forceps for                            Helicobacter pylori testing. Recommendation:           - Patient has a contact number available for                             emergencies. The signs and symptoms of potential                            delayed complications were discussed with the                            patient. Return to normal activities tomorrow.                            Written discharge instructions were provided to the  patient.                           - Resume previous diet.                           - Continue present medications.                           - Await pathology results. Remo Lipps P. Armbruster, MD 09/15/2015 2:07:31 PM This report has been signed electronically.

## 2015-09-15 NOTE — Progress Notes (Signed)
To pacu vss patent aw reprot to rn 

## 2015-09-15 NOTE — Patient Instructions (Signed)
YOU HAD AN ENDOSCOPIC PROCEDURE TODAY AT [facility]:   Refer to the procedure report that was given to you for any specific questions about what was found during the examination.  If the procedure report does not answer your questions, please call your gastroenterologist to clarify.  If you requested that your care partner not be given the details of your procedure findings, then the procedure report has been included in a sealed envelope for you to review at your convenience later.  YOU SHOULD EXPECT: Some feelings of bloating in the abdomen. Passage of more gas than usual.  Walking can help get rid of the air that was put into your GI tract during the procedure and reduce the bloating. If you had a lower endoscopy (such as a colonoscopy or flexible sigmoidoscopy) you may notice spotting of blood in your stool or on the toilet paper. If you underwent a bowel prep for your procedure, you may not have a normal bowel movement for a few days.  Please Note:  You might notice some irritation and congestion in your nose or some drainage.  This is from the oxygen used during your procedure.  There is no need for concern and it should clear up in a day or so.  SYMPTOMS TO REPORT IMMEDIATELY:   Following lower endoscopy (colonoscopy or flexible sigmoidoscopy):  Excessive amounts of blood in the stool  Significant tenderness or worsening of abdominal pains  Swelling of the abdomen that is new, acute  Fever of 100F or higher   Following upper endoscopy (EGD)  Vomiting of blood or coffee ground material  New chest pain or pain under the shoulder blades  Painful or persistently difficult swallowing  New shortness of breath  Fever of 100F or higher  Black, tarry-looking stools  For urgent or emergent issues, a gastroenterologist can be reached at any hour by calling [phone].   DIET:  We do recommend a small meal at first, but then you may proceed to your regular diet.  Drink  plenty of fluids but you should avoid alcoholic beverages for 24 hours.  ACTIVITY:  You should plan to take it easy for the rest of today and you should NOT DRIVE or use heavy machinery until tomorrow (because of the sedation medicines used during the test).    FOLLOW UP: Our staff will call the number listed on your records the next business day following your procedure to check on you and address any questions or concerns that you may have regarding the information given to you following your procedure. If we do not reach you, we will leave a message.  However, if you are feeling well and you are not experiencing any problems, there is no need to return our call.  We will assume that you have returned to your regular daily activities without incident.  If any biopsies were taken you will be contacted by phone or by letter within the next 1-3 weeks.  Please call us at [phone] if you have not heard about the biopsies in 3 weeks.    SIGNATURES/CONFIDENTIALITY: You and/or your care partner have signed paperwork which will be entered into your electronic medical record.  These signatures attest to the fact that that the information above on your After Visit Summary has been reviewed and is understood.  Full responsibility of the confidentiality of this discharge information lies with you and/or your care-partner.    Handout was given to your care partner on hemorrhoids.  Add OTC daily fiber supplement You may resume your current medications today.  You may use prilosec 2 x per day for GERD symptoms per Dr. Havery Moros. Await biopsy results. Please call if any questions or concerns.

## 2015-09-15 NOTE — Progress Notes (Signed)
Called to room to assist during endoscopic procedure.  Patient ID and intended procedure confirmed with present staff. Received instructions for my participation in the procedure from the performing physician.  

## 2015-09-15 NOTE — Progress Notes (Signed)
No problems noted in the recovery room. Maw  Pt was a little dizzy when she sat up.  I advised her to take her time getting dressed.  I sat pt up with her legs hanging off the stretcher.  Her mother was at the bedside.  Pt will dress when she is ready.  maw

## 2015-09-15 NOTE — Op Note (Signed)
[facility] Patient Name: Amy Rivas: 09/15/2015 1:23 PM MRN: [id number] Endoscopist: Remo Lipps P. Havery Moros , MD Age: 25 Referring MD:  Rivas of Birth: June 19, 1990 Gender: Female Account #: [id number] Procedure:                Colonoscopy Indications:              Evaluation of unexplained GI bleeding, Abdominal                            pain Medicines:                Monitored Anesthesia Care Procedure:                Pre-Anesthesia Assessment:                           - Prior to the procedure, a History and Physical                            was performed, and patient medications and                            allergies were reviewed. The patient's tolerance of                            previous anesthesia was also reviewed. The risks                            and benefits of the procedure and the sedation                            options and risks were discussed with the patient.                            All questions were answered, and informed consent                            was obtained. Prior Anticoagulants: The patient has                            taken no previous anticoagulant or antiplatelet                            agents. ASA Grade Assessment: III - A patient with                            severe systemic disease. After reviewing the risks                            and benefits, the patient was deemed in                            satisfactory condition to undergo the procedure.  After obtaining informed consent, the colonoscope                            was passed under direct vision. Throughout the                            procedure, the patient's blood pressure, pulse, and                            oxygen saturations were monitored continuously. The                            Model PCF-H190DL [id number]) scope was introduced                            through the anus and advanced to the the  terminal                            ileum, with identification of the appendiceal                            orifice and IC valve. The colonoscopy was performed                            without difficulty. The patient tolerated the                            procedure well. The quality of the bowel                            preparation was excellent. The terminal ileum,                            ileocecal valve, appendiceal orifice, and rectum                            were photographed. Scope In: 1:44:51 PM Scope Out: 1:57:49 PM Scope Withdrawal Time: 0 hours 10 minutes 25 seconds  Total Procedure Duration: 0 hours 12 minutes 58 seconds  Findings:                 The perianal and digital rectal examinations were                            normal.                           A localized area of mucosa with erythema and a few                            apthous ulcerations was found in the distal rectum.                            Biopsies were taken with a cold forceps for  histology.                           The terminal ileum appeared normal.                           Non-bleeding internal hemorrhoids were found during                            retroflexion.                           The exam was otherwise without abnormality. No                            other inflammatory changes were appreciated. Complications:            No immediate complications. Estimated blood loss:                            Minimal. Estimated Blood Loss:     Estimated blood loss was minimal. Impression:               - Abnormal mucosa in the distal rectum. Biopsied.                           - The examined portion of the ileum was normal.                           - Non-bleeding internal hemorrhoids.                           - The examination was otherwise normal.                           Overall, hemorrhoids may be the cause of the                            patient's  symptoms, but will await pathology                            results. Recommendation:           - Patient has a contact number available for                            emergencies. The signs and symptoms of potential                            delayed complications were discussed with the                            patient. Return to normal activities tomorrow.                            Written discharge instructions were provided to the  patient.                           - Resume previous diet.                           - Continue present medications.                           - Daily fiber supplement if not yet taking to treat                            hemorrhoids                           - Await pathology results.                           - No recommendation at this time regarding repeat                            colonoscopy. Remo Lipps P. Jadea Shiffer, MD 09/15/2015 2:04:26 PM This report has been signed electronically.

## 2015-09-16 ENCOUNTER — Telehealth: Payer: Self-pay

## 2015-09-16 NOTE — Telephone Encounter (Signed)
  Follow up Call-  Call back number 09/15/2015  Post procedure Call Back phone  # [phone]  Permission to leave phone message Yes  Some recent data might be hidden     Patient questions:  Do you have a fever, pain , or abdominal swelling? No. Pain Score  0 *  Have you tolerated food without any problems? Yes.    Have you been able to return to your normal activities? Yes.    Do you have any questions about your discharge instructions: Diet   No. Medications  No. Follow up visit  No.  Do you have questions or concerns about your Care? No.  Actions: * If pain score is 4 or above: No action needed, pain <4.

## 2015-09-21 ENCOUNTER — Encounter: Payer: Self-pay | Admitting: Gastroenterology

## 2015-09-21 ENCOUNTER — Telehealth: Payer: Self-pay

## 2015-09-21 DIAGNOSIS — G35 Multiple sclerosis: Secondary | ICD-10-CM

## 2015-09-21 NOTE — Telephone Encounter (Signed)
-----   Message from Amada Kingfisher, [place] sent at 06/08/2015  3:58 PM EDT ----- LFTs

## 2015-09-21 NOTE — Telephone Encounter (Signed)
Spoke with patient will come in to have labs drawn. Is aware that lab is closed today. Lab order placed.

## 2015-09-23 ENCOUNTER — Other Ambulatory Visit (INDEPENDENT_AMBULATORY_CARE_PROVIDER_SITE_OTHER): Payer: Medicaid Other

## 2015-09-23 DIAGNOSIS — G35 Multiple sclerosis: Secondary | ICD-10-CM

## 2015-09-23 LAB — HEPATIC FUNCTION PANEL
ALBUMIN: 4.5 g/dL (ref 3.5–5.2)
ALT: 7 U/L (ref 0–35)
AST: 12 U/L (ref 0–37)
Alkaline Phosphatase: 38 U/L — ABNORMAL LOW (ref 39–117)
Bilirubin, Direct: 0.1 mg/dL (ref 0.0–0.3)
TOTAL PROTEIN: 7 g/dL (ref 6.0–8.3)
Total Bilirubin: 0.4 mg/dL (ref 0.2–1.2)

## 2015-11-26 ENCOUNTER — Ambulatory Visit: Payer: Medicaid Other | Admitting: Neurology

## 2015-12-08 ENCOUNTER — Ambulatory Visit: Payer: Medicaid Other | Admitting: Neurology

## 2015-12-13 ENCOUNTER — Telehealth: Payer: Self-pay

## 2015-12-13 DIAGNOSIS — G35 Multiple sclerosis: Secondary | ICD-10-CM

## 2015-12-13 NOTE — Telephone Encounter (Signed)
-----   Message from Amada Kingfisher, [place] sent at 06/08/2015  3:58 PM EDT ----- CBC and LFT

## 2015-12-13 NOTE — Telephone Encounter (Signed)
Pt called back. Pt will come in first of the year. She has lost insurance. Will get drawn asap.

## 2015-12-13 NOTE — Telephone Encounter (Signed)
Attempted to reach pt no answer 

## 2016-01-19 ENCOUNTER — Other Ambulatory Visit (INDEPENDENT_AMBULATORY_CARE_PROVIDER_SITE_OTHER): Payer: BLUE CROSS/BLUE SHIELD

## 2016-01-19 DIAGNOSIS — G35 Multiple sclerosis: Secondary | ICD-10-CM

## 2016-01-19 LAB — HEPATIC FUNCTION PANEL
ALT: 14 U/L (ref 0–35)
AST: 16 U/L (ref 0–37)
Albumin: 4.6 g/dL (ref 3.5–5.2)
Alkaline Phosphatase: 40 U/L (ref 39–117)
Bilirubin, Direct: 0.1 mg/dL (ref 0.0–0.3)
Total Bilirubin: 0.4 mg/dL (ref 0.2–1.2)
Total Protein: 7 g/dL (ref 6.0–8.3)

## 2016-01-19 LAB — CBC
HEMATOCRIT: 44.3 % (ref 36.0–46.0)
Hemoglobin: 15.6 g/dL — ABNORMAL HIGH (ref 12.0–15.0)
MCHC: 35.1 g/dL (ref 30.0–36.0)
MCV: 90.9 fl (ref 78.0–100.0)
Platelets: 268 10*3/uL (ref 150.0–400.0)
RBC: 4.87 Mil/uL (ref 3.87–5.11)
RDW: 12.1 % (ref 11.5–15.5)
WBC: 7.2 10*3/uL (ref 4.0–10.5)

## 2016-02-09 ENCOUNTER — Other Ambulatory Visit (INDEPENDENT_AMBULATORY_CARE_PROVIDER_SITE_OTHER): Payer: BLUE CROSS/BLUE SHIELD

## 2016-02-09 ENCOUNTER — Encounter: Payer: Self-pay | Admitting: Neurology

## 2016-02-09 ENCOUNTER — Ambulatory Visit (INDEPENDENT_AMBULATORY_CARE_PROVIDER_SITE_OTHER): Payer: BLUE CROSS/BLUE SHIELD | Admitting: Neurology

## 2016-02-09 ENCOUNTER — Telehealth: Payer: Self-pay

## 2016-02-09 VITALS — BP 106/62 | HR 60 | Ht <= 58 in | Wt 103.4 lb

## 2016-02-09 DIAGNOSIS — G35 Multiple sclerosis: Secondary | ICD-10-CM

## 2016-02-09 LAB — CBC WITH DIFFERENTIAL/PLATELET
BASOS ABS: 0 10*3/uL (ref 0.0–0.1)
Basophils Relative: 0.2 % (ref 0.0–3.0)
EOS ABS: 0.1 10*3/uL (ref 0.0–0.7)
Eosinophils Relative: 1.3 % (ref 0.0–5.0)
HCT: 42.9 % (ref 36.0–46.0)
HEMOGLOBIN: 15.1 g/dL — AB (ref 12.0–15.0)
Lymphocytes Relative: 24.5 % (ref 12.0–46.0)
Lymphs Abs: 1.4 10*3/uL (ref 0.7–4.0)
MCHC: 35.2 g/dL (ref 30.0–36.0)
MCV: 90.5 fl (ref 78.0–100.0)
MONO ABS: 0.3 10*3/uL (ref 0.1–1.0)
Monocytes Relative: 5.9 % (ref 3.0–12.0)
NEUTROS PCT: 68.1 % (ref 43.0–77.0)
Neutro Abs: 3.9 10*3/uL (ref 1.4–7.7)
Platelets: 268 10*3/uL (ref 150.0–400.0)
RBC: 4.74 Mil/uL (ref 3.87–5.11)
RDW: 12 % (ref 11.5–15.5)
WBC: 5.7 10*3/uL (ref 4.0–10.5)

## 2016-02-09 LAB — VITAMIN D 25 HYDROXY (VIT D DEFICIENCY, FRACTURES): VITD: 21.28 ng/mL — AB (ref 30.00–100.00)

## 2016-02-09 NOTE — Telephone Encounter (Signed)
Called patient. Gave lab results. Patient verbalized understanding.  

## 2016-02-09 NOTE — Progress Notes (Signed)
NEUROLOGY FOLLOW UP OFFICE NOTE  TAHLYA AST [id number]  HISTORY OF PRESENT ILLNESS: Amy Rivas is a 26 year old right-handed woman with left sided hemiplegic migraine, menstrual migraines, GERD, tachycardia, hypertension and anxiety who follows up for multiple sclerosis.    UPDATE: MRI of thoracic spine with and without contrast from 06/16/15 was personally reviewed and was normal. She developed blistering rash on Tecfidera, so plan was to switch to Tysabri.  JCV antibody was negative.  She missed her first Tysabri infusion in August due to fear of PML and then lost her insurance, so she never followed up.  Labs from 01/19/16 include CBC with WBC 7.2, HGB 15.6, HCT 44.3, PLT 268.  A differential was not performed, so I do not have the absolute lymphocyte count; hepatic panel with total bili 0.4, ALP 40, AST 16, ALT 14.  She has physically been doing well, however.  HISTORY: She was admitted to [facility] from 05/25/15 to 05/29/15 after presenting with 2 week history of headache which did not respond to prednisone taper.  On morning of hospital admission, she developed right sided weakness and left facial droop.  She had passed out.  MRI of brain with and without contrast showed non-enhancing white matter lesions in the periventricular and subcortical white matter, which were not seen on prior brain MRI from 01/24/14.  MRI of cervical spine revealed no cord lesions.  MRA of head showed either artefactual or mild beading of the distal cervical ICA bilaterally, which may possibly be fibromuscular dysplasia.  Lumbar puncture was performed, which revealed CSF cell count of 5, protein 28, glucose 76, 3 oligoclonal bands, and IgG index 3.3.  Serum sed rate was 2 and CRP was negative.  She underwent 5 day course of IV Solu-Medrol.    A few months prior to this event, she reported blurred vision in the right eye for about 2 weeks.  There was no associated eye pain.  She saw the eye doctor and  had a normal exam.  Past disease modifying therapy:  Tecfidera (blistering rash).  Her cardiologist does not want her on Gilenya.  She has a history of hemiplegic migraine.  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  MRI and MRV of head from 01/24/14 were unremarkable.  PAST MEDICAL HISTORY: Past Medical History:  Diagnosis Date  . Anxiety   . Elevated LFTs   . GERD (gastroesophageal reflux disease)   . Headache   . Hemangioma    as a child; had liver biopsy but unsure of results  . MS (multiple sclerosis) ([place]) 05/2015  . Neuromuscular disorder ([place])    MS  . Ovarian cyst   . PSVT (paroxysmal supraventricular tachycardia) ([place])   . Vitamin D deficiency     MEDICATIONS: Current Outpatient Prescriptions on File Prior to Visit  Medication Sig Dispense Refill  . acetaminophen (TYLENOL) 500 MG tablet Take 1,000 mg by mouth every 6 (six) hours as needed for fever.    Marland Kitchen ibuprofen (ADVIL,MOTRIN) 200 MG tablet Take 200 mg by mouth every 6 (six) hours as needed.    . metoprolol tartrate (LOPRESSOR) 25 MG tablet Take 25 mg by mouth 2 (two) times daily.     Current Facility-Administered Medications on File Prior to Visit  Medication Dose Route Frequency Provider Last Rate Last Dose  . 0.9 %  sodium chloride infusion  500 mL Intravenous Continuous Renelda Loma  Armbruster, MD        ALLERGIES: Allergies  Allergen Reactions  . Albuterol Anaphylaxis  . Hydrocodone Anaphylaxis and Nausea And Vomiting  . Codeine Nausea And Vomiting  . Lortab [Hydrocodone-Acetaminophen] Nausea And Vomiting    Patient can tolerate acetaminophen solely  . Morphine And Related Swelling and Rash  . Tecfidera [Dimethyl Fumarate] Hives and Swelling    FAMILY HISTORY: Family History  Problem Relation Age of Onset  . Heart disease Mother   . Diabetes Father   . Lung cancer Maternal Grandmother   . Diabetes  Maternal Grandmother   . Asthma Maternal Grandmother   . Breast cancer Maternal Grandmother   . Cancer Maternal Uncle     type unknown  . Diabetes Maternal Grandfather   . Diabetes Paternal Grandmother   . Diabetes Paternal Grandfather     SOCIAL HISTORY: Social History   Social History  . Marital status: Married    Spouse name: N/A  . Number of children: 2  . Years of education: N/A   Occupational History  . stay at home mom    Social History Main Topics  . Smoking status: Never Smoker  . Smokeless tobacco: Never Used  . Alcohol use No  . Drug use: No  . Sexual activity: Yes    Partners: Male    Birth control/ protection: Other-see comments   Other Topics Concern  . Not on file   Social History Narrative   ** Merged History Encounter **        REVIEW OF SYSTEMS: Constitutional: No fevers, chills, or sweats, no generalized fatigue, change in appetite Eyes: No visual changes, double vision, eye pain Ear, nose and throat: No hearing loss, ear pain, nasal congestion, sore throat Cardiovascular: No chest pain, palpitations Respiratory:  No shortness of breath at rest or with exertion, wheezes GastrointestinaI: No nausea, vomiting, diarrhea, abdominal pain, fecal incontinence Genitourinary:  No dysuria, urinary retention or frequency Musculoskeletal:  No neck pain, back pain Integumentary: No rash, pruritus, skin lesions Neurological: as above Psychiatric: No depression, insomnia, anxiety Endocrine: No palpitations, fatigue, diaphoresis, mood swings, change in appetite, change in weight, increased thirst Hematologic/Lymphatic:  No purpura, petechiae. Allergic/Immunologic: no itchy/runny eyes, nasal congestion, recent allergic reactions, rashes  PHYSICAL EXAM: Vitals:   02/09/16 0918  BP: 106/62  Pulse: 60   General: No acute distress.  Patient appears well-groomed.  normal body habitus. Head:  Normocephalic/atraumatic Eyes:  Fundi examined but not  visualized Neck: supple, no paraspinal tenderness, full range of motion Heart:  Regular rate and rhythm Lungs:  Clear to auscultation bilaterally Back: No paraspinal tenderness Neurological Exam: alert and oriented to person, place, and time. Attention span and concentration intact, recent and remote memory intact, fund of knowledge intact.  Speech fluent and not dysarthric, language intact.  CN II-XII intact. Bulk and tone normal, muscle strength 5/5 throughout.  Sensation to light touch  intact.  Deep tendon reflexes 3+ right patellar, otherwise  2+ throughout.  Finger to nose testing intact.  Gait normal, Romberg negative.  Timed 25 foot test 5.98 seconds.  IMPRESSION: Relapsing remitting MS  PLAN: 1.  She asked if we can just monitor for now.  After extensive discussion of why I believe she needs to be on a disease modifying drug and discussed other medication options, as well as discussing risks of PML with Tysabri, she would like to try the Tysabri. 2.  We will recheck JC virus antibody titer with index and Vitamin D 3.  We will repeat hepatic panel and JC Virus antibody and index in 6 months after first dose of Tysabri. 4.  Follow up with me after repeat labs.  26 minutes spent face to face with patient, over 50% spent discussingwhy I believe she needs to be on a disease modifying drug and discussed other medication options, as well as discussing risks of PML with Tysabri, .  Metta Clines, DO  CC:  Eloise Levels, MD

## 2016-02-09 NOTE — Telephone Encounter (Signed)
-----   Message from Pieter Partridge, DO sent at 02/09/2016 12:45 PM EST ----- I would like her to start D3 4000 IU daily.  We can recheck level in 6 months prior to visit.

## 2016-02-09 NOTE — Patient Instructions (Signed)
1.  We will set you up for Tysabri again.  I will need to recheck CBC with diff.  Also JCV antibody with index and vitamin D. 2.  After first dose, repeat LFTs and JCV antibody with index in 6 months. 3.  Follow up in 6 months after repeat testing.

## 2016-02-16 LAB — STRATIFY JCV AB (W/ INDEX) W/ RFLX
INDEX VALUE: 0.11
JCV Antibody: NEGATIVE

## 2016-02-18 ENCOUNTER — Other Ambulatory Visit: Payer: Self-pay | Admitting: Family Medicine

## 2016-02-18 ENCOUNTER — Telehealth: Payer: Self-pay

## 2016-02-18 ENCOUNTER — Ambulatory Visit
Admission: RE | Admit: 2016-02-18 | Discharge: 2016-02-18 | Disposition: A | Discharge status: Home / Self Care | Payer: BLUE CROSS/BLUE SHIELD | Source: Ambulatory Visit | Attending: Family Medicine | Admitting: Family Medicine

## 2016-02-18 DIAGNOSIS — M549 Dorsalgia, unspecified: Secondary | ICD-10-CM

## 2016-02-18 DIAGNOSIS — M545 Low back pain: Secondary | ICD-10-CM

## 2016-02-18 DIAGNOSIS — G35 Multiple sclerosis: Secondary | ICD-10-CM

## 2016-02-18 NOTE — Telephone Encounter (Signed)
Called patient to give lab results and instructions. No answer. Will try later.  Will have patient to sign Tysabri papers.

## 2016-02-18 NOTE — Telephone Encounter (Signed)
-----   Message from Pieter Partridge, DO sent at 02/17/2016  7:00 AM EST ----- Her JC virus antibody titer is negative.  We can set her up to initiate Tysabri.  I would like to see her in 6 months after her first dose (checking cbc with diff, hepatic panel and JC virus antibody with index about a week prior to follow up visit).  Thank you.

## 2016-02-21 NOTE — Telephone Encounter (Signed)
Called patient.  No answer.

## 2016-02-22 NOTE — Telephone Encounter (Signed)
This encounter was created in error - please disregard.

## 2016-02-22 NOTE — Telephone Encounter (Signed)
No answer. Sent myChart message.

## 2016-03-30 ENCOUNTER — Encounter (HOSPITAL_COMMUNITY): Payer: Self-pay | Admitting: Emergency Medicine

## 2016-03-30 ENCOUNTER — Emergency Department (HOSPITAL_COMMUNITY): Payer: BLUE CROSS/BLUE SHIELD

## 2016-03-30 ENCOUNTER — Emergency Department (HOSPITAL_COMMUNITY)
Admission: EM | Admit: 2016-03-30 | Discharge: 2016-03-30 | Disposition: A | Discharge status: Home / Self Care | Payer: BLUE CROSS/BLUE SHIELD | Attending: Emergency Medicine | Admitting: Emergency Medicine

## 2016-03-30 DIAGNOSIS — R0602 Shortness of breath: Secondary | ICD-10-CM | POA: Insufficient documentation

## 2016-03-30 DIAGNOSIS — J040 Acute laryngitis: Secondary | ICD-10-CM | POA: Diagnosis not present

## 2016-03-30 DIAGNOSIS — R490 Dysphonia: Secondary | ICD-10-CM | POA: Diagnosis present

## 2016-03-30 LAB — BASIC METABOLIC PANEL
Anion gap: 13 (ref 5–15)
BUN: 12 mg/dL (ref 6–20)
CO2: 21 mmol/L — ABNORMAL LOW (ref 22–32)
CREATININE: 0.77 mg/dL (ref 0.44–1.00)
Calcium: 9.6 mg/dL (ref 8.9–10.3)
Chloride: 104 mmol/L (ref 101–111)
GFR calc Af Amer: >60 mL/min (ref >60)
GFR calc non Af Amer: >60 mL/min (ref >60)
GLUCOSE: 92 mg/dL (ref 65–99)
POTASSIUM: 3.8 mmol/L (ref 3.5–5.1)
Sodium: 138 mmol/L (ref 135–145)

## 2016-03-30 LAB — CBC WITH DIFFERENTIAL/PLATELET
Basophils Absolute: 0 10*3/uL (ref 0.0–0.1)
Basophils Relative: 0 %
EOS ABS: 0.1 10*3/uL (ref 0.0–0.7)
EOS PCT: 1 %
HCT: 44.1 % (ref 36.0–46.0)
Hemoglobin: 15.7 g/dL — ABNORMAL HIGH (ref 12.0–15.0)
LYMPHS ABS: 1.9 10*3/uL (ref 0.7–4.0)
LYMPHS PCT: 20 %
MCH: 31.7 pg (ref 26.0–34.0)
MCHC: 35.6 g/dL (ref 30.0–36.0)
MCV: 88.9 fL (ref 78.0–100.0)
MONO ABS: 0.8 10*3/uL (ref 0.1–1.0)
MONOS PCT: 9 %
Neutro Abs: 6.8 10*3/uL (ref 1.7–7.7)
Neutrophils Relative %: 70 %
PLATELETS: 230 10*3/uL (ref 150–400)
RBC: 4.96 MIL/uL (ref 3.87–5.11)
RDW: 11.9 % (ref 11.5–15.5)
WBC: 9.6 10*3/uL (ref 4.0–10.5)

## 2016-03-30 MED ORDER — SODIUM CHLORIDE 0.9 % IV BOLUS (SEPSIS)
1000.0000 mL | Freq: Once | INTRAVENOUS | Status: AC
  Administered 2016-03-30: 1000 mL via INTRAVENOUS

## 2016-03-30 MED ORDER — DEXAMETHASONE SODIUM PHOSPHATE 10 MG/ML IJ SOLN
10.0000 mg | Freq: Once | INTRAMUSCULAR | Status: AC
  Administered 2016-03-30: 10 mg via INTRAVENOUS
  Filled 2016-03-30: qty 1

## 2016-03-30 NOTE — ED Notes (Signed)
Went to lobby to retreive pt, is at [employer] currently, will be brought back to D30 after imaging completed.

## 2016-03-30 NOTE — ED Notes (Signed)
Patient presents to ed c/o sorethroat and tightness in her throat onset Monday , States her throat feels tight she is able to swallow , c/o nasal congestion.

## 2016-03-30 NOTE — ED Notes (Signed)
Patient transported to X-ray 

## 2016-03-30 NOTE — ED Provider Notes (Signed)
[facility] DEPT Provider Note   CSN: [id number] Arrival date & time: 03/30/16  0422     History   Chief Complaint Chief Complaint  Patient presents with  . Shortness of Breath    HPI Amy Rivas is a 26 y.o. female.  Patient with history of MS presents with complaint of voice change/hoarseness that affects her breathing. No fever, cough, sore throat, vomiting. She has upper back pain that is causing SOB secondary back pain, however, this is ongoing and is being evaluated by her PCP with pending thoracic imaging. Hoarseness is new for several days. No difficulty swallowing solids or liquids. No aggravating or alleviating factors to throat symptoms.    The history is provided by the patient. No language interpreter was used.  Shortness of Breath  Pertinent negatives include no fever, no headaches, no neck pain, no cough, no chest pain and no vomiting.    Past Medical History:  Diagnosis Date  . Anxiety   . Elevated LFTs   . GERD (gastroesophageal reflux disease)   . Headache   . Hemangioma    as a child; had liver biopsy but unsure of results  . MS (multiple sclerosis) ([place]) 05/2015  . Neuromuscular disorder ([place])    MS  . Ovarian cyst   . PSVT (paroxysmal supraventricular tachycardia) ([place])   . Vitamin D deficiency     Patient Active Problem List   Diagnosis Date Noted  . Multiple sclerosis exacerbation ([place]) 05/25/2015  . Acute right-sided weakness 05/25/2015  . Intractable headache 05/25/2015  . Adjustment disorder with anxious mood   . Solitary pulmonary nodule 12/21/2013  . Dyspnea 12/18/2013  . Elevated LFTs 04/15/2013  . Vomiting 04/14/2013  . Abdominal pain 04/14/2013  . Sinus tachycardia ([place]) 04/14/2013  . Nipple discharge 11/21/2012    Past Surgical History:  Procedure Laterality Date  . CESAREAN SECTION  2010, 2012  . LAPAROSCOPIC CHOLECYSTECTOMY  2008  . TONSILLECTOMY AND ADENOIDECTOMY  1992  . TYMPANOSTOMY [place],  2003    OB History    Gravida Para Term Preterm AB Living   2 2   2        SAB TAB Ectopic Multiple Live Births                   Home Medications    Prior to Admission medications   Medication Sig Start Date End Date Taking? Authorizing Provider  acetaminophen (TYLENOL) 500 MG tablet Take 1,000 mg by mouth every 6 (six) hours as needed for fever.    Historical Provider, MD  cyclobenzaprine (FLEXERIL) 5 MG tablet Take 1 tablet by mouth every 8 (eight) hours. 01/24/16   Historical Provider, MD  ibuprofen (ADVIL,MOTRIN) 200 MG tablet Take 200 mg by mouth every 6 (six) hours as needed.    Historical Provider, MD  metoprolol tartrate (LOPRESSOR) 25 MG tablet Take 25 mg by mouth 2 (two) times daily. 05/03/15   Historical Provider, MD    Family History Family History  Problem Relation Age of Onset  . Heart disease Mother   . Diabetes Father   . Lung cancer Maternal Grandmother   . Diabetes Maternal Grandmother   . Asthma Maternal Grandmother   . Breast cancer Maternal Grandmother   . Cancer Maternal Uncle     type unknown  . Diabetes Maternal Grandfather   . Diabetes Paternal Grandmother   . Diabetes Paternal Grandfather     Social History Social History  Substance Use Topics  .  Smoking status: Never Smoker  . Smokeless tobacco: Never Used  . Alcohol use No     Allergies   Albuterol; Hydrocodone; Codeine; Lortab [hydrocodone-acetaminophen]; Morphine and related; and Tecfidera [dimethyl fumarate]   Review of Systems Review of Systems  Constitutional: Negative for appetite change and fever.  HENT: Positive for voice change.        See HPI.  Respiratory: Positive for shortness of breath. Negative for cough and chest tightness.   Cardiovascular: Negative for chest pain.  Gastrointestinal: Negative for nausea and vomiting.  Musculoskeletal: Negative for neck pain and neck stiffness.  Neurological: Negative for headaches.     Physical Exam Updated Vital Signs Pulse  (!) 128   Temp 97.8 F (36.6 C) (Oral)   Resp 22   Ht 4\' 8"  (1.422 m)   Wt 46.3 kg   LMP 03/02/2016   SpO2 100%   BMI 22.87 kg/m   Physical Exam  Constitutional: She is oriented to person, place, and time. She appears well-developed and well-nourished.  HENT:  Head: Normocephalic.  Stridulous breath sounds felt tracheal or laryngeal. No difficulty swallowing. No hypoxia, tachypnea, or tachycardia. Oropharynx is benign.    Neck: Normal range of motion. Neck supple.  Cardiovascular: Normal rate and regular rhythm.   No murmur heard. Pulmonary/Chest: Effort normal and breath sounds normal. She has no wheezes. She has no rales.  Abdominal: Soft. Bowel sounds are normal. There is no tenderness. There is no rebound and no guarding.  Musculoskeletal: Normal range of motion.  Neurological: She is alert and oriented to person, place, and time.  Skin: Skin is warm and dry. No rash noted.  Psychiatric: She has a normal mood and affect.     ED Treatments / Results  Labs (all labs ordered are listed, but only abnormal results are displayed) Labs Reviewed  CBC WITH DIFFERENTIAL/PLATELET - Abnormal; Notable for the following:       Result Value   Hemoglobin 15.7 (*)    All other components within normal limits  BASIC METABOLIC PANEL - Abnormal; Notable for the following:    CO2 21 (*)    All other components within normal limits    EKG  EKG Interpretation None       Radiology Dg Chest 2 View  Result Date: 03/30/2016 CLINICAL DATA:  Shortness of breath, sore throat, and cough since last Monday but worse today. EXAM: CHEST  2 VIEW COMPARISON:  Chest 10/07/2013.  CT chest 12/18/2013. FINDINGS: The heart size and mediastinal contours are within normal limits. Both lungs are clear. The visualized skeletal structures are unremarkable. Surgical clips in the right upper quadrant. IMPRESSION: No active cardiopulmonary disease. Electronically Signed   By: Lucienne Capers M.D.   On:  03/30/2016 04:58    Procedures Procedures (including critical care time)  Medications Ordered in ED Medications - No data to display   Initial Impression / Assessment and Plan / ED Course  I have reviewed the triage vital signs and the nursing notes.  Pertinent labs & imaging results that were available during my care of the patient were reviewed by me and considered in my medical decision making (see chart for details).     Patient with symptoms of hoarse voice and feels like its hard to breathe secondary to throat tightness. No hypoxia, fever. Plain film of neck negative for epiglottic edema. Decadron provided and patient reports she is feeling better. She appears comfortable. She can be discharged home and recommended to follow up with PCP  in 1-2 days for recheck. Patient is comfortable with discharge home.   Final Clinical Impressions(s) / ED Diagnoses   Final diagnoses:  None   1. Laryngitis   New Prescriptions New Prescriptions   No medications on file     Charlann Lange, Hershal Coria 04/04/16 0149    Fatima Blank, MD 04/05/16 [phone]

## 2016-03-30 NOTE — ED Notes (Signed)
Patient states she feels much better her doesn't feel as tight.

## 2016-03-30 NOTE — ED Triage Notes (Signed)
Pt c/o of SOB since last Monday, getting worse today. Pt denies cp, fever nausea or chills.

## 2016-04-01 ENCOUNTER — Emergency Department (HOSPITAL_COMMUNITY): Payer: BLUE CROSS/BLUE SHIELD

## 2016-04-01 ENCOUNTER — Encounter (HOSPITAL_COMMUNITY): Payer: Self-pay

## 2016-04-01 ENCOUNTER — Emergency Department (HOSPITAL_COMMUNITY)
Admission: EM | Admit: 2016-04-01 | Discharge: 2016-04-01 | Disposition: A | Discharge status: Home / Self Care | Payer: BLUE CROSS/BLUE SHIELD | Attending: Emergency Medicine | Admitting: Emergency Medicine

## 2016-04-01 DIAGNOSIS — R0602 Shortness of breath: Secondary | ICD-10-CM

## 2016-04-01 DIAGNOSIS — J040 Acute laryngitis: Secondary | ICD-10-CM | POA: Diagnosis not present

## 2016-04-01 LAB — BASIC METABOLIC PANEL
ANION GAP: 10 (ref 5–15)
BUN: 8 mg/dL (ref 6–20)
CO2: 17 mmol/L — AB (ref 22–32)
Calcium: 9.5 mg/dL (ref 8.9–10.3)
Chloride: 111 mmol/L (ref 101–111)
Creatinine, Ser: 0.64 mg/dL (ref 0.44–1.00)
GFR calc Af Amer: >60 mL/min (ref >60)
GLUCOSE: 127 mg/dL — AB (ref 65–99)
Potassium: 3.5 mmol/L (ref 3.5–5.1)
Sodium: 138 mmol/L (ref 135–145)

## 2016-04-01 LAB — I-STAT BETA HCG BLOOD, ED (MC, WL, AP ONLY): I-stat hCG, quantitative: <5 m[IU]/mL (ref <5)

## 2016-04-01 LAB — CBC
HEMATOCRIT: 39.7 % (ref 36.0–46.0)
HEMOGLOBIN: 14.3 g/dL (ref 12.0–15.0)
MCH: 31.3 pg (ref 26.0–34.0)
MCHC: 36 g/dL (ref 30.0–36.0)
MCV: 86.9 fL (ref 78.0–100.0)
PLATELETS: 255 10*3/uL (ref 150–400)
RBC: 4.57 MIL/uL (ref 3.87–5.11)
RDW: 12 % (ref 11.5–15.5)
WBC: 9.7 10*3/uL (ref 4.0–10.5)

## 2016-04-01 MED ORDER — SODIUM CHLORIDE 0.9 % IV SOLN
INTRAVENOUS | Status: DC
  Administered 2016-04-01: 03:00:00 via INTRAVENOUS

## 2016-04-01 MED ORDER — ALBUTEROL SULFATE (2.5 MG/3ML) 0.083% IN NEBU
INHALATION_SOLUTION | RESPIRATORY_TRACT | Status: AC
  Filled 2016-04-01: qty 6

## 2016-04-01 MED ORDER — FAMOTIDINE IN NACL 20-0.9 MG/50ML-% IV SOLN
20.0000 mg | Freq: Once | INTRAVENOUS | Status: AC
  Administered 2016-04-01: 20 mg via INTRAVENOUS
  Filled 2016-04-01: qty 50

## 2016-04-01 MED ORDER — IOPAMIDOL (ISOVUE-300) INJECTION 61%: 100.0000 mL | Freq: Once | INTRAVENOUS | Status: DC | PRN

## 2016-04-01 MED ORDER — DEXAMETHASONE SODIUM PHOSPHATE 10 MG/ML IJ SOLN
10.0000 mg | Freq: Once | INTRAMUSCULAR | Status: AC
  Administered 2016-04-01: 10 mg via INTRAVENOUS
  Filled 2016-04-01: qty 1

## 2016-04-01 MED ORDER — DIPHENHYDRAMINE HCL 50 MG/ML IJ SOLN
25.0000 mg | Freq: Once | INTRAMUSCULAR | Status: AC
  Administered 2016-04-01: 25 mg via INTRAVENOUS
  Filled 2016-04-01: qty 1

## 2016-04-01 MED ORDER — RACEPINEPHRINE HCL 2.25 % IN NEBU
0.5000 mL | INHALATION_SOLUTION | Freq: Once | RESPIRATORY_TRACT | Status: AC
  Administered 2016-04-01: 0.5 mL via RESPIRATORY_TRACT

## 2016-04-01 MED ORDER — PREDNISONE 10 MG (21) PO TBPK: ORAL_TABLET | ORAL | 0 refills | Status: DC

## 2016-04-01 NOTE — Discharge Instructions (Signed)
Follow up with your primary care doctor, consider seeing an ENT doctor, return as needed for worsening symptoms

## 2016-04-01 NOTE — Progress Notes (Deleted)
Pt states allergic go albuterol. Reaction is anaphylactic shock.

## 2016-04-01 NOTE — ED Notes (Signed)
Pt vomited x 3 episodes.  Moderate amounts of yellow sputum.  Pt given ice chips, cold cloth, fresh emesis bag after each episode.  Pt states she feels it is just "junk" coming up and doesn't feel she needs nausea medication

## 2016-04-01 NOTE — Progress Notes (Signed)
Related to MD Tomi Bamberger concerning my findings. Pre-treatment of Racemic noted, upper airway coarse aeration with faint late inspiratory stridor. Pt is stable at this time. RT at bedside throughout the duration of the nebulizer.

## 2016-04-01 NOTE — ED Provider Notes (Signed)
[facility] DEPT Provider Note   CSN: [id number] Arrival date & time: 04/01/16  0159   By signing my name below, I, Dolores Hoose, attest that this documentation has been prepared under the direction and in the presence of Dorie Rank, MD . Electronically Signed: Dolores Hoose, Scribe. 04/01/2016. 2:45 AM.  History   Chief Complaint Chief Complaint  Patient presents with  . Shortness of Breath   The history is provided by the patient. No language interpreter was used.   HPI Comments:  Amy Rivas is a 26 y.o. female with pmhx of MS who presents to the Emergency Department complaining of constant, worsening SOB which began a few days ago. Pt was seen two days ago here in the ED for similar symptoms and was given steroids. She followed up with her doctor yesterday who told her that if her symptoms exacerbated, to return to the ED. Pt reports associated chest tightness, cough, sore throat, and fever (tmax 101). She has been compliant with her prescribed medications with minimal relief. Pt denies any vomiting.   Past Medical History:  Diagnosis Date  . Anxiety   . Elevated LFTs   . GERD (gastroesophageal reflux disease)   . Headache   . Hemangioma    as a child; had liver biopsy but unsure of results  . MS (multiple sclerosis) ([place]) 05/2015  . Neuromuscular disorder ([place])    MS  . Ovarian cyst   . PSVT (paroxysmal supraventricular tachycardia) ([place])   . Vitamin D deficiency     Patient Active Problem List   Diagnosis Date Noted  . Multiple sclerosis exacerbation ([place]) 05/25/2015  . Acute right-sided weakness 05/25/2015  . Intractable headache 05/25/2015  . Adjustment disorder with anxious mood   . Solitary pulmonary nodule 12/21/2013  . Dyspnea 12/18/2013  . Elevated LFTs 04/15/2013  . Vomiting 04/14/2013  . Abdominal pain 04/14/2013  . Sinus tachycardia ([place]) 04/14/2013  . Nipple discharge 11/21/2012    Past Surgical History:  Procedure Laterality Date  .  CESAREAN SECTION  2010, 2012  . LAPAROSCOPIC CHOLECYSTECTOMY  2008  . TONSILLECTOMY AND ADENOIDECTOMY  1992  . TYMPANOSTOMY [place], 2003    OB History    Gravida Para Term Preterm AB Living   2 2   2        SAB TAB Ectopic Multiple Live Births                   Home Medications    Prior to Admission medications   Medication Sig Start Date End Date Taking? Authorizing Provider  acetaminophen (TYLENOL) 500 MG tablet Take 1,000 mg by mouth every 6 (six) hours as needed for fever.   Yes Historical Provider, MD  ibuprofen (ADVIL,MOTRIN) 200 MG tablet Take 200 mg by mouth every 6 (six) hours as needed for moderate pain.    Yes Historical Provider, MD  metoprolol tartrate (LOPRESSOR) 25 MG tablet Take 25 mg by mouth 2 (two) times daily. 05/03/15  Yes Historical Provider, MD  predniSONE (STERAPRED UNI-PAK 21 TAB) 10 MG (21) TBPK tablet Take 6 tabs by mouth daily  for 2 days, then 5 tabs for 2 days, then 4 tabs for 2 days, then 3 tabs for 2 days, 2 tabs for 2 days, then 1 tab by mouth daily for 2 days 04/01/16   Dorie Rank, MD    Family History Family History  Problem Relation Age of Onset  . Heart disease Mother   . Diabetes Father   .  Lung cancer Maternal Grandmother   . Diabetes Maternal Grandmother   . Asthma Maternal Grandmother   . Breast cancer Maternal Grandmother   . Cancer Maternal Uncle     type unknown  . Diabetes Maternal Grandfather   . Diabetes Paternal Grandmother   . Diabetes Paternal Grandfather     Social History Social History  Substance Use Topics  . Smoking status: Never Smoker  . Smokeless tobacco: Never Used  . Alcohol use No     Allergies   Albuterol; Hydrocodone; Codeine; Lortab [hydrocodone-acetaminophen]; Morphine and related; and Tecfidera [dimethyl fumarate]   Review of Systems Review of Systems  Constitutional: Positive for fever.  HENT: Positive for sore throat.   Respiratory: Positive for cough, chest tightness and  shortness of breath.   Gastrointestinal: Negative for vomiting.  All other systems reviewed and are negative.    Physical Exam Updated Vital Signs BP 133/81   Pulse 92   Temp 97.6 F (36.4 C) (Oral)   Resp 12   LMP 03/02/2016   SpO2 98%   Physical Exam  Constitutional: She appears well-developed and well-nourished. No distress.  HENT:  Head: Normocephalic and atraumatic.  Right Ear: External ear normal.  Left Ear: External ear normal.  Mouth/Throat: Oropharynx is clear and moist. No trismus in the jaw. No uvula swelling.  Eyes: Conjunctivae are normal. Right eye exhibits no discharge. Left eye exhibits no discharge. No scleral icterus.  Neck: Neck supple. No tracheal deviation present.  Cardiovascular: Normal rate, regular rhythm and intact distal pulses.   Pulmonary/Chest: Effort normal and breath sounds normal. No stridor. No respiratory distress. She has no wheezes. She has no rales.  Intermittent stridorous breath sounds. Occasional barking cough. Hoarse voice.   Abdominal: Soft. Bowel sounds are normal. She exhibits no distension. There is no tenderness. There is no rebound and no guarding.  Musculoskeletal: She exhibits no edema or tenderness.  Neurological: She is alert. She has normal strength. No cranial nerve deficit (no facial droop, extraocular movements intact, no slurred speech) or sensory deficit. She exhibits normal muscle tone. She displays no seizure activity. Coordination normal.  Skin: Skin is warm and dry. No rash noted.  Psychiatric: She has a normal mood and affect.  Nursing note and vitals reviewed.    ED Treatments / Results  DIAGNOSTIC STUDIES:  Oxygen Saturation is 98% on [place], normal by my interpretation.    COORDINATION OF CARE:  2:55 AM Discussed treatment plan with pt at bedside which includes imaging and pt agreed to plan.  Labs (all labs ordered are listed, but only abnormal results are displayed) Labs Reviewed  BASIC METABOLIC PANEL -  Abnormal; Notable for the following:       Result Value   CO2 17 (*)    Glucose, Bld 127 (*)    All other components within normal limits  CBC  I-STAT BETA HCG BLOOD, ED ([facility], [facility], [facility] ONLY)    EKG  EKG Interpretation  Date/Time:  Saturday April 01 2016 02:19:45 EDT Ventricular Rate:  114 PR Interval:  126 QRS Duration: 78 QT Interval:  338 QTC Calculation: 465 R Axis:   66 Text Interpretation:  Sinus tachycardia Otherwise normal ECG When compared with ECG of 03/30/2016, Nonspecific ST abnormality is no longer present Confirmed by [facility]  MD, DAVID ([id number]) on 04/01/2016 2:24:32 AM       Radiology Ct Soft Tissue Neck W Contrast  Result Date: 04/01/2016 CLINICAL DATA:  Shortness of breath and cough. Difficulty breathing. EXAM: CT  NECK WITH CONTRAST TECHNIQUE: Multidetector CT imaging of the neck was performed using the standard protocol following the bolus administration of intravenous contrast. CONTRAST:  75 mL Isovue-300 COMPARISON:  soft tissue neck radiograph 03/30/2016 FINDINGS: Pharynx and larynx: The nasopharynx is clear. The oropharynx and hypopharynx are normal. The epiglottis, supraglottic larynx, glottis and subglottic larynx are normal. No retropharyngeal collection. The parapharyngeal spaces are preserved. The visible oral cavity and base of tongue are normal. Salivary glands: The parotid and submandibular glands are normal. No sialolithiasis or salivary ductal dilatation. Thyroid: Small hypodensities in the right thyroid lobe. Lymph nodes: No enlarged or abnormal density lymph nodes. Vascular: Normal. Limited intracranial: Normal. Visualized orbits: Normal. Mastoids and visualized paranasal sinuses: No advanced mucosal thickening or fluid levels. Skeleton: There is no bony spinal canal stenosis. No lytic or blastic lesions. Upper chest: 3 mm right lung apex nodule.  Otherwise normal. Other: None. IMPRESSION: 1. No acute abnormality of the neck. 2. 3 mm right upper lobe pulmonary  nodule. No follow-up needed if patient is low-risk. Non-contrast chest CT can be considered in 12 months if patient is high-risk. This recommendation follows the consensus statement: Guidelines for Management of Incidental Pulmonary Nodules Detected on CT Images: From the Fleischner Society 2017; Radiology 2017; 284:228-243. Electronically Signed   By: Ulyses Jarred M.D.   On: 04/01/2016 04:10   Dg Chest Port 1 View  Result Date: 04/01/2016 CLINICAL DATA:  Patient was seen here yesterday for increased shortness of breath and cough and was given steroids for follow-up today. Now having difficulty breathing. EXAM: PORTABLE CHEST 1 VIEW COMPARISON:  03/30/2016 FINDINGS: The heart size and mediastinal contours are within normal limits. Both lungs are clear. The visualized skeletal structures are unremarkable. IMPRESSION: No active disease. Electronically Signed   By: Lucienne Capers M.D.   On: 04/01/2016 02:59    Procedures Procedures (including critical care time)  Medications Ordered in ED Medications  albuterol (PROVENTIL) (2.5 MG/3ML) 0.083% nebulizer solution (  Not Given 04/01/16 0219)  0.9 %  sodium chloride infusion ( Intravenous Stopped 04/01/16 0430)  iopamidol (ISOVUE-300) 61 % injection 100 mL (not administered)  dexamethasone (DECADRON) injection 10 mg (10 mg Intravenous Given 04/01/16 0329)  Racepinephrine HCl 2.25 % nebulizer solution 0.5 mL (0.5 mLs Nebulization Given 04/01/16 0319)  diphenhydrAMINE (BENADRYL) injection 25 mg (25 mg Intravenous Given by Other 04/01/16 0530)  famotidine (PEPCID) IVPB 20 mg premix (0 mg Intravenous Stopped 04/01/16 0600)     Initial Impression / Assessment and Plan / ED Course  I have reviewed the triage vital signs and the nursing notes.  Pertinent labs & imaging results that were available during my care of the patient were reviewed by me and considered in my medical decision making (see chart for details).  Clinical Course as of Apr 01 699  Sat  Apr 01, 2016  0509 Pt is feeling better.  She ultimately would like to go home.  She feels as if she will be able to.  Breathing is much improved.  Will monitor a little longer.  [JK]  [id number] Feeling much better at this time.  Breathing easily.  Feels ready to go home.  [JK]    Clinical Course User Index [JK] Dorie Rank, MD   Labs and CT reassuring.  No abscess.  No epiglotitis. Suspect viral laryngitis causing mild stridor earlier.  That has significantly improved at this point.  Will dc home with steroids.  Consider ENT follow up   Final Clinical Impressions(s) /  ED Diagnoses   Final diagnoses:  Laryngitis    New Prescriptions New Prescriptions   PREDNISONE (STERAPRED UNI-PAK 21 TAB) 10 MG (21) TBPK TABLET    Take 6 tabs by mouth daily  for 2 days, then 5 tabs for 2 days, then 4 tabs for 2 days, then 3 tabs for 2 days, 2 tabs for 2 days, then 1 tab by mouth daily for 2 days  I personally performed the services described in this documentation, which was scribed in my presence.  The recorded information has been reviewed and is accurate.     Dorie Rank, MD 04/01/16 [phone]

## 2016-04-01 NOTE — Progress Notes (Signed)
Pt states allergic to albuterol. Reaction is anaphylactic shock.

## 2016-04-01 NOTE — ED Triage Notes (Signed)
Pt states was seen her yesterday for increased SOB and cough, given steroids and followed up with doctor today,  Pt now having difficulty breathing, pt speaks in broke sentences, rhonchi all over.

## 2016-07-04 ENCOUNTER — Encounter: Payer: Self-pay | Admitting: *Deleted

## 2016-07-05 ENCOUNTER — Ambulatory Visit (INDEPENDENT_AMBULATORY_CARE_PROVIDER_SITE_OTHER): Payer: BLUE CROSS/BLUE SHIELD | Admitting: Neurology

## 2016-07-05 ENCOUNTER — Encounter: Payer: Self-pay | Admitting: Neurology

## 2016-07-05 VITALS — BP 123/87 | HR 83 | Resp 16 | Ht <= 58 in | Wt 108.5 lb

## 2016-07-05 DIAGNOSIS — R7989 Other specified abnormal findings of blood chemistry: Secondary | ICD-10-CM

## 2016-07-05 DIAGNOSIS — R000 Tachycardia, unspecified: Secondary | ICD-10-CM | POA: Diagnosis not present

## 2016-07-05 DIAGNOSIS — G35 Multiple sclerosis: Secondary | ICD-10-CM

## 2016-07-05 DIAGNOSIS — R945 Abnormal results of liver function studies: Secondary | ICD-10-CM

## 2016-07-05 DIAGNOSIS — G43409 Hemiplegic migraine, not intractable, without status migrainosus: Secondary | ICD-10-CM

## 2016-07-05 DIAGNOSIS — Z79899 Other long term (current) drug therapy: Secondary | ICD-10-CM

## 2016-07-05 MED ORDER — VITAMIN D (ERGOCALCIFEROL) 1.25 MG (50000 UNIT) PO CAPS: 50000.0000 [IU] | ORAL_CAPSULE | ORAL | 0 refills | Status: DC

## 2016-07-05 NOTE — Progress Notes (Signed)
[facility]  PATIENT: Amy Rivas DOB: August 08, 1990  REFERRING DOCTOR OR PCP:  Eloise Levels (PCP); Metta Clines (Neurology) SOURCE: Patient, notes from 2017 emergency room/admission and notes from Dr. Tomi Likens, laboratory and imaging results, MRI images on PACS  _________________________________   HISTORICAL  CHIEF COMPLAINT:  Chief Complaint  Patient presents with  . Multiple Sclerosis    Amy Rivas is here to transfer care of MS to Dr. Felecia Shelling.  Sts. Amy Rivas was dx. in May 2017.  MRI and LP done. Sts. presening sx. was right sided weakness.  Amy Rivas has been followed by Dr. Tomi Likens since dx. Sts. Amy Rivas initially started [place] but had an allergic rxn (hives, blisters).  After that, Amy Rivas was afraid to try another dmt. MRI was done at [facility].  Amy Rivas c/o nonrdiating upper back pain (just above bra line) onset Dec. 2017.  No known injury.  Has seen [facility] for same, and was rx'd   . Back Pain    Gabapentin, but didn't start it due to fear of allergic rxn/fim    HISTORY OF PRESENT ILLNESS:  I had the pleasure seeing you patient, Amy Rivas, at the [place] center at [facility] for neurologic consultation regarding her recent diagnosis of multiple sclerosis.  On 05/25/2015, Amy Rivas developed right-sided weakness and left facial droop. MRI of the brain with and without contrast showed multiple nonenhancing white matter lesions predominantly in the periventricular white matter. Of note, they were not present on an MRI from 01/24/2014. MRI of the cervical spine did not show any abnormality.   A lumbar puncture was performed and Amy Rivas had 3 oligoclonal bands.  Amy Rivas had a five-day course of IV Solu-Medrol. ESR and CRP were negative/normal.     In retrospect, a couple months before this exacerbation and hospital admission, Amy Rivas had right visual blurring x 2 weeks. Amy Rivas saw ophthalmology and had a normal examination.   In June 2017, Amy Rivas started [place].   On her  first dose, Amy Rivas had a blistering rash and stopped.    Dr. Tomi Likens discuss Gilenya with her but due to her sinus tachycardia and need to be on metoprolol, Gilenya is contraindicated.    Tysabri was discussed with her and initially Amy Rivas was going to start the medication. However, Amy Rivas is very concerned about the possibility of PML even though Amy Rivas is JCV antibody negative. Therefore, Amy Rivas did not want to start. Amy Rivas has not been on any disease modifying therapy for the past year. Amy Rivas has not had any exacerbations to her knowledge.  Gait/strength/sensation: Amy Rivas denies any difficulty with her gait or balance. Amy Rivas does not have any weakness and denies any numbness.  Bladder/bowel: Amy Rivas denies any difficulty with urinary urgency, frequency, hesitancy or incontinence. There are no bowel issues.  Vision: Her visual blurring that occurred in early 2017 completely resolved after a couple weeks. Amy Rivas does not note any difficulty with color vision. There is no diplopia. There is no eye pain.  Fatigue/sleep: Amy Rivas notes having some physical fatigue off-and-on, especially when Amy Rivas is hot. This is generally mild and does not affect her ability to do necessary activities. Amy Rivas generally sleeps well.  Mood/cognition: Amy Rivas denies any significant problems with depression and or anxiety in general. However, Amy Rivas has had some anxiety in relation to her MS and her treatment options. Amy Rivas denies any difficulty with cognitive issues.  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     These started as a  teenager and are associated with hr menstrual cycle.    Amy Rivas had an especially bad one January 2016 and was admitted to the hospital.  An MRI of the brain was normal at that time.    Mid back pain: Amy Rivas also has midline back pain in the mid to lower thoracic region.  Pain is worse with bending.   Ibuprofen 400 mg helps slightly.,   Hot compresses also help some.   Pain stays axial with no radiation  into legs.  Amy Rivas has seen [facility] orthopedics about this.   PT did not help her back any.     MRI of the thoracic spine did not show any significant degenerative changes. 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  Other pertinent medical issues: Amy Rivas has sinus tachycardia and is on metoprolol. Dr. Tomi Likens had discussed [place] with her cardiologist but due to the possible effect on heart rate and possibility of causing a heart block in combination with some cardiac medication, Gilenya is contraindicated.    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 Her last 2 LFTs over the past year have been normal. However, 2 years ago the ALT was elevated to more than 300.  I personally reviewed multiple MRIs including an MRI of the brain from 01/24/2014, MRI of the brain from 05/25/2015, MRIs of the cervical spine 05/26/2015 and 04/17/2016 an MRI of the thoracic spine 06/16/2015. The earlier MRI of the brain was essentially normal. However, 16 months later the 2017 MRI showed several periventricular foci, [place] which were hyperintense on diffusion-weighted images. None of the foci enhanced after contrast. Amy Rivas does not have any plaque in the cervical or thoracic spinal cord.  REVIEW OF SYSTEMS: Constitutional: No fevers, chills, sweats, or change in appetite.  Mild fatigue Eyes: No visual changes, double vision, eye pain Ear, nose and throat: No hearing loss, ear pain, nasal congestion, sore throat Cardiovascular: No chest pain, palpitations.   History of sinus tachycardia Respiratory: No shortness of breath at rest or with exertion.   No wheezes GastrointestinaI: No nausea, vomiting, diarrhea, abdominal pain, fecal incontinence.   Amy Rivas has had several episodes of elevated liver function enzymes Genitourinary: No dysuria, urinary retention or frequency.  No nocturia. Musculoskeletal: Amy Rivas has  thoracic back pain Integumentary: No rash, pruritus, skin lesions Neurological: as above Psychiatric: No depression at this time.  Mild anxiety Endocrine: No palpitations, diaphoresis, change in appetite, change in weigh or increased thirst Hematologic/Lymphatic: No anemia, purpura, petechiae. Allergic/Immunologic: No itchy/runny eyes, nasal congestion, recent allergic reactions, rashes  ALLERGIES: Allergies  Allergen Reactions  . Albuterol Anaphylaxis  . Hydrocodone Anaphylaxis and Nausea And Vomiting  . Codeine Nausea And Vomiting  . Lortab [Hydrocodone-Acetaminophen] Nausea And Vomiting    Patient can tolerate acetaminophen solely  . Morphine And Related Swelling and Rash  . Tecfidera [Dimethyl Fumarate] Hives and Swelling    HOME MEDICATIONS:  Current Outpatient Prescriptions:  .  ibuprofen (ADVIL,MOTRIN) 200 MG tablet, Take 200 mg by mouth every 6 (six) hours as needed for moderate pain. , Disp: , Rfl:  .  metoprolol tartrate (LOPRESSOR) 25 MG tablet, Take 25 mg by mouth 2 (two) times daily., Disp: , Rfl:  .  acetaminophen (TYLENOL) 500 MG tablet, Take 1,000 mg by mouth every 6 (six) hours as needed for fever., Disp: , Rfl:  .  predniSONE (STERAPRED UNI-PAK 21 TAB) 10 MG (21) TBPK tablet,  Take 6 tabs by mouth daily  for 2 days, then 5 tabs for 2 days, then 4 tabs for 2 days, then 3 tabs for 2 days, 2 tabs for 2 days, then 1 tab by mouth daily for 2 days (Patient not taking: Reported on 07/05/2016), Disp: 42 tablet, Rfl: 0 .  Vitamin D, Ergocalciferol, (DRISDOL) 50000 units CAPS capsule, Take 1 capsule (50,000 Units total) by mouth every 7 (seven) days., Disp: 12 capsule, Rfl: 0  Current Facility-Administered Medications:  .  0.9 %  sodium chloride infusion, 500 mL, Intravenous, Continuous, Armbruster, Renelda Loma, MD  PAST MEDICAL HISTORY: Past Medical History:  Diagnosis Date  . Anxiety   . Elevated LFTs   . GERD (gastroesophageal reflux disease)   . Headache   .  Hemangioma    as a child; had liver biopsy but unsure of results  . MS (multiple sclerosis) ([place]) 05/2015  . Neuromuscular disorder ([place])    MS  . Ovarian cyst   . PSVT (paroxysmal supraventricular tachycardia) ([place])   . Vitamin D deficiency     PAST SURGICAL HISTORY: Past Surgical History:  Procedure Laterality Date  . CESAREAN SECTION  2010, 2012  . LAPAROSCOPIC CHOLECYSTECTOMY  2008  . TONSILLECTOMY AND ADENOIDECTOMY  1992  . TYMPANOSTOMY [place], 2003    FAMILY HISTORY: Family History  Problem Relation Age of Onset  . Heart disease Mother   . Diabetes Father   . Lung cancer Maternal Grandmother   . Diabetes Maternal Grandmother   . Asthma Maternal Grandmother   . Breast cancer Maternal Grandmother   . Cancer Maternal Uncle        type unknown  . Diabetes Maternal Grandfather   . Diabetes Paternal Grandmother   . Diabetes Paternal Grandfather   . Healthy Sister     SOCIAL HISTORY:  Social History   Social History  . Marital status: Married    Spouse name: N/A  . Number of children: 2  . Years of education: N/A   Occupational History  . stay at home mom    Social History Main Topics  . Smoking status: Never Smoker  . Smokeless tobacco: Never Used  . Alcohol use No  . Drug use: No  . Sexual activity: Yes    Partners: Male    Birth control/ protection: Other-see comments   Other Topics Concern  . Not on file   Social History Narrative   ** Merged History Encounter **         PHYSICAL EXAM  Vitals:   07/05/16 1322  BP: 123/87  Pulse: 83  Resp: 16  Weight: 108 lb 8 oz (49.2 kg)  Height: '4\' 8"'$  (1.422 m)    Body mass index is 24.33 kg/m.   General: The patient is well-developed and well-nourished and in no acute distress  Eyes:  Funduscopic exam shows normal optic discs and retinal vessels.  Neck: The neck is supple, no carotid bruits are noted.  The neck is nontender.  Cardiovascular: The heart has a regular rate  and rhythm with a normal S1 and S2. There were no murmurs, gallops or rubs. Lungs are clear to auscultation.  Skin: Extremities are without significant edema.  Musculoskeletal:  Back is nontender  Neurologic Exam  Mental status: The patient is alert and oriented x 3 at the time of the examination. The patient has apparent normal recent and remote memory, with an apparently normal attention span and concentration ability.   Speech is  normal.  Cranial nerves: Extraocular movements are full. Pupils are equal, round, and reactive to light and accomodation.  Visual fields are full.  Facial symmetry is present. There is good facial sensation to soft touch bilaterally.Facial strength is normal.  Trapezius and sternocleidomastoid strength is normal. No dysarthria is noted.  The tongue is midline, and the patient has symmetric elevation of the soft palate. No obvious hearing deficits are noted.  Motor:  Muscle bulk is normal.   Tone is normal. Strength is  5 / 5 in all 4 extremities.   Sensory: Sensory testing is intact to pinprick, soft touch and vibration sensation in all 4 extremities.  Coordination: Cerebellar testing reveals good finger-nose-finger and heel-to-shin bilaterally.  Gait and station: Station is normal.   Gait is normal. Tandem gait is normal. Romberg is negative.   Reflexes: Deep tendon reflexes are symmetric and normal bilaterally.   Plantar responses are flexor.    DIAGNOSTIC DATA (LABS, IMAGING, TESTING) - I reviewed patient records, labs, notes, testing and imaging myself where available.  Lab Results  Component Value Date   WBC 9.7 04/01/2016   HGB 14.3 04/01/2016   HCT 39.7 04/01/2016   MCV 86.9 04/01/2016   PLT 255 04/01/2016      Component Value Date/Time   NA 138 04/01/2016 0302   K 3.5 04/01/2016 0302   CL 111 04/01/2016 0302   CO2 17 (L) 04/01/2016 0302   GLUCOSE 127 (H) 04/01/2016 0302   BUN 8 04/01/2016 0302   CREATININE 0.64 04/01/2016 0302   CALCIUM  9.5 04/01/2016 0302   PROT 7.0 01/19/2016 1401   ALBUMIN 4.6 01/19/2016 1401   AST 16 01/19/2016 1401   ALT 14 01/19/2016 1401   ALKPHOS 40 01/19/2016 1401   BILITOT 0.4 01/19/2016 1401   GFRNONAA >60 04/01/2016 0302   GFRAA >60 04/01/2016 0302   Lab Results  Component Value Date   CHOL 179 05/26/2015   HDL 57 05/26/2015   LDLCALC 103 (H) 05/26/2015   TRIG 93 05/26/2015   CHOLHDL 3.1 05/26/2015   Lab Results  Component Value Date   HGBA1C 4.8 05/26/2015   No results found for: VITAMINB12 Lab Results  Component Value Date   TSH 1.180 04/14/2013       ASSESSMENT AND PLAN   Multiple sclerosis ([place]) - Plan: CBC with Differential/Platelet, Hepatitis B core antibody, total, Hepatitis B surface antigen, Hepatitis B surface antibody, Hepatic function panel, Quantiferon tb gold assay (blood)  Sinus tachycardia (HCC)  Hemiplegic migraine without status migrainosus, not intractable  Elevated LFTs  High risk medication use - Plan: CBC with Differential/Platelet, Hepatitis B core antibody, total, Hepatitis B surface antigen, Hepatitis B surface antibody, Hepatic function panel, Quantiferon tb gold assay (blood)   In summary, Amy Rivas is a 26 year old woman with a recent diagnosis of MS. Her initial exacerbation was May 2017 when Amy Rivas had right-sided weakness. MRI shows dissemination in space as well as time as an MRI from 2016 was normal. We spent a lot of time discussing possible disease modifying therapies for her MS.   Amy Rivas had an allergic reaction to Tecfidera. 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 Due to her cardiac issues, Amy Rivas is not a candidate for Gilenya. We discussed Copaxone, Tysabri and ocrelizumab in more detail. Amy Rivas has some concern about the risk of PML even though Amy Rivas is JCV antibody negative and we will decide  between Copaxone and ocrelizumab. In detail, we went over  the risks and benefits of each and we gave her brochures. We will check blood work to make sure Amy Rivas does not have latent tuberculosis or hepatitis B.   Amy Rivas will get back to [place] within a week. We also discussed differences between fluctuations and relapses. If Amy Rivas believes Amy Rivas is having a relapse Amy Rivas should contact us and evaluate her and determine if Amy Rivas would need to have a course of IV steroids.  Amy Rivas will return in 3 months or sooner if Amy Rivas has new or worsening neurologic symptoms.  Thank you for resting me to see Amy Rivas for a neurologic consultation at the [place] center at [facility]. Please let me know if I can be of further assistance with her or other patients in the future.   Richard A. Felecia Shelling, MD, [facility]   02/27/9415, 4:08 PM Certified in Neurology, Clinical Neurophysiology, Sleep Medicine, Pain Medicine and Neuroimaging Dir., [facility]  [facility] [address] [place], [place] [address] [phone]

## 2016-07-06 LAB — HEPATITIS B SURFACE ANTIBODY,QUALITATIVE: HEP B SURFACE AB, QUAL: REACTIVE

## 2016-07-06 LAB — CBC WITH DIFFERENTIAL/PLATELET
Basophils Absolute: 0 10*3/uL (ref 0.0–0.2)
Basos: 0 %
EOS (ABSOLUTE): 0.1 10*3/uL (ref 0.0–0.4)
Eos: 1 %
Hematocrit: 43.7 % (ref 34.0–46.6)
Hemoglobin: 15 g/dL (ref 11.1–15.9)
Immature Grans (Abs): 0.1 10*3/uL (ref 0.0–0.1)
Immature Granulocytes: 1 %
LYMPHS ABS: 1.5 10*3/uL (ref 0.7–3.1)
Lymphs: 16 %
MCH: 31.3 pg (ref 26.6–33.0)
MCHC: 34.3 g/dL (ref 31.5–35.7)
MCV: 91 fL (ref 79–97)
MONOCYTES: 8 %
MONOS ABS: 0.8 10*3/uL (ref 0.1–0.9)
Neutrophils Absolute: 6.7 10*3/uL (ref 1.4–7.0)
Neutrophils: 74 %
PLATELETS: 253 10*3/uL (ref 150–379)
RBC: 4.79 x10E6/uL (ref 3.77–5.28)
RDW: 12.3 % (ref 12.3–15.4)
WBC: 9.1 10*3/uL (ref 3.4–10.8)

## 2016-07-06 LAB — HEPATIC FUNCTION PANEL
ALT: 11 IU/L (ref 0–32)
AST: 14 IU/L (ref 0–40)
Albumin: 4.8 g/dL (ref 3.5–5.5)
Alkaline Phosphatase: 46 IU/L (ref 39–117)
BILIRUBIN, DIRECT: 0.12 mg/dL (ref 0.00–0.40)
Bilirubin Total: 0.4 mg/dL (ref 0.0–1.2)
TOTAL PROTEIN: 6.9 g/dL (ref 6.0–8.5)

## 2016-07-06 LAB — HEPATITIS B CORE ANTIBODY, TOTAL: Hep B Core Total Ab: NEGATIVE

## 2016-07-06 LAB — HEPATITIS B SURFACE ANTIGEN: HEP B S AG: NEGATIVE

## 2016-07-13 LAB — QUANTIFERON IN TUBE
QUANTIFERON MITOGEN VALUE: 0.1 IU/mL
QUANTIFERON TB AG VALUE: 0.06 [IU]/mL
QUANTIFERON TB GOLD: UNDETERMINED — AB
Quantiferon Nil Value: 0.1 IU/mL

## 2016-07-13 LAB — QUANTIFERON TB GOLD ASSAY (BLOOD)

## 2016-07-17 ENCOUNTER — Other Ambulatory Visit: Payer: Self-pay | Admitting: Neurology

## 2016-07-17 ENCOUNTER — Telehealth: Payer: Self-pay | Admitting: *Deleted

## 2016-07-17 DIAGNOSIS — R7612 Nonspecific reaction to cell mediated immunity measurement of gamma interferon antigen response without active tuberculosis: Secondary | ICD-10-CM

## 2016-07-17 NOTE — Telephone Encounter (Signed)
LMTC./fim 

## 2016-07-17 NOTE — Telephone Encounter (Signed)
-----   Message from Britt Bottom, MD sent at 07/17/2016  4:06 PM EDT ----- Please let her knw that the quantiferon TB test was indeterminate. Therefore, just to be safe we need to check a chest x-ray. I put the order in the chart.

## 2016-07-20 ENCOUNTER — Telehealth: Payer: Self-pay | Admitting: *Deleted

## 2016-07-20 DIAGNOSIS — G35 Multiple sclerosis: Secondary | ICD-10-CM

## 2016-07-20 NOTE — Telephone Encounter (Signed)
I have spoken with Amy Rivas this morning and per RAS, reviewed lab results and need for a CXR due to indeterminate TB Gold test.  She verbalized understanding of same, is agreeable, and will go to [place] for this.  She requested MRI, since last MRI was over a yr. ago.  Although she is not having sig. new sx., she would like MRI for her personal knowledge, and to help guide upcoming decisions on dmt.  RAS is agreeable.  MRI ordered/fim

## 2016-07-20 NOTE — Telephone Encounter (Signed)
Noted, thank you I left the patient a voicemail for her to call me back about scheduling her MRI.

## 2016-07-20 NOTE — Telephone Encounter (Signed)
-----   Message from Britt Bottom, MD sent at 07/17/2016  4:06 PM EDT ----- Please let her knw that the quantiferon TB test was indeterminate. Therefore, just to be safe we need to check a chest x-ray. I put the order in the chart.

## 2016-07-21 ENCOUNTER — Telehealth: Payer: Self-pay | Admitting: *Deleted

## 2016-07-21 NOTE — Telephone Encounter (Signed)
I have spoken with Amy Rivas this morning.  1--she does not need to have the CXR done that was ordered last week.  Per RAS, she had a CXR in March was ok--so no need to repeat.  2--Her lab work is ok for either Copaxone or Ocrevus.  She verbalized understanding of same--would like to start [place].  She will come in next week to sign srf./fim

## 2016-07-24 NOTE — Telephone Encounter (Signed)
Patient is scheduled to have her MRI at our [place] [facility] mobile unit for Wednesday 07/26/16.

## 2016-07-26 ENCOUNTER — Ambulatory Visit (INDEPENDENT_AMBULATORY_CARE_PROVIDER_SITE_OTHER): Payer: BLUE CROSS/BLUE SHIELD

## 2016-07-26 DIAGNOSIS — G35 Multiple sclerosis: Secondary | ICD-10-CM

## 2016-07-26 MED ORDER — GADOPENTETATE DIMEGLUMINE 469.01 MG/ML IV SOLN
10.0000 mL | Freq: Once | INTRAVENOUS | Status: AC | PRN
Start: 2016-07-26 — End: ?

## 2016-07-27 ENCOUNTER — Telehealth: Payer: Self-pay | Admitting: *Deleted

## 2016-07-27 NOTE — Telephone Encounter (Signed)
-----   Message from Britt Bottom, MD sent at 07/26/2016  6:45 PM EDT ----- Please let her know that the MRI of the brain did not show any new lesions.

## 2016-07-27 NOTE — Telephone Encounter (Signed)
I have spoken with Amy Rivas this morning and per RAS, explained that MRI brain shows no new MS changes.  She verbalized understanding of same/fim

## 2016-08-10 ENCOUNTER — Telehealth: Payer: Self-pay | Admitting: Neurology

## 2016-08-10 ENCOUNTER — Telehealth: Payer: Self-pay | Admitting: Adult Health

## 2016-08-10 ENCOUNTER — Encounter: Payer: Self-pay | Admitting: Adult Health

## 2016-08-10 ENCOUNTER — Ambulatory Visit (INDEPENDENT_AMBULATORY_CARE_PROVIDER_SITE_OTHER): Payer: BLUE CROSS/BLUE SHIELD | Admitting: Adult Health

## 2016-08-10 VITALS — BP 127/83 | HR 96 | Resp 16 | Ht <= 58 in | Wt 108.0 lb

## 2016-08-10 DIAGNOSIS — R202 Paresthesia of skin: Secondary | ICD-10-CM | POA: Diagnosis not present

## 2016-08-10 DIAGNOSIS — H539 Unspecified visual disturbance: Secondary | ICD-10-CM | POA: Diagnosis not present

## 2016-08-10 DIAGNOSIS — G35 Multiple sclerosis: Secondary | ICD-10-CM | POA: Diagnosis not present

## 2016-08-10 DIAGNOSIS — R200 Anesthesia of skin: Secondary | ICD-10-CM | POA: Diagnosis not present

## 2016-08-10 DIAGNOSIS — R269 Unspecified abnormalities of gait and mobility: Secondary | ICD-10-CM

## 2016-08-10 NOTE — Progress Notes (Signed)
I have read the note, and I agree with the clinical assessment and plan.  WILLIS,CHARLES KEITH   

## 2016-08-10 NOTE — Patient Instructions (Addendum)
Your Plan:  MRI cervical spine  IV solu-medrol x 3 days If your symptoms worsen or you develop new symptoms please let us know.     Thank you for coming to see [place] at [facility]. I hope we have been able to provide you high quality care today.  You may receive a patient satisfaction survey over the next few weeks. We would appreciate your feedback and comments so that we may continue to improve ourselves and the health of our patients.

## 2016-08-10 NOTE — Telephone Encounter (Signed)
I have spoken with Amy Rivas this morning.  She c/o numbness/tingling arms and legs, blurry vision.  RAS is ooo.  W/I appt. given with MM this am 0930/fim

## 2016-08-10 NOTE — Progress Notes (Signed)
PATIENT: Amy Rivas DOB: [birth year]  REASON FOR VISIT: follow up- multiple sclerosis HISTORY FROM: patient  HISTORY OF PRESENT ILLNESS: Amy Rivas is a 26 year old female with a history of multiple sclerosis. She returns today to discuss flareup of symptoms. She recently saw Dr. Epimenio Foot in June. She states that she woke up on 7/25 and had numbness and tingling in the legs and arms. She states this has occurred off and on since then. She states that she also has developed weakness in the right leg. She states when she is ambulating she is dragging her right toe. She reports that the symptoms were present on her first flareup but improved. She states typically she does not have any trouble in ambulating. She denies any trouble with the bowels or bladder. She reports that she has been experiencing blurry vision. She recently had an MRI of the brain that did not show any active lesions. She returns today for an evaluation.  HISTORY 06/2016: Copied from Dr. Bonnita Hollow notes: On 05/25/2015, she developed right-sided weakness and left facial droop. MRI of the brain with and without contrast showed multiple nonenhancing white matter lesions predominantly in the periventricular white matter. Of note, they were not present on an MRI from 01/24/2014. MRI of the cervical spine did not show any abnormality.   A lumbar puncture was performed and she had 3 oligoclonal bands.  She had a five-day course of IV Solu-Medrol. ESR and CRP were negative/normal.     In retrospect, a couple months before this exacerbation and hospital admission, she had right visual blurring x 2 weeks. She saw ophthalmology and had a normal examination.   In June 2017, she started [place].   On her first dose, she had a blistering rash and stopped.    Dr. Everlena Cooper discuss Gilenya with her but due to her sinus tachycardia and need to be on metoprolol, Gilenya is contraindicated.    Tysabri was discussed with her and initially she was going to  start the medication. However, she is very concerned about the possibility of PML even though she is JCV antibody negative. Therefore, she did not want to start. She has not been on any disease modifying therapy for the past year. She has not had any exacerbations to her knowledge.  Gait/strength/sensation: She denies any difficulty with her gait or balance. She does not have any weakness and denies any numbness.  Bladder/bowel: She denies any difficulty with urinary urgency, frequency, hesitancy or incontinence. There are no bowel issues.  Vision: Her visual blurring that occurred in early 2017 completely resolved after a couple weeks. She does not note any difficulty with color vision. There is no diplopia. There is no eye pain.  Fatigue/sleep: She notes having some physical fatigue off-and-on, especially when she is hot. This is generally mild and does not affect her ability to do necessary activities. She generally sleeps well.  Mood/cognition: She denies any significant problems with depression and or anxiety in general. However, she has had some anxiety in relation to her MS and her treatment options. She denies any difficulty with cognitive issues.  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     These started as a teenager and are associated with hr menstrual cycle.    She had an especially bad one January 2016 and was admitted to the hospital.  An MRI of the brain was normal at that time.    Mid back pain: She also  has midline back pain in the mid to lower thoracic region.  Pain is worse with bending.   Ibuprofen 400 mg helps slightly.,   Hot compresses also help some.   Pain stays axial with no radiation into legs.  She has seen [facility] about this.   PT did not help her back any.     MRI of the thoracic spine did not show any significant degenerative changes. 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  Other pertinent medical issues: She has sinus tachycardia and is on metoprolol. Dr. Tomi Likens had discussed Bart[place] with her cardiologist but due to the possible effect on heart rate and possibility of causing a heart block in combination with some cardiac medication, Gilenya is contraindicated.    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 Her last 2 LFTs over the past year have been normal. However, 2 years ago the ALT was elevated to more than 300.  I personally reviewed multiple MRIs including an MRI of the brain from 01/24/2014, MRI of the brain from 05/25/2015, MRIs of the cervical spine 05/26/2015 and 04/17/2016 an MRI of the thoracic spine 06/16/2015. The earlier MRI of the brain was essentially normal. However, 16 months later the 2017 MRI showed several periventricular foci, S[place] which were hyperintense on diffusion-weighted images. None of the foci enhanced after contrast. She does not have any plaque in the cervical or thoracic spinal cord.  REVIEW OF SYSTEMS: Out of a complete 14 system review of symptoms, the patient complains only of the following symptoms, and all other reviewed systems are negative.  See HPI  ALLERGIES: Allergies  Allergen Reactions  . Albuterol Anaphylaxis  . Hydrocodone Anaphylaxis and Nausea And Vomiting  . Codeine Nausea And Vomiting  . Lortab [Hydrocodone-Acetaminophen] Nausea And Vomiting    Patient can tolerate acetaminophen solely  . Morphine And Related Swelling and Rash  . Tecfidera [Dimethyl Fumarate] Hives and Swelling    HOME MEDICATIONS: Outpatient Medications Prior to Visit  Medication Sig Dispense Refill  . acetaminophen (TYLENOL) 500 MG tablet Take 1,000 mg by mouth every 6 (six) hours as needed for fever.    Marland Kitchen ibuprofen (ADVIL,MOTRIN) 200 MG tablet Take 200 mg by mouth every 6 (six) hours as needed for moderate pain.     . metoprolol tartrate  (LOPRESSOR) 25 MG tablet Take 25 mg by mouth 2 (two) times daily.    . Vitamin D, Ergocalciferol, (DRISDOL) 50000 units CAPS capsule Take 1 capsule (50,000 Units total) by mouth every 7 (seven) days. 12 capsule 0  . predniSONE (STERAPRED UNI-PAK 21 TAB) 10 MG (21) TBPK tablet Take 6 tabs by mouth daily  for 2 days, then 5 tabs for 2 days, then 4 tabs for 2 days, then 3 tabs for 2 days, 2 tabs for 2 days, then 1 tab by mouth daily for 2 days (Patient not taking: Reported on 07/05/2016) 42 tablet 0   Facility-Administered Medications Prior to Visit  Medication Dose Route Frequency Provider Last Rate Last Dose  . 0.9 %  sodium chloride infusion  500 mL Intravenous Continuous Armbruster, Renelda Loma, MD      . gadopentetate dimeglumine (MAGNEVIST) injection 10 mL  10 mL Intravenous Once PRN Sater, Nanine Means, MD        PAST MEDICAL HISTORY: Past Medical History:  Diagnosis Date  . Anxiety   . Elevated LFTs   .  GERD (gastroesophageal reflux disease)   . Headache   . Hemangioma    as a child; had liver biopsy but unsure of results  . MS (multiple sclerosis) (HCC) 05/2015  . Neuromuscular disorder (HCC)    MS  . Ovarian cyst   . PSVT (paroxysmal supraventricular tachycardia) (HCC)   . Vitamin D deficiency     PAST SURGICAL HISTORY: Past Surgical History:  Procedure Laterality Date  . CESAREAN SECTION  2010, 2012  . LAPAROSCOPIC CHOLECYSTECTOMY  2008  . TONSILLECTOMY AND ADENOIDECTOMY  1992  . TYMPANOSTOMY TUBE PLACEMENT  1991, 1993, 2003    FAMILY HISTORY: Family History  Problem Relation Age of Onset  . Heart disease Mother   . Diabetes Father   . Lung cancer Maternal Grandmother   . Diabetes Maternal Grandmother   . Asthma Maternal Grandmother   . Breast cancer Maternal Grandmother   . Cancer Maternal Uncle        type unknown  . Diabetes Maternal Grandfather   . Diabetes Paternal Grandmother   . Diabetes Paternal Grandfather   . Healthy Sister     SOCIAL HISTORY: Social  History   Social History  . Marital status: Married    Spouse name: N/A  . Number of children: 2  . Years of education: N/A   Occupational History  . stay at home mom    Social History Main Topics  . Smoking status: Never Smoker  . Smokeless tobacco: Never Used  . Alcohol use No  . Drug use: No  . Sexual activity: Yes    Partners: Male    Birth control/ protection: Other-see comments   Other Topics Concern  . Not on file   Social History Narrative   ** Merged History Encounter **          PHYSICAL EXAM  Vitals:   08/10/16 0859  BP: 127/83  Pulse: 96  Resp: 16  Weight: 108 lb (49 kg)  Height: 4\' 8"  (1.422 m)   Body mass index is 24.21 kg/m.  Generalized: Well developed, in no acute distress   Neurological examination  Mentation: Alert oriented to time, place, history taking. Follows all commands speech and language fluent Cranial nerve II-XII: Pupils were equal round reactive to light. Extraocular movements were full, visual field were full on confrontational test. Facial sensation and strength were normal. Uvula tongue midline. Head turning and shoulder shrug  were normal and symmetric. Motor: The motor testing reveals 5 over 5 strength in LUE, LLE and RUE. 3-4/5 strength in the RLE. Right ankle weakness.  Sensory: Sensory testing is intact to soft touch on all 4 extremities. No evidence of extinction is noted.  Coordination: Cerebellar testing reveals good finger-nose-finger and heel-to-shin bilaterally.  Gait and station: Patient's gait is unsteady. She has a mild circumduction type gait on the right. She is dragging the right toe. Tandem gait not attempted. Romberg is negative Reflexes: Deep tendon reflexes are symmetric and normal bilaterally.   DIAGNOSTIC DATA (LABS, IMAGING, TESTING) - I reviewed patient records, labs, notes, testing and imaging myself where available.  Lab Results  Component Value Date   WBC 9.1 07/05/2016   HGB 15.0 07/05/2016    HCT 43.7 07/05/2016   MCV 91 07/05/2016   PLT 253 07/05/2016      Component Value Date/Time   NA 138 04/01/2016 0302   K 3.5 04/01/2016 0302   CL 111 04/01/2016 0302   CO2 17 (L) 04/01/2016 0302   GLUCOSE 127 (H) 04/01/2016  0302   BUN 8 04/01/2016 0302   CREATININE 0.64 04/01/2016 0302   CALCIUM 9.5 04/01/2016 0302   PROT 6.9 07/05/2016 1449   ALBUMIN 4.8 07/05/2016 1449   AST 14 07/05/2016 1449   ALT 11 07/05/2016 1449   ALKPHOS 46 07/05/2016 1449   BILITOT 0.4 07/05/2016 1449   GFRNONAA >60 04/01/2016 0302   GFRAA >60 04/01/2016 0302   Lab Results  Component Value Date   CHOL 179 05/26/2015   HDL 57 05/26/2015   LDLCALC 103 (H) 05/26/2015   TRIG 93 05/26/2015   CHOLHDL 3.1 05/26/2015   Lab Results  Component Value Date   HGBA1C 4.8 05/26/2015   No results found for: [id number] Lab Results  Component Value Date   TSH 1.180 04/14/2013      ASSESSMENT AND PLAN 26 y.o. year old female  has a past medical history of Anxiety; Elevated LFTs; GERD (gastroesophageal reflux disease); Headache; Hemangioma; MS (multiple sclerosis) ([place]) (05/2015); Neuromuscular disorder ([place]); Ovarian cyst; PSVT (paroxysmal supraventricular tachycardia) ([place]); and Vitamin D deficiency. here with :  1. Multiple sclerosis 2. Abnormality of gait 3. Numbness and tingling  It seems that the patient is having an exacerbatio. I will set the patient up for IV Solu-Medrol 1000 mg for the next 3 days. If her symptoms are not improving we can extend this for a total of 5 days. I will also order an MRI of the cervical spine to evaluate for any lesions. Patient is advised that if her symptoms worsen or she develops new symptoms she should let us know. She will follow-up in September with Dr. Felecia Shelling.     Ward Givens, MSN, NP-C 08/10/2016, 9:08 AM [facility] [address], [place],  [address] [phone]

## 2016-08-10 NOTE — Telephone Encounter (Signed)
Pt calling to inform that she feels she is having a MS flare up. Pt said that it started yesterday morning. Pt states she does not want to go to ER yet, would like to hear from [place] first, please call

## 2016-08-14 ENCOUNTER — Ambulatory Visit
Admission: RE | Admit: 2016-08-14 | Discharge: 2016-08-14 | Disposition: A | Discharge status: Home / Self Care | Payer: BLUE CROSS/BLUE SHIELD | Source: Ambulatory Visit | Attending: Neurology | Admitting: Neurology

## 2016-08-14 ENCOUNTER — Other Ambulatory Visit: Payer: Self-pay | Admitting: Neurology

## 2016-08-14 DIAGNOSIS — R0602 Shortness of breath: Secondary | ICD-10-CM

## 2016-08-14 NOTE — Telephone Encounter (Signed)
I have spoken with Amy Rivas this afternoon. She sts. that, after 3 days of IV SM, she is 85-90% improved.  Update passed along to RAS.  RAS saw her as well, when she was in the infusion suite for 3rd day of IV SM today/fim

## 2016-08-14 NOTE — Progress Notes (Signed)
Amy Rivas returns today for her third day of IV Solu-Medrol. She reports shortness of breath that has been present the last week. She notes it is worse when she is laying down.      On examination, the lungs had normal sounds.

## 2016-08-14 NOTE — Progress Notes (Signed)
I have read the note, and I agree with the clinical assessment and plan.  Estelle Greenleaf A. Kinga Cassar, MD, PhD, FAAN Certified in Neurology, Clinical Neurophysiology, Sleep Medicine, Pain Medicine and Neuroimaging  [facility] [address], [address] [place], [place] [address] [phone]  

## 2016-08-15 ENCOUNTER — Telehealth: Payer: Self-pay | Admitting: *Deleted

## 2016-08-15 NOTE — Telephone Encounter (Signed)
I have spoken with Amy Rivas this afternoon.  Since she recently had exacerbation requiring IV steroids, per RAS, the sooner she can get her first Ocrelizumab infusion the better.  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 She is willing to have first infusions if she can at least get in for part A next week.  She will check with the coordinator for her residency at [facility] to ensure she will be able to take another day in 2 wks. for part B infusion, and then call us back.  I will check with Tina/Mindy to see if pt. can get in for part A next week/fim

## 2016-08-15 NOTE — Telephone Encounter (Signed)
LMOM that per RAS, CXR is normal.  She does not need to return this call unless she has questions/fim

## 2016-08-15 NOTE — Telephone Encounter (Signed)
Pt returned Rn's call °

## 2016-08-15 NOTE — Telephone Encounter (Signed)
-----   Message from Britt Bottom, MD sent at 08/14/2016  6:04 PM EDT ----- Please let her know that the chest x-ray was normal.

## 2016-08-15 NOTE — Telephone Encounter (Signed)
[place]. She is approved for Ocrelizumab.  Need to talk to her about scheduling asap/fim

## 2016-08-16 ENCOUNTER — Telehealth: Payer: Self-pay | Admitting: Neurology

## 2016-08-16 ENCOUNTER — Ambulatory Visit (INDEPENDENT_AMBULATORY_CARE_PROVIDER_SITE_OTHER): Payer: Self-pay

## 2016-08-16 DIAGNOSIS — Z0289 Encounter for other administrative examinations: Secondary | ICD-10-CM

## 2016-08-16 DIAGNOSIS — R202 Paresthesia of skin: Secondary | ICD-10-CM

## 2016-08-16 DIAGNOSIS — G35 Multiple sclerosis: Secondary | ICD-10-CM

## 2016-08-16 DIAGNOSIS — R200 Anesthesia of skin: Secondary | ICD-10-CM

## 2016-08-16 DIAGNOSIS — R269 Unspecified abnormalities of gait and mobility: Secondary | ICD-10-CM

## 2016-08-16 DIAGNOSIS — H539 Unspecified visual disturbance: Secondary | ICD-10-CM

## 2016-08-16 MED ORDER — PROMETHAZINE HCL 12.5 MG PO TABS: 12.5000 mg | ORAL_TABLET | Freq: Four times a day (QID) | ORAL | 0 refills | Status: DC | PRN

## 2016-08-16 NOTE — Telephone Encounter (Signed)
Patient tried to complete her MRI today but could not lay still due to nausea from medications she is taking for her MS. We have rescheduled her MRI, is there anything that can be called in to help her with nausea? Please call and advise. The best number to reach her at is [phone].

## 2016-08-16 NOTE — Telephone Encounter (Signed)
I have spoken with Amy Rivas this afternoon.  She r/s her MRI.  Per RAS, ok for Phenergan 12.5mg   Rx. escribed to [facility] per pt's request/fim

## 2016-08-23 ENCOUNTER — Other Ambulatory Visit: Payer: BLUE CROSS/BLUE SHIELD

## 2016-08-24 ENCOUNTER — Telehealth: Payer: Self-pay | Admitting: Neurology

## 2016-08-24 NOTE — Telephone Encounter (Signed)
Rn call patient that per Dr. Felecia Shelling she can take motrin over the counter for pain. PT verbalized understanding.

## 2016-08-24 NOTE — Telephone Encounter (Signed)
Pt's husband called said she had 1st dose of ocrevus yesterday and is having leg pain today. He is wanting to know if the pt can take ibuprofen? Please call, he is aware the clinic closes at 5:00.

## 2016-08-31 ENCOUNTER — Telehealth: Payer: Self-pay | Admitting: Neurology

## 2016-08-31 NOTE — Telephone Encounter (Signed)
The patient has gone to her physician. She has symptoms of an upper respiratory tract infection, there is some question that she may have pneumonia as well.  The patient received a dose of Ocrevus 8 days ago. URI symptoms are a known complication of this therapy. The patient will be going on antibiotics, they will check a CBC and differential and conference of metabolic profile.  The patient will be instructed to go to the emergency room if her symptoms worsen over the next several days.  They will send blood work to our office.

## 2016-09-05 ENCOUNTER — Ambulatory Visit (INDEPENDENT_AMBULATORY_CARE_PROVIDER_SITE_OTHER): Payer: BLUE CROSS/BLUE SHIELD | Admitting: Neurology

## 2016-09-05 ENCOUNTER — Encounter: Payer: Self-pay | Admitting: Neurology

## 2016-09-05 VITALS — BP 131/79 | HR 83 | Temp 98.3°F | Resp 16 | Ht <= 58 in | Wt 110.0 lb

## 2016-09-05 DIAGNOSIS — G35 Multiple sclerosis: Secondary | ICD-10-CM

## 2016-09-05 DIAGNOSIS — J40 Bronchitis, not specified as acute or chronic: Secondary | ICD-10-CM | POA: Diagnosis not present

## 2016-09-05 DIAGNOSIS — Z79899 Other long term (current) drug therapy: Secondary | ICD-10-CM

## 2016-09-05 IMAGING — US US ABDOMEN COMPLETE
1 series · 13 of 25 positions shown · non-contrast
Comparison: 12/16/2013

CLINICAL DATA: Follow-up hepatic hemangioma

EXAM:
ABDOMEN ULTRASOUND COMPLETE

[Series 1: us abdomen complete · 0.20mm/px · 13 of 121 slices shown]
[im 1/121]
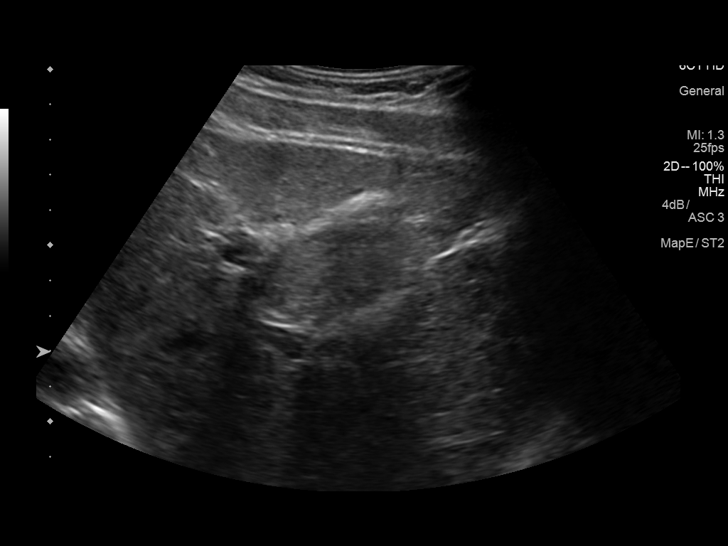
[im 11/121]
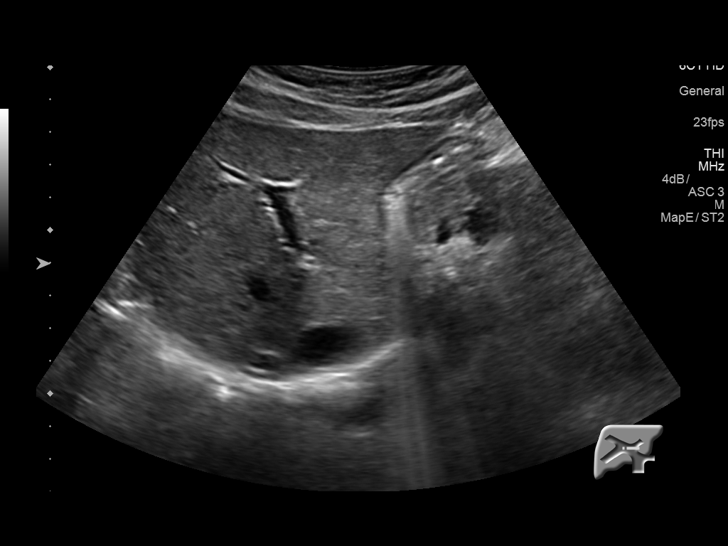
[im 21/121]
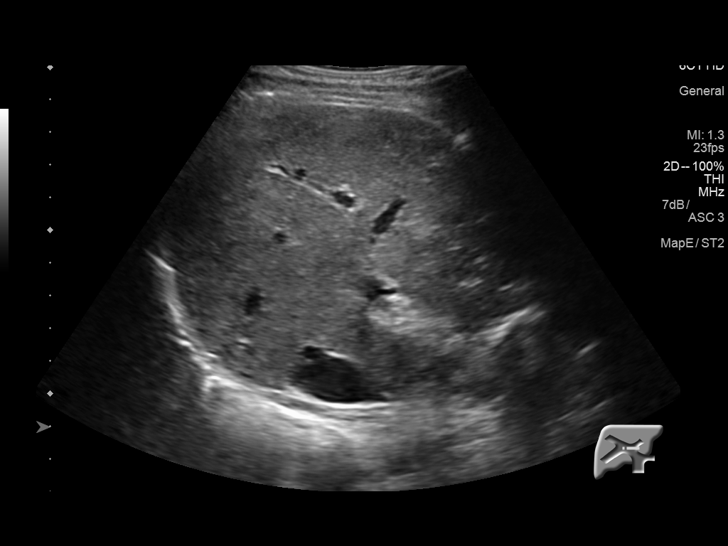
[im 31/121]
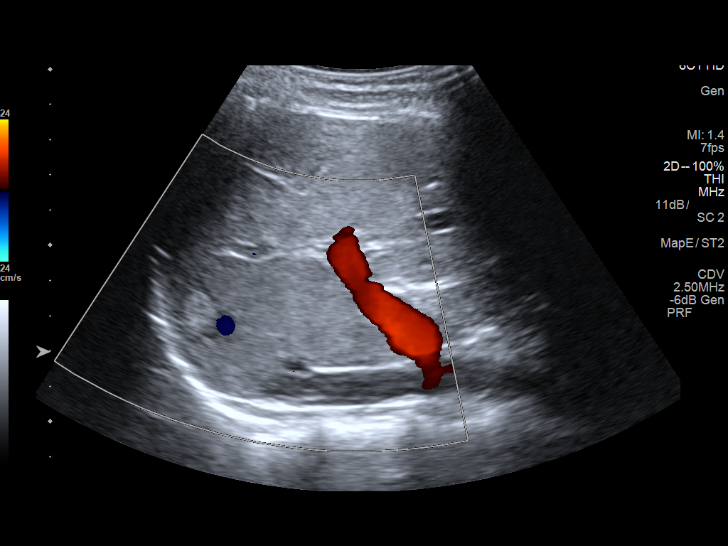
[im 41/121]
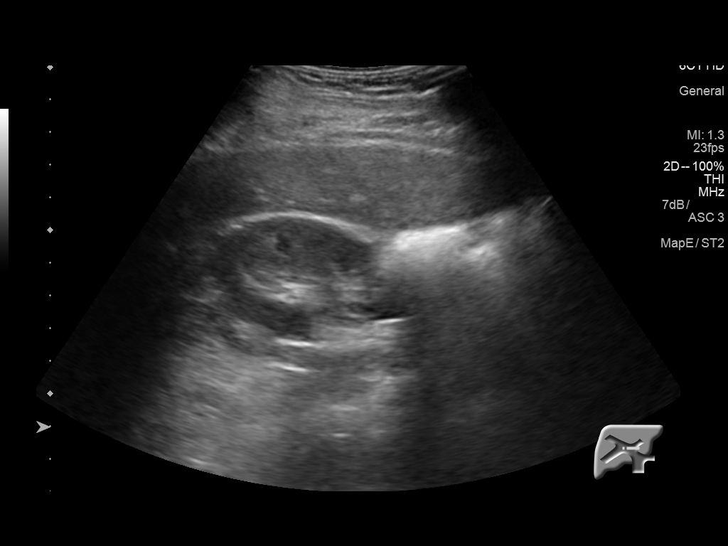
[im 51/121]
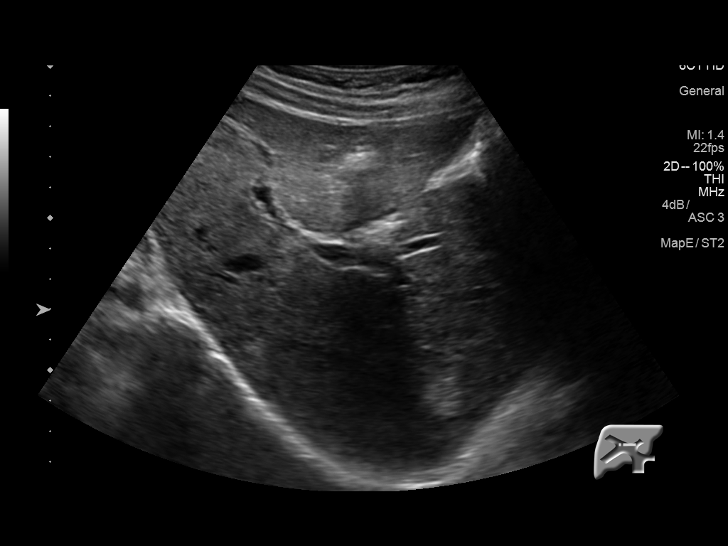
[im 61/121]
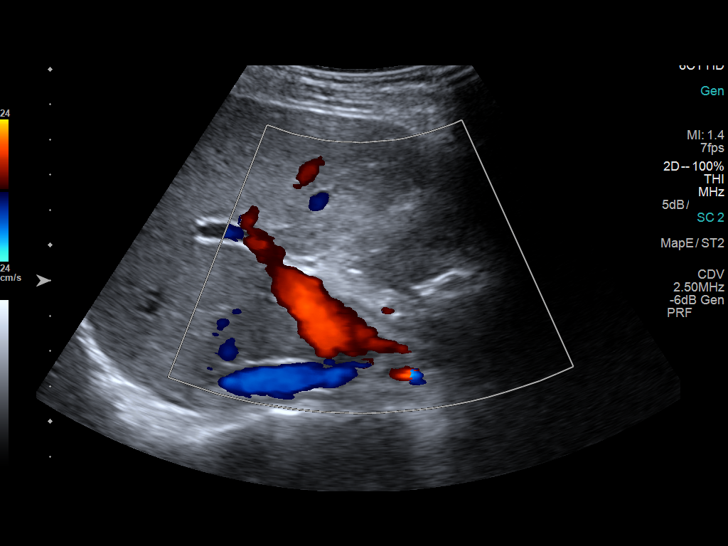
[im 71/121]
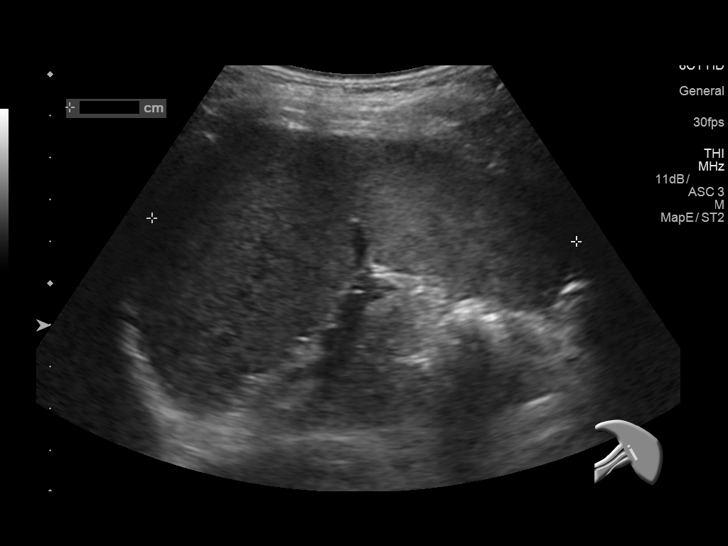
[im 81/121]
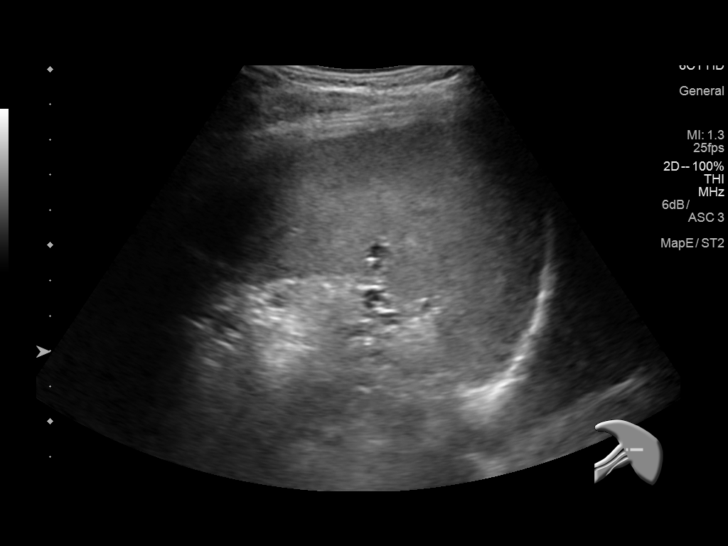
[im 91/121]
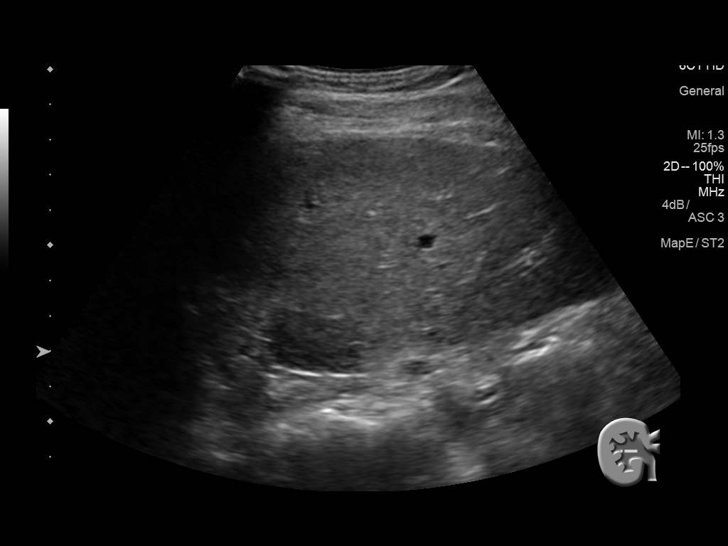
[im 101/121]
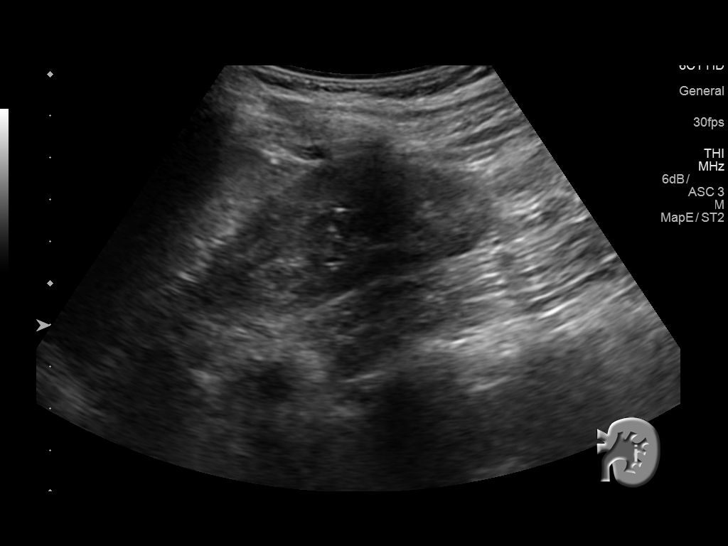
[im 111/121]
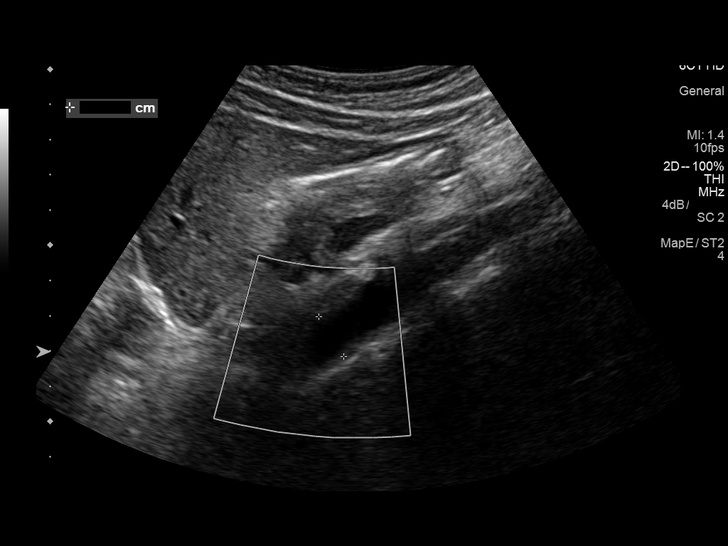
[im 121/121]
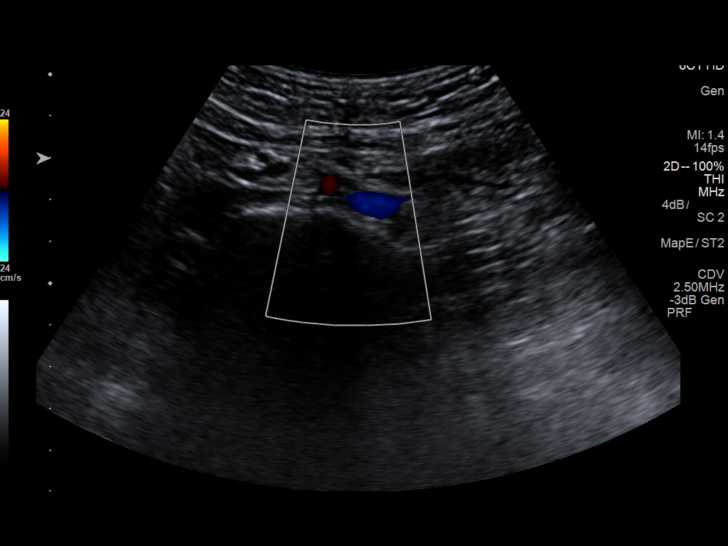

[13 of 25 positions shown; findings below may reference images not displayed]

FINDINGS: Gallbladder: Surgically absent

Common bile duct: Diameter: 2.4 mm in diameter within normal limits.

Liver: The liver shows normal echogenicity. Again noted right
hepatic lobe hyperechoic lesion probable hemangioma measures 1 x
cm stable from prior exam. Second hyperechoic lesion right hepatic
lobe measures 1.2 x 0.9 cm stable from prior exam. A cyst in left
hepatic lobe measures 6 x 5 mm. There is a third hemangioma in
anterior aspect of right hepatic lobe measures about 0.9 cm.

IVC: No abnormality visualized.

Pancreas: Visualized portion unremarkable.

Spleen: Size and appearance within normal limits. Measures 10.2 cm
in length

Right Kidney: Length: 9.3 cm. Echogenicity within normal limits. No
mass or hydronephrosis visualized.

Left Kidney: Length: 9.0 cm. Echogenicity within normal limits. No
mass or hydronephrosis visualized.

Abdominal aorta: No aneurysm visualized. Measures up to 1.5 cm in
diameter.

Other findings: None.
IMPRESSION: 1. Grossly stable hepatic hemangiomas. Small cyst is noted in left
hepatic lobe. Status postcholecystectomy.
2. No hydronephrosis.  No aortic aneurysm.

## 2016-09-05 NOTE — Progress Notes (Signed)
[facility]  PATIENT: Amy Rivas DOB: 1990/02/20  REFERRING DOCTOR OR PCP:  Eloise Levels (PCP); Metta Clines (Neurology) SOURCE: Patient, notes from 2017 emergency room/admission and notes from Dr. Tomi Likens, laboratory and imaging results, MRI images on PACS  _________________________________   HISTORICAL  CHIEF COMPLAINT:  Chief Complaint  Patient presents with  . Multiple Sclerosis    Had part A of 1st Ocrevus infusion 2 weeks ago.  Since then, has been sick with bronchitis, pneumonia.  Is currenly on Doxycycline (day 6) Part B due tomorrow. Here to discuss if she is able to proceed with infusion/fim    HISTORY OF PRESENT ILLNESS:  Amy Rivas Is a 26 year old woman with multiple sclerosis diagnosed in 17 after presenting right-sided weakness and left facial droop.  Update 09/05/2016:   She had ocrelizumab 13 days ago.   About 10-11 days ago, she had coughing and a temperature 100.3 and phlegm production. She also felt achy.    She was diagnosed with a bronchitis.   She saw Urgent Care 6 days ago and was placed on doxycycline (now on day 6/10).  CXR was reportedly fine.    She has had no fevers the past week.   She feels better this week than last week though not quite back to baseline.    Currently, she feels her gait is doing well. The right-sided weakness resolved. There is no difficulty with bladder function. Mood and cognition are doing well.  ______________________________________ From Initial visit 07/05/2016:  On 05/25/2015, she developed right-sided weakness and left facial droop. MRI of the brain with and without contrast showed multiple nonenhancing white matter lesions predominantly in the periventricular white matter. Of note, they were not present on an MRI from 01/24/2014. MRI of the cervical spine did not show any abnormality.   A lumbar puncture was performed and she had 3 oligoclonal bands.  She had a five-day course of IV Solu-Medrol. ESR  and CRP were negative/normal.     In retrospect, a couple months before this exacerbation and hospital admission, she had right visual blurring x 2 weeks. She saw ophthalmology and had a normal examination.   In June 2017, she started [place].   On her first dose, she had a blistering rash and stopped.    Dr. Tomi Likens discuss Gil[place] with her but due to her sinus tachycardia and need to be on metoprolol, Gilenya is contraindicated.    Tysabri was discussed with her and initially she was going to start the medication. However, she is very concerned about the possibility of PML even though she is JCV antibody negative. Therefore, she did not want to start. She has not been on any disease modifying therapy for the past year. She has not had any exacerbations to her knowledge.  Gait/strength/sensation: She denies any difficulty with her gait or balance. She does not have any weakness and denies any numbness.  Bladder/bowel: She denies any difficulty with urinary urgency, frequency, hesitancy or incontinence. There are no bowel issues.  Vision: Her visual blurring that occurred in early 2017 completely resolved after a couple weeks. She does not note any difficulty with color vision. There is no diplopia. There is no eye pain.  Fatigue/sleep: She notes having some physical fatigue off-and-on, especially when she is hot. This is generally mild and does not affect her ability to do necessary activities. She generally sleeps well.  Mood/cognition: She denies any significant problems with depression and or anxiety in general. However, she has had  some anxiety in relation to her MS and her treatment options. She denies any difficulty with cognitive issues.  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     These started as a teenager and are associated with hr menstrual cycle.    She had an especially bad one January 2016 and was admitted to the hospital.  An MRI of the  brain was normal at that time.    Mid back pain: She also has midline back pain in the mid to lower thoracic region.  Pain is worse with bending.   Ibuprofen 400 mg helps slightly.,   Hot compresses also help some.   Pain stays axial with no radiation into legs.  She has seen [facility] orthopedics about this.   PT did not help her back any.     MRI of the thoracic spine did not show any significant degenerative changes. 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  Other pertinent medical issues: She has sinus tachycardia and is on metoprolol. Dr. Tomi Likens had discussed [place] with her cardiologist but due to the possible effect on heart rate and possibility of causing a heart block in combination with some cardiac medication, [place] is contraindicated.    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 Her last 2 LFTs over the past year have been normal. However, 2 years ago the ALT was elevated to more than 300.  I personally reviewed multiple MRIs including an MRI of the brain from 01/24/2014, MRI of the brain from 05/25/2015, MRIs of the cervical spine 05/26/2015 and 04/17/2016 an MRI of the thoracic spine 06/16/2015. The earlier MRI of the brain was essentially normal. However, 16 months later the 2017 MRI showed several periventricular foci, [place] which were hyperintense on diffusion-weighted images. None of the foci enhanced after contrast. She does not have any plaque in the cervical or thoracic spinal cord.  REVIEW OF SYSTEMS: Constitutional: No fevers, chills, sweats, or change in appetite.  She has mild fatige Eyes: No visual changes, double vision, eye pain Ear, nose and throat: No hearing loss, ear pain, nasal congestion, sore throat Cardiovascular: No chest pain, palpitations.   History of sinus tachycardia Respiratory: No shortness of breath at rest or with exertion.   No  wheezes.   Has coughing GastrointestinaI: No nausea, vomiting, diarrhea, abdominal pain, fecal incontinence.   She has had several episodes of elevated liver function enzymes Genitourinary: No dysuria, urinary retention or frequency.  No nocturia. Musculoskeletal: She has thoracic back pain Integumentary: No rash, pruritus, skin lesions Neurological: as above Psychiatric: No depression at this time.  Mild anxiety Endocrine: No palpitations, diaphoresis, change in appetite, change in weigh or increased thirst Hematologic/Lymphatic: No anemia, purpura, petechiae. Allergic/Immunologic: No itchy/runny eyes, nasal congestion, recent allergic reactions, rashes  ALLERGIES: Allergies  Allergen Reactions  . Albuterol Anaphylaxis  . Hydrocodone Anaphylaxis and Nausea And Vomiting  . Codeine Nausea And Vomiting  . Lortab [Hydrocodone-Acetaminophen] Nausea And Vomiting    Patient can tolerate acetaminophen solely  . Morphine And Related Swelling and Rash  . Tecfidera [Dimethyl Fumarate] Hives and Swelling    HOME MEDICATIONS:  Current Outpatient Prescriptions:  .  acetaminophen (TYLENOL) 500 MG tablet, Take 1,000 mg by mouth every 6 (six) hours as needed for fever., Disp: , Rfl:  .  ibuprofen (ADVIL,MOTRIN) 200 MG tablet, Take 200 mg by mouth  every 6 (six) hours as needed for moderate pain. , Disp: , Rfl:  .  metoprolol tartrate (LOPRESSOR) 25 MG tablet, Take 25 mg by mouth 2 (two) times daily., Disp: , Rfl:  .  ocrelizumab 600 mg in sodium chloride 0.9 % 500 mL, Inject 600 mg into the vein once., Disp: , Rfl:  .  promethazine (PHENERGAN) 12.5 MG tablet, Take 1 tablet (12.5 mg total) by mouth every 6 (six) hours as needed for nausea or vomiting., Disp: 30 tablet, Rfl: 0 .  Vitamin D, Ergocalciferol, (DRISDOL) 50000 units CAPS capsule, Take 1 capsule (50,000 Units total) by mouth every 7 (seven) days., Disp: 12 capsule, Rfl: 0 .  doxycycline (VIBRAMYCIN) 100 MG capsule, , Disp: , Rfl:  0  Current Facility-Administered Medications:  .  0.9 %  sodium chloride infusion, 500 mL, Intravenous, Continuous, Armbruster, Renelda Loma, MD  Facility-Administered Medications Ordered in Other Visits:  .  gadopentetate dimeglumine (MAGNEVIST) injection 10 mL, 10 mL, Intravenous, Once PRN, Mahi Zabriskie, Nanine Means, MD  PAST MEDICAL HISTORY: Past Medical History:  Diagnosis Date  . Anxiety   . Elevated LFTs   . GERD (gastroesophageal reflux disease)   . Headache   . Hemangioma    as a child; had liver biopsy but unsure of results  . MS (multiple sclerosis) ([place]) 05/2015  . Neuromuscular disorder ([place])    MS  . Ovarian cyst   . PSVT (paroxysmal supraventricular tachycardia) ([place])   . Vitamin D deficiency     PAST SURGICAL HISTORY: Past Surgical History:  Procedure Laterality Date  . CESAREAN SECTION  2010, 2012  . LAPAROSCOPIC CHOLECYSTECTOMY  2008  . TONSILLECTOMY AND ADENOIDECTOMY  1992  . TYMPANOSTOMY [place], 2003    FAMILY HISTORY: Family History  Problem Relation Age of Onset  . Heart disease Mother   . Diabetes Father   . Lung cancer Maternal Grandmother   . Diabetes Maternal Grandmother   . Asthma Maternal Grandmother   . Breast cancer Maternal Grandmother   . Cancer Maternal Uncle        type unknown  . Diabetes Maternal Grandfather   . Diabetes Paternal Grandmother   . Diabetes Paternal Grandfather   . Healthy Sister     SOCIAL HISTORY:  Social History   Social History  . Marital status: Married    Spouse name: N/A  . Number of children: 2  . Years of education: N/A   Occupational History  . stay at home mom    Social History Main Topics  . Smoking status: Never Smoker  . Smokeless tobacco: Never Used  . Alcohol use No  . Drug use: No  . Sexual activity: Yes    Partners: Male    Birth control/ protection: Other-see comments   Other Topics Concern  . Not on file   Social History Narrative   ** Merged History Encounter **          PHYSICAL EXAM  Vitals:   09/05/16 1349  BP: 131/79  Pulse: 83  Resp: 16  Temp: 98.3 F (36.8 C)  Weight: 110 lb (49.9 kg)  Height: '4\' 8"'$  (1.422 m)    Body mass index is 24.66 kg/m.   General: The patient is well-developed and well-nourished and in no acute distress.   Lungs are clear.  Neurologic Exam  Mental status: The patient is alert and oriented x 3 at the time of the examination. The patient has apparent normal recent and remote memory, with  an apparently normal attention span and concentration ability.   Speech is normal.  Cranial nerves: Extraocular movements are full. Facial strength and sensation is normal. Trapezius strength is normal. No dysarthria is noted.   No obvious hearing deficits are noted.  Motor:  Muscle bulk is normal.   Tone is normal. Strength is  5 / 5 in all 4 extremities.   Sensory: Sensory testing is intact to pinprick, soft touch and vibration sensation in all 4 extremities.  Coordination: Cerebellar testing reveals good finger-nose-finger and heel-to-shin bilaterally.  Gait and station: Station is normal.   Gait is normal. Tandem gait is normal. Romberg is negative.   Reflexes: Deep tendon reflexes are symmetric and normal bilaterally.       DIAGNOSTIC DATA (LABS, IMAGING, TESTING) - I reviewed patient records, labs, notes, testing and imaging myself where available.  Lab Results  Component Value Date   WBC 9.1 07/05/2016   HGB 15.0 07/05/2016   HCT 43.7 07/05/2016   MCV 91 07/05/2016   PLT 253 07/05/2016      Component Value Date/Time   NA 138 04/01/2016 0302   K 3.5 04/01/2016 0302   CL 111 04/01/2016 0302   CO2 17 (L) 04/01/2016 0302   GLUCOSE 127 (H) 04/01/2016 0302   BUN 8 04/01/2016 0302   CREATININE 0.64 04/01/2016 0302   CALCIUM 9.5 04/01/2016 0302   PROT 6.9 07/05/2016 1449   ALBUMIN 4.8 07/05/2016 1449   AST 14 07/05/2016 1449   ALT 11 07/05/2016 1449   ALKPHOS 46 07/05/2016 1449   BILITOT 0.4  07/05/2016 1449   GFRNONAA >60 04/01/2016 0302   GFRAA >60 04/01/2016 0302   Lab Results  Component Value Date   CHOL 179 05/26/2015   HDL 57 05/26/2015   LDLCALC 103 (H) 05/26/2015   TRIG 93 05/26/2015   CHOLHDL 3.1 05/26/2015   Lab Results  Component Value Date   HGBA1C 4.8 05/26/2015   No results found for: [id number] Lab Results  Component Value Date   TSH 1.180 04/14/2013       ASSESSMENT AND PLAN   Multiple sclerosis (HCC)  High risk medication use  Bronchitis   1.   She is improving after bronchitis. She should complete the doxycycline but can get her ocrelizumab as scheduled tomorrow. 2.    Continue to be active and exercises tolerated. 3.    Return to see me in 4 months or sooner if there are new or worsening neurologic symptoms.  Imara Standiford A. Felecia Shelling, MD, [facility]   8/41/6606, 3:01 PM Certified in Neurology, Clinical Neurophysiology, Sleep Medicine, Pain Medicine and Neuroimaging Dir., [facility] center [facility]  [facility] [address], [place], [place] [address] [phone]

## 2016-09-14 ENCOUNTER — Telehealth: Payer: Self-pay | Admitting: Neurology

## 2016-09-14 NOTE — Telephone Encounter (Signed)
error 

## 2016-09-14 NOTE — Telephone Encounter (Signed)
I have spoken with Amy Rivas and per RAS, advised that if she has any respiratory infection following her next Ocrevus infusion in 6 mos., he may want to discuss other tx. options. She verbalized understanding of same/fim

## 2016-09-14 NOTE — Telephone Encounter (Signed)
Pt called she had ocrevus infusion last Wednesday 8/22. She developed upper respiratory problems a few days ago and was dx with sinusitis and an upper respiratory infection today and she placed on amoxicillin. Her PCP wanted her to check with neurologist since the 1st half of the infusion she got pneumonia. Please call discuss at [phone]

## 2016-10-06 ENCOUNTER — Ambulatory Visit: Payer: BLUE CROSS/BLUE SHIELD | Admitting: Neurology

## 2016-10-21 ENCOUNTER — Other Ambulatory Visit: Payer: Self-pay | Admitting: Neurology

## 2016-10-27 ENCOUNTER — Emergency Department (HOSPITAL_COMMUNITY)
Admission: EM | Admit: 2016-10-27 | Discharge: 2016-10-27 | Disposition: A | Discharge status: Home / Self Care | Payer: BLUE CROSS/BLUE SHIELD | Attending: Emergency Medicine | Admitting: Emergency Medicine

## 2016-10-27 ENCOUNTER — Encounter (HOSPITAL_COMMUNITY): Payer: Self-pay | Admitting: Emergency Medicine

## 2016-10-27 DIAGNOSIS — Z79899 Other long term (current) drug therapy: Secondary | ICD-10-CM | POA: Insufficient documentation

## 2016-10-27 DIAGNOSIS — M542 Cervicalgia: Secondary | ICD-10-CM | POA: Insufficient documentation

## 2016-10-27 DIAGNOSIS — R51 Headache: Secondary | ICD-10-CM | POA: Insufficient documentation

## 2016-10-27 DIAGNOSIS — G35 Multiple sclerosis: Secondary | ICD-10-CM | POA: Insufficient documentation

## 2016-10-27 DIAGNOSIS — R509 Fever, unspecified: Secondary | ICD-10-CM | POA: Diagnosis present

## 2016-10-27 LAB — CSF CELL COUNT WITH DIFFERENTIAL
RBC Count, CSF: 0 /mm3
RBC Count, CSF: 2 /mm3 — ABNORMAL HIGH
Tube #: 1
Tube #: 4
WBC, CSF: 1 /mm3 (ref 0–5)
WBC, CSF: 1 /mm3 (ref 0–5)

## 2016-10-27 LAB — CBC WITH DIFFERENTIAL/PLATELET
BASOS ABS: 0 10*3/uL (ref 0.0–0.1)
Basophils Relative: 0 %
EOS ABS: 0.1 10*3/uL (ref 0.0–0.7)
EOS PCT: 1 %
HCT: 43.3 % (ref 36.0–46.0)
Hemoglobin: 15 g/dL (ref 12.0–15.0)
Lymphocytes Relative: 26 %
Lymphs Abs: 1.6 10*3/uL (ref 0.7–4.0)
MCH: 30.7 pg (ref 26.0–34.0)
MCHC: 34.6 g/dL (ref 30.0–36.0)
MCV: 88.5 fL (ref 78.0–100.0)
Monocytes Absolute: 0.5 10*3/uL (ref 0.1–1.0)
Monocytes Relative: 9 %
Neutro Abs: 3.9 10*3/uL (ref 1.7–7.7)
Neutrophils Relative %: 64 %
PLATELETS: 273 10*3/uL (ref 150–400)
RBC: 4.89 MIL/uL (ref 3.87–5.11)
RDW: 12.3 % (ref 11.5–15.5)
WBC: 6 10*3/uL (ref 4.0–10.5)

## 2016-10-27 LAB — RAPID STREP SCREEN (MED CTR MEBANE ONLY): STREPTOCOCCUS, GROUP A SCREEN (DIRECT): NEGATIVE

## 2016-10-27 LAB — COMPREHENSIVE METABOLIC PANEL
ALT: 12 U/L — AB (ref 14–54)
AST: 19 U/L (ref 15–41)
Albumin: 4.3 g/dL (ref 3.5–5.0)
Alkaline Phosphatase: 45 U/L (ref 38–126)
Anion gap: 8 (ref 5–15)
BILIRUBIN TOTAL: 0.8 mg/dL (ref 0.3–1.2)
BUN: 10 mg/dL (ref 6–20)
CO2: 20 mmol/L — ABNORMAL LOW (ref 22–32)
CREATININE: 0.7 mg/dL (ref 0.44–1.00)
Calcium: 9.2 mg/dL (ref 8.9–10.3)
Chloride: 109 mmol/L (ref 101–111)
GFR calc Af Amer: >60 mL/min (ref >60)
Glucose, Bld: 91 mg/dL (ref 65–99)
Potassium: 3.9 mmol/L (ref 3.5–5.1)
Sodium: 137 mmol/L (ref 135–145)
TOTAL PROTEIN: 6.8 g/dL (ref 6.5–8.1)

## 2016-10-27 LAB — I-STAT CG4 LACTIC ACID, ED
LACTIC ACID, VENOUS: 0.77 mmol/L (ref 0.5–1.9)
Lactic Acid, Venous: 1.09 mmol/L (ref 0.5–1.9)

## 2016-10-27 LAB — PROTEIN AND GLUCOSE, CSF
GLUCOSE CSF: 49 mg/dL (ref 40–70)
TOTAL PROTEIN, CSF: 20 mg/dL (ref 15–45)

## 2016-10-27 MED ORDER — SODIUM CHLORIDE 0.9 % IV BOLUS (SEPSIS)
1000.0000 mL | Freq: Once | INTRAVENOUS | Status: AC
  Administered 2016-10-27: 1000 mL via INTRAVENOUS

## 2016-10-27 MED ORDER — LIDOCAINE HCL (PF) 1 % IJ SOLN
30.0000 mL | Freq: Once | INTRAMUSCULAR | Status: AC
  Administered 2016-10-27: 30 mL via INTRADERMAL
  Filled 2016-10-27: qty 30

## 2016-10-27 MED ORDER — KETOROLAC TROMETHAMINE 30 MG/ML IJ SOLN
30.0000 mg | Freq: Once | INTRAMUSCULAR | Status: AC
  Administered 2016-10-27: 30 mg via INTRAVENOUS
  Filled 2016-10-27: qty 1

## 2016-10-27 MED ORDER — ONDANSETRON 4 MG PO TBDP
ORAL_TABLET | ORAL | Status: AC
  Filled 2016-10-27: qty 1

## 2016-10-27 MED ORDER — METHOCARBAMOL 500 MG PO TABS: 500.0000 mg | ORAL_TABLET | Freq: Three times a day (TID) | ORAL | 0 refills | Status: AC | PRN

## 2016-10-27 MED ORDER — ONDANSETRON 4 MG PO TBDP
4.0000 mg | ORAL_TABLET | Freq: Once | ORAL | Status: AC
  Administered 2016-10-27: 4 mg via ORAL

## 2016-10-27 NOTE — Discharge Instructions (Signed)
You presented to ED for neck pain, stiffness, fever, nausea, vomiting after a throat infection.   Based on your history, exam and medications we were concerned for possible meningitis.   Your lab work and lumbar puncture results were normal.  It is still unclear as to what is causing your neck pain. May be muscular in nature.  Please take 1000 mg tylenol + 600 mg ibuprofen + 500 mg robaxin (muscle relaxer) for the next 3-5 days.  Place a heating pad on your neck. Light stretches under a hot shower and massage may also help.   Schedule an appointment with your primary care provider in 2-3 days for re-evaluation and to ensure your symptoms are improving.   Return to ED for worsening symptoms.   Be aware that a headache is a common side effect of a lumbar puncture; however dizziness, visual changes, numbness or weakness to your extremities is not

## 2016-10-27 NOTE — ED Triage Notes (Signed)
Pt rpeorts dx of strep on Monday, reports taking abx without relief, worsening neck pain with n/v.  Pt reports taking immunosuppressant for MS, reports taking 600 mg ibuprofen at 0815.

## 2016-10-27 NOTE — ED Provider Notes (Signed)
[facility] DEPT Provider Note   CSN: [id number] Arrival date & time: 10/27/16  3267     History   Chief Complaint Chief Complaint  Patient presents with  . Neck Pain  . Fever    HPI Amy Rivas is a 26 y.o. female with h/o multiple sclerosis (ocrelizumab infusion q6 months, last in august) presents with constant neck pain and stiffness that radiates to back of head since last night. Associated symptoms include headache, fever, nausea, vomiting. Presented to PCP with sore throat, rhinorrhea, fever, nausea 5 days ago and was diagnosed with strep throat (reports swab was negative), started on amoxicillin, has been compliant. Sore throat has improved. Neck pain worsened by neck movement. Has tried ibuprofen at home without relief.    Denies changes in vision, weakness, gait difficulties or any other symptoms typical of MS flares. Denies CP, cough, abdominal pain.   HPI  Past Medical History:  Diagnosis Date  . Anxiety   . Elevated LFTs   . GERD (gastroesophageal reflux disease)   . Headache   . Hemangioma    as a child; had liver biopsy but unsure of results  . MS (multiple sclerosis) ([place]) 05/2015  . Neuromuscular disorder ([place])    MS  . Ovarian cyst   . PSVT (paroxysmal supraventricular tachycardia) ([place])   . Vitamin D deficiency     Patient Active Problem List   Diagnosis Date Noted  . Bronchitis 09/05/2016  . 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 High risk medication use 07/05/2016  . Multiple sclerosis ([place]) 05/25/2015  . Acute right-sided weakness 05/25/2015  . Intractable headache 05/25/2015  . Adjustment disorder with anxious mood   . Solitary pulmonary nodule 12/21/2013  . Dyspnea 12/18/2013  . Elevated LFTs 04/15/2013  . Vomiting 04/14/2013  . Abdominal pain 04/14/2013  . Sinus tachycardia ([place]) 04/14/2013  . Nipple discharge 11/21/2012    Past Surgical History:    Procedure Laterality Date  . CESAREAN SECTION  2010, 2012  . LAPAROSCOPIC CHOLECYSTECTOMY  2008  . TONSILLECTOMY AND ADENOIDECTOMY  1992  . TYMPANOSTOMY [place], 2003    OB History    Gravida Para Term Preterm AB Living   2 2   2        SAB TAB Ectopic Multiple Live Births                   Home Medications    Prior to Admission medications   Medication Sig Start Date End Date Taking? Authorizing Provider  amoxicillin (AMOXIL) 875 MG tablet Take 875 mg by mouth every 12 (twelve) hours. 10 day course started 10/23/16 pm 10/23/16  Yes [provider]  ibuprofen (ADVIL,MOTRIN) 200 MG tablet Take 600 mg by mouth every 8 (eight) hours as needed (pain).    Yes [provider]  metoprolol tartrate (LOPRESSOR) 25 MG tablet Take 25 mg by mouth 2 (two) times daily. 05/03/15  Yes [provider]  ocrelizumab 600 mg in sodium chloride 0.9 % 500 mL Inject 600 mg into the vein every 6 (six) months. Last infusion 1st part of September 2018   Yes [provider]  methocarbamol (ROBAXIN) 500 MG tablet Take 1 tablet (500 mg total) by mouth every 8 (eight) hours as needed for muscle spasms. 10/27/16 11/01/16  Kinnie Feil, PA-C  promethazine (PHENERGAN) 12.5 MG tablet Take 1 tablet (12.5 mg total) by mouth every 6 (six) hours as  needed for nausea or vomiting. Patient not taking: Reported on 10/27/2016 08/16/16   Britt Bottom, MD  Vitamin D, Ergocalciferol, (DRISDOL) 50000 units CAPS capsule Take 1 capsule (50,000 Units total) by mouth every 7 (seven) days. Patient not taking: Reported on 10/27/2016 07/05/16   Britt Bottom, MD    Family History Family History  Problem Relation Age of Onset  . Heart disease Mother   . Diabetes Father   . Lung cancer Maternal Grandmother   . Diabetes Maternal Grandmother   . Asthma Maternal Grandmother   . Breast cancer Maternal Grandmother   . Cancer Maternal Uncle        type unknown  . Diabetes  Maternal Grandfather   . Diabetes Paternal Grandmother   . Diabetes Paternal Grandfather   . Healthy Sister     Social History Social History  Substance Use Topics  . Smoking status: Never Smoker  . Smokeless tobacco: Never Used  . Alcohol use No     Allergies   Albuterol; Hydrocodone; Codeine; Lortab [hydrocodone-acetaminophen]; Morphine and related; and Tecfidera [dimethyl fumarate]   Review of Systems Review of Systems  Constitutional: Positive for chills and fever.  HENT: Positive for congestion, rhinorrhea, sore throat and trouble swallowing. Negative for sinus pain and voice change.   Eyes: Negative for visual disturbance.  Respiratory: Negative for cough.   Cardiovascular: Negative for chest pain.  Gastrointestinal: Positive for nausea and vomiting. Negative for abdominal pain, constipation and diarrhea.  Genitourinary: Negative for difficulty urinating.  Musculoskeletal: Positive for myalgias, neck pain and neck stiffness.  Skin: Negative for rash.  Allergic/Immunologic: Positive for immunocompromised state.  Neurological: Positive for headaches. Negative for weakness and light-headedness.     Physical Exam Updated Vital Signs BP (!) 142/77   Pulse 91   Temp 98.3 F (36.8 C) (Oral)   Resp 16   Ht 4\' 8"  (1.422 m)   Wt 47.6 kg (105 lb)   LMP 09/26/2016 (Exact Date)   SpO2 98%   BMI 23.54 kg/m   Physical Exam  Constitutional: She is oriented to person, place, and time. She appears well-developed and well-nourished. No distress.  Non toxic  HENT:  Head: Normocephalic and atraumatic.  Nose: Nose normal.  Mouth/Throat: Oropharynx is clear and moist. No oropharyngeal exudate.  External ears and TMs normal bilaterally +Rhinorrhea. No sinus tenderness. +Oropharynx and tonsils mildly erythematous, no edema or exudates Moist mucous membranes No trismus, drooling, stridor, hot potato voice  Eyes: Pupils are equal, round, and reactive to light. Conjunctivae and  EOM are normal.  Neck: Trachea normal. Spinous process tenderness present. Neck rigidity present. Decreased range of motion present. Kernig's sign noted.  +Mild midline c-spine tenderness. No paraspinal muscle tenderness. +Decreased neck AROM secondary to pain +Neck pain with PROM, worst with flexion and extension +Positive Kernig sign Negative Brudzinski's sign  Cardiovascular: Regular rhythm, S1 normal, S2 normal, normal heart sounds and intact distal pulses.  Tachycardia present.   No murmur heard. Pulses:      Radial pulses are 2+ on the right side, and 2+ on the left side.       Dorsalis pedis pulses are 2+ on the right side, and 2+ on the left side.  Pulmonary/Chest: Effort normal and breath sounds normal. No respiratory distress. She has no decreased breath sounds. She has no wheezes. She exhibits no tenderness.  Abdominal: Soft. Bowel sounds are normal. She exhibits no distension and no mass. There is no tenderness. There is no rebound and no  guarding.  Musculoskeletal: She exhibits no deformity.  Neurological: She is alert and oriented to person, place, and time. No sensory deficit.  PERRL and EOMs normal bilaterally CN VII intact Sensation to light touch intact in hands and feet Strength 5/5 with hand grip and hip flexion  Skin: Skin is warm and dry. Capillary refill takes less than 2 seconds.  Psychiatric: She has a normal mood and affect. Her behavior is normal. Judgment and thought content normal.  Nursing note and vitals reviewed.    ED Treatments / Results  Labs (all labs ordered are listed, but only abnormal results are displayed) Labs Reviewed  COMPREHENSIVE METABOLIC PANEL - Abnormal; Notable for the following:       Result Value   CO2 20 (*)    ALT 12 (*)    All other components within normal limits  CSF CELL COUNT WITH DIFFERENTIAL - Abnormal; Notable for the following:    RBC Count, CSF 2 (*)    All other components within normal limits  RAPID STREP SCREEN  (NOT AT [facility])  CSF CULTURE  CULTURE, GROUP A STREP ([place])  CBC WITH DIFFERENTIAL/PLATELET  CSF CELL COUNT WITH DIFFERENTIAL  PROTEIN AND GLUCOSE, CSF  I-STAT CG4 LACTIC ACID, ED  I-STAT CG4 LACTIC ACID, ED    EKG  EKG Interpretation None       Radiology No results found.  Procedures Procedures (including critical care time)  Medications Ordered in ED Medications  ondansetron (ZOFRAN-ODT) disintegrating tablet 4 mg (4 mg Oral Given 10/27/16 1012)  sodium chloride 0.9 % bolus 1,000 mL (0 mLs Intravenous Stopped 10/27/16 1340)  sodium chloride 0.9 % bolus 1,000 mL (0 mLs Intravenous Stopped 10/27/16 1340)  ketorolac (TORADOL) 30 MG/ML injection 30 mg (30 mg Intravenous Given 10/27/16 1241)  lidocaine (PF) (XYLOCAINE) 1 % injection 30 mL (30 mLs Intradermal Given 10/27/16 1409)     Initial Impression / Assessment and Plan / ED Course  I have reviewed the triage vital signs and the nursing notes.  Pertinent labs & imaging results that were available during my care of the patient were reviewed by me and considered in my medical decision making (see chart for details).  Clinical Course as of Oct 28 1810  Fri Oct 27, 2016  1150 Secretary notified me pt HR dropped to mid 50s, pt endorsed dizziness. I evaluated pt, HR was 92. She endorses headache, neck pain and dizziness worse with movement. Will finish IVF and give analgesia and reassess. Dr. Lacinda Axon has evaluated pt.  [CG]  1505 LP done by Dr. Lacinda Axon with my assistance, sent sample to lab.  [CG]    Clinical Course User Index [CG] Kinnie Feil, PA-C   26 year old female with history of multiple sclerosis presents to ED with neck pain, stiffness, nausea, vomiting following questionable strep pharyngitis. Four days ago she was evaluated by PCP for sore throat, rapid strep reportedly negative however she was still treated with amoxicillin. Reports sore throat is better.   On exam, pt is HD stable. Afebrile. She is non toxic  appearing. Based on exam, clinical suspicion for deep neck tissue infection is low, she has mild erythema in oropharynx but other wise ENT exam is benign.  No cervical adenopathy. No trismus, stridor, drooling, hot potato voice. She is tolerating PO. She has mild C-spine tenderness, +Kernig's, and PROM of neck decreased secondary to pain. Pt was evaluated by supervising physician. Given h/o immunosuppression and exam, meningitis was thought to be more likely. CT soft tissue  neck deferred at this time.   Lab work including CBC, rapid strep, CMP normal. Lumbar puncture performed by Dr. Lacinda Axon. CSF work up benign. Pt was given IVF, toradol and zofran. She declined other analgesics. Her pain and neck ROM improved.    Final Clinical Impressions(s) / ED Diagnoses   Pt will be discharged with NSAIDs, robaxin for possible MSK etiology. Gave pt strict ED return precautions.  She is to schedule f/u appointment with PCP in 2 days for re-evaluation. She is aware of s/s that would warrant return to ED. She is aware the is at risk for deep neck tissue infection and is aware of s/s that would indicate this. She verbalized understanding and is agreeable with plan.   Final diagnoses:  Neck pain    New Prescriptions Discharge Medication List as of 10/27/2016  5:42 PM    START taking these medications   Details  methocarbamol (ROBAXIN) 500 MG tablet Take 1 tablet (500 mg total) by mouth every 8 (eight) hours as needed for muscle spasms., Starting Fri 10/27/2016, Until Wed 11/01/2016, Print         Kinnie Feil, PA-C 10/27/16 Cecille [place]    Nat Christen, MD 10/28/16 [phone]

## 2016-10-28 NOTE — ED Provider Notes (Signed)
  Physical Exam  BP (!) 142/77   Pulse 91   Temp 98.3 F (36.8 C) (Oral)   Resp 16   Ht 4\' 8"  (1.422 m)   Wt 47.6 kg (105 lb)   LMP 09/26/2016 (Exact Date)   SpO2 98%   BMI 23.54 kg/m   Physical Exam  ED Course  .Lumbar Puncture Date/Time: 10/28/2016 3:15 PM Performed by: Nat Christen Authorized by: Nat Christen   Consent:    Consent obtained:  Verbal and written   Consent given by:  Patient   Risks discussed:  Pain, repeat procedure and infection   Alternatives discussed:  Observation Pre-procedure details:    Procedure purpose:  Diagnostic Anesthesia (see MAR for exact dosages):    Anesthesia method:  Local infiltration   Local anesthetic:  Lidocaine 1% w/o epi Procedure details:    Lumbar space:  L3-L4 interspace   Needle type:  Spinal needle - Quincke tip   Ultrasound guidance: no     Number of attempts:  1   Fluid appearance:  Clear   Tubes of fluid:  4   Total volume (ml): 6.0. Post-procedure:    Puncture site:  Adhesive bandage applied   Patient tolerance of procedure:  Tolerated well, no immediate complications    MDM Patient presents with persistent sore neck, low-grade fever, no neurological deficits. Will obtain lumbar puncture to assess CSF.       Nat Christen, MD 10/28/16 [phone]

## 2016-10-29 LAB — CULTURE, GROUP A STREP (THRC)

## 2016-10-31 LAB — CSF CULTURE W GRAM STAIN

## 2016-10-31 LAB — CSF CULTURE: CULTURE: NO GROWTH

## 2016-11-03 ENCOUNTER — Ambulatory Visit
Admission: RE | Admit: 2016-11-03 | Discharge: 2016-11-03 | Disposition: A | Discharge status: Home / Self Care | Payer: BLUE CROSS/BLUE SHIELD | Source: Ambulatory Visit | Attending: Family Medicine | Admitting: Family Medicine

## 2016-11-03 ENCOUNTER — Other Ambulatory Visit: Payer: Self-pay | Admitting: Family Medicine

## 2016-11-03 DIAGNOSIS — M549 Dorsalgia, unspecified: Secondary | ICD-10-CM

## 2016-11-03 DIAGNOSIS — M25511 Pain in right shoulder: Secondary | ICD-10-CM

## 2016-11-03 DIAGNOSIS — R0781 Pleurodynia: Secondary | ICD-10-CM

## 2016-11-03 DIAGNOSIS — M542 Cervicalgia: Secondary | ICD-10-CM

## 2016-11-06 ENCOUNTER — Telehealth: Payer: Self-pay | Admitting: Neurology

## 2016-11-06 NOTE — Telephone Encounter (Signed)
Pt called she was seen at ED for stiff neck,vomiting and HA on 10/12. Pt has seen PCP 10/16 who advised her to see neurologist. Please call to discuss an appt.

## 2016-11-06 NOTE — Telephone Encounter (Signed)
Spoke with [employer]. She c/o h/a onset several wks ago--seen at the hospital and LP done. Appt. given with RAS 11/07/16/fim

## 2016-11-07 ENCOUNTER — Ambulatory Visit (INDEPENDENT_AMBULATORY_CARE_PROVIDER_SITE_OTHER): Payer: BLUE CROSS/BLUE SHIELD | Admitting: Neurology

## 2016-11-07 ENCOUNTER — Encounter: Payer: Self-pay | Admitting: Neurology

## 2016-11-07 VITALS — BP 108/76 | HR 72 | Temp 98.7°F | Resp 18 | Ht <= 58 in | Wt 110.5 lb

## 2016-11-07 DIAGNOSIS — R519 Headache, unspecified: Secondary | ICD-10-CM

## 2016-11-07 DIAGNOSIS — G971 Other reaction to spinal and lumbar puncture: Secondary | ICD-10-CM

## 2016-11-07 DIAGNOSIS — G35D Multiple sclerosis, unspecified: Secondary | ICD-10-CM

## 2016-11-07 DIAGNOSIS — M549 Dorsalgia, unspecified: Secondary | ICD-10-CM

## 2016-11-07 DIAGNOSIS — G35 Multiple sclerosis: Secondary | ICD-10-CM | POA: Diagnosis not present

## 2016-11-07 DIAGNOSIS — M542 Cervicalgia: Secondary | ICD-10-CM

## 2016-11-07 DIAGNOSIS — R51 Headache: Secondary | ICD-10-CM | POA: Diagnosis not present

## 2016-11-07 DIAGNOSIS — G8929 Other chronic pain: Secondary | ICD-10-CM

## 2016-11-07 DIAGNOSIS — R5383 Other fatigue: Secondary | ICD-10-CM

## 2016-11-07 HISTORY — DX: Other reaction to spinal and lumbar puncture: G97.1

## 2016-11-07 NOTE — Progress Notes (Signed)
[facility]  PATIENT: Amy Rivas DOB: 1990-07-01  REFERRING DOCTOR OR PCP:  Eloise Levels (PCP); Metta Clines (Neurology) SOURCE: Patient, notes from 2017 emergency room/admission and notes from Dr. Tomi Likens, laboratory and imaging results, MRI images on PACS  _________________________________   HISTORICAL  CHIEF COMPLAINT:  Chief Complaint  Patient presents with  . Multiple Sclerosis    Had parts A and B of first Ocrevus infusion on 08/23/16 and 09/06/16.  Here today with h/a, neck pain and stiffness, 3 days of fever of 103,  onset several weeks ago.  Seen by pcp--strep cx. negative.  She completed course of Amoxicillin.  Denies injury, sts. nobody in her family has been ill.  Seen at [facility] ED on 10/27/16, had LP and lab work, with no clear dx. reached. Here today with continued neck pain/stiffness, h/a, which improves with lying down.  No fever since 10/27/16/fim  . Headache    HISTORY OF PRESENT ILLNESS:  Amy Rivas Is a 26 year old woman with multiple sclerosis diagnosed in 2017 after presenting right-sided weakness and left facial droop.  Update 11/07/2016:    Two weeks ago, she had a fever and stiff neck.  Her PCP started an antibiotic.  She went to the ED 10/27/2016 and had an LP showing only 1 WBC and 2 RBC (normal).    After the LP, she woke up the next day with worsening pain in her neck and upper back.  CT of the cervical and thoracic (normal).      She continues to have a positional headache worse when she is sitting for more than a few minutes or when she stands. When the headache intensifies she also gets more pain in her neck and her back if she lays down the headache improves after a few minutes.  She denies any change in vision.  She feels her MS is stable. She tolerated the initial ocrelizumab infusion in August 2018.   She feels gait, strength and sensation are doing well. Bladder function is fine. She does not note any vision changes. She  continues to report fatigue, worse in the afternoons.   Update 09/05/2016:   She had ocrelizumab 13 days ago.   About 10-11 days ago, she had coughing and a temperature 100.3 and phlegm production. She also felt achy.    She was diagnosed with a bronchitis.   She saw Urgent Care 6 days ago and was placed on doxycycline (now on day 6/10).  CXR was reportedly fine.    She has had no fevers the past week.   She feels better this week than last week though not quite back to baseline.    Currently, she feels her gait is doing well. The right-sided weakness resolved. There is no difficulty with bladder function. Mood and cognition are doing well.  ______________________________________ From Initial visit 07/05/2016:  On 05/25/2015, she developed right-sided weakness and left facial droop. MRI of the brain with and without contrast showed multiple nonenhancing white matter lesions predominantly in the periventricular white matter. Of note, they were not present on an MRI from 01/24/2014. MRI of the cervical spine did not show any abnormality.   A lumbar puncture was performed and she had 3 oligoclonal bands.  She had a five-day course of IV Solu-Medrol. ESR and CRP were negative/normal.     In retrospect, a couple months before this exacerbation and hospital admission, she had right visual blurring x 2 weeks. She saw ophthalmology and had a normal examination.  In June 2017, she started [place].   On her first dose, she had a blistering rash and stopped.    Dr. Tomi Likens discuss Gilenya with her but due to her sinus tachycardia and need to be on metoprolol, Gilenya is contraindicated.    Tysabri was discussed with her and initially she was going to start the medication. However, she is very concerned about the possibility of PML even though she is JCV antibody negative. Therefore, she did not want to start. She has not been on any disease modifying therapy for the past year. She has not had any exacerbations to her  knowledge.  Gait/strength/sensation: She denies any difficulty with her gait or balance. She does not have any weakness and denies any numbness.  Bladder/bowel: She denies any difficulty with urinary urgency, frequency, hesitancy or incontinence. There are no bowel issues.  Vision: Her visual blurring that occurred in early 2017 completely resolved after a couple weeks. She does not note any difficulty with color vision. There is no diplopia. There is no eye pain.  Fatigue/sleep: She notes having some physical fatigue off-and-on, especially when she is hot. This is generally mild and does not affect her ability to do necessary activities. She generally sleeps well.  Mood/cognition: She denies any significant problems with depression and or anxiety in general. However, she has had some anxiety in relation to her MS and her treatment options. She denies any difficulty with cognitive issues.  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     These started as a teenager and are associated with hr menstrual cycle.    She had an especially bad one January 2016 and was admitted to the hospital.  An MRI of the brain was normal at that time.    Mid back pain: She also has midline back pain in the mid to lower thoracic region.  Pain is worse with bending.   Ibuprofen 400 mg helps slightly.,   Hot compresses also help some.   Pain stays axial with no radiation into legs.  She has seen [facility] orthopedics about this.   PT did not help her back any.     MRI of the thoracic spine did not show any significant degenerative changes. 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  Other pertinent medical issues: She has sinus tachycardia and is on metoprolol. Dr. Tomi Likens had discussed [place] with her cardiologist but due to the possible effect on heart rate and possibility of causing a heart block in combination with some cardiac  medication, Gilenya is contraindicated.    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 Her last 2 LFTs over the past year have been normal. However, 2 years ago the ALT was elevated to more than 300.  I personally reviewed multiple MRIs including an MRI of the brain from 01/24/2014, MRI of the brain from 05/25/2015, MRIs of the cervical spine 05/26/2015 and 04/17/2016 an MRI of the thoracic spine 06/16/2015. The earlier MRI of the brain was essentially normal. However, 16 months later the 2017 MRI showed several periventricular foci, Soma which were hyperintense on diffusion-weighted images. None of the foci enhanced after contrast. She does not have any plaque in the cervical or thoracic spinal cord.  REVIEW OF SYSTEMS: Constitutional: No fevers, chills, sweats, or change in appetite.  She has mild fatige Eyes: No visual changes, double  vision, eye pain Ear, nose and throat: No hearing loss, ear pain, nasal congestion, sore throat Cardiovascular: No chest pain, palpitations.   History of sinus tachycardia Respiratory: No shortness of breath at rest or with exertion.   No wheezes.   Has coughing GastrointestinaI: No nausea, vomiting, diarrhea, abdominal pain, fecal incontinence.   She has had several episodes of elevated liver function enzymes Genitourinary: No dysuria, urinary retention or frequency.  No nocturia. Musculoskeletal: She has thoracic back pain Integumentary: No rash, pruritus, skin lesions Neurological: as above Psychiatric: No depression at this time.  Mild anxiety Endocrine: No palpitations, diaphoresis, change in appetite, change in weigh or increased thirst Hematologic/Lymphatic: No anemia, purpura, petechiae. Allergic/Immunologic: No itchy/runny eyes, nasal congestion, recent allergic reactions, rashes  ALLERGIES: Allergies  Allergen Reactions  . Albuterol Anaphylaxis  . Hydrocodone Anaphylaxis and  Nausea And Vomiting  . Codeine Nausea And Vomiting  . Lortab [Hydrocodone-Acetaminophen] Nausea And Vomiting    Patient can tolerate acetaminophen solely  . Morphine And Related Swelling and Rash  . Tecfidera [Dimethyl Fumarate] Hives and Swelling    HOME MEDICATIONS:  Current Outpatient Prescriptions:  .  ibuprofen (ADVIL,MOTRIN) 200 MG tablet, Take 600 mg by mouth every 8 (eight) hours as needed (pain). , Disp: , Rfl:  .  metoprolol tartrate (LOPRESSOR) 25 MG tablet, Take 25 mg by mouth 2 (two) times daily., Disp: , Rfl:  .  ocrelizumab 600 mg in sodium chloride 0.9 % 500 mL, Inject 600 mg into the vein every 6 (six) months. Last infusion 1st part of September 2018, Disp: , Rfl:   Current Facility-Administered Medications:  .  0.9 %  sodium chloride infusion, 500 mL, Intravenous, Continuous, Armbruster, Carlota Raspberry, MD  Facility-Administered Medications Ordered in Other Visits:  .  gadopentetate dimeglumine (MAGNEVIST) injection 10 mL, 10 mL, Intravenous, Once PRN, Dyllan Kats, Nanine Means, MD  PAST MEDICAL HISTORY: Past Medical History:  Diagnosis Date  . Anxiety   . Elevated LFTs   . GERD (gastroesophageal reflux disease)   . Headache   . Hemangioma    as a child; had liver biopsy but unsure of results  . MS (multiple sclerosis) ([place]) 05/2015  . Neuromuscular disorder ([place])    MS  . Ovarian cyst   . 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 PSVT (paroxysmal supraventricular tachycardia) ([place])   . Vitamin D deficiency     PAST SURGICAL HISTORY: Past Surgical History:  Procedure Laterality Date  . CESAREAN SECTION  2010, 2012  . LAPAROSCOPIC CHOLECYSTECTOMY  2008  . TONSILLECTOMY AND ADENOIDECTOMY  1992  . TYMPANOSTOMY [place], 2003    FAMILY HISTORY: Family History  Problem Relation Age of Onset  . Heart disease Mother   . Diabetes Father   . Lung cancer Maternal Grandmother   . Diabetes Maternal Grandmother   . Asthma Maternal Grandmother   .  Breast cancer Maternal Grandmother   . Cancer Maternal Uncle        type unknown  . Diabetes Maternal Grandfather   . Diabetes Paternal Grandmother   . Diabetes Paternal Grandfather   . Healthy Sister     SOCIAL HISTORY:  Social History   Social History  . Marital status: Married    Spouse name: N/A  . Number of children: 2  . Years of education: N/A   Occupational History  . stay at home mom    Social History Main Topics  . Smoking status: Never Smoker  . Smokeless tobacco: Never Used  .  Alcohol use No  . Drug use: No  . Sexual activity: Yes    Partners: Male    Birth control/ protection: Other-see comments   Other Topics Concern  . Not on file   Social History Narrative   ** Merged History Encounter **         PHYSICAL EXAM  Vitals:   11/07/16 0908  BP: 108/76  Pulse: 72  Resp: 18  Temp: 98.7 F (37.1 C)  Weight: 110 lb 8 oz (50.1 kg)  Height: '4\' 8"'$  (1.422 m)    Body mass index is 24.77 kg/m.   General: The patient is well-developed and well-nourished and in no acute distress.   Lungs are clear.  HEENT:  Head is normocephalic and atraumatic. Pharynx is nonerythematous. Funduscopic examination shows normal optic discs and retinal vessels.  Neurologic Exam  Mental status: The patient is alert and oriented x 3 at the time of the examination. The patient has apparent normal recent and remote memory, with an apparently normal attention span and concentration ability.   Speech is normal.  Cranial nerves: Extraocular movements are full. Facial strength and sensation is normal. Trapezius strength is normal. No dysarthria is noted.   No obvious hearing deficits are noted.  Motor:  Muscle bulk is normal.   Tone is normal. Strength is  5 / 5 in all 4 extremities.   Coord:   Finger-nose-finger and heel-to-shin is performed well bilaterally.  Gait:   She has a normal gait. The tandem gait is wide.  Sensory: Sensory testing is intact to pinprick, soft  touch and vibration sensation in all 4 extremities.  DTRs:  Deep tendon reflexes are 2 in the arms and 3 of the legs    PROCEDURE Epidural blood patch at L3L4  The risks, alternatives and benefits of an epidural blood patch was discussed with her. The back was prepped and draped. The skin over the L3-L4 interspace was numbed with 1% lidocaine. A 20-gauge Touhy needle was slowly advanced. Using the loss of resistance technique with sterile saline, the epidural space was identified.  6 mL of autologous blood was slowly injected. The needle was removed intact. The patient tolerated the procedure well.  There were no complications. She was placed in a supine position for 45 minutes before being discharged.       ASSESSMENT AND PLAN   Multiple sclerosis (HCC)  Chronic intractable headache, unspecified headache type  Post lumbar puncture headache  Neck pain  Other fatigue  Mid back pain   1.   She appears to have a post lumbar puncture headache, much worse when sitting or standing than when she is laying down. An epidural blood patch was performed at the L3-L4 level with 6 mL of autologous blood and she tolerated the procedure well.. 2.    Progressive day she should try to lay down and drink plenty of fluids. 3.   Ocrelizumab with her next dose in April 2019. 3.    Return to see me in 4 months or sooner if there are new or worsening neurologic symptoms.  Kelcy Laible A. Felecia Shelling, MD, [facility]   27/51/7001, 74:94 PM Certified in Neurology, Clinical Neurophysiology, Sleep Medicine, Pain Medicine and Neuroimaging Dir., [facility] center [facility]  [facility] [address], [place], [place] [address] [phone]

## 2016-11-17 ENCOUNTER — Telehealth: Payer: Self-pay | Admitting: Neurology

## 2016-11-17 MED ORDER — MELOXICAM 15 MG PO TABS: 15.0000 mg | ORAL_TABLET | Freq: Every day | ORAL | 0 refills | Status: DC

## 2016-11-17 NOTE — Telephone Encounter (Signed)
Spoke with [employer]. She sts. h/a is much improved since blood patch, but still some present, but relieved with Ibuprofen.  Also has some right sided facial pain--tender to touch under right eye, right ear, but this is also relieved with Ibuprofen. Some right sided facial tingling onset this am, not getting worse so far. Decreased hearing right ear. Per RAS, ok to try Meloxicam 15mg  po daily prn, see if h/a remains resolved and other sx. improve.  Pt. is agreeable with this plan. Rx. escribed to [facility] per her request/fim

## 2016-11-17 NOTE — Telephone Encounter (Signed)
Pt is scheduled to see her PCP at 11:45 this morning due to a bad head ache she has had.  Pt states she is now experiencing tingling and pain on right side of head and face.  Pt also states she is unable to hear on right side.  Pt would like a call to know if she should got to PCP or if Dr Felecia Shelling will suggest otherwise

## 2016-11-17 NOTE — Addendum Note (Signed)
Addended by: France Ravens I on: 11/17/2016 10:32 AM   Modules accepted: Orders

## 2016-11-19 ENCOUNTER — Encounter: Payer: Self-pay | Admitting: Neurology

## 2016-11-20 ENCOUNTER — Telehealth: Payer: Self-pay | Admitting: *Deleted

## 2016-11-20 MED ORDER — INDOMETHACIN 20 MG PO CAPS: 25.0000 mg | ORAL_CAPSULE | Freq: Three times a day (TID) | ORAL | 0 refills | Status: DC

## 2016-11-20 NOTE — Telephone Encounter (Signed)
After speaking with RAS, I spoke with Amy Rivas this afternoon and explained that RAS is not sure why she is still having a  h/a.  Blood patch was done easily, without complications, and h/a did improved afterwards. There are not other sx. present--no change in cognition, no evidence of optic neuritis. No injuries. Pt. can stop Meloxicam and try Indomethacin 25mg  tid with meals. No otc NSAIDS while taking Indomethacin.  She is agreeable with this plan.  [facility] request/fim

## 2016-11-24 ENCOUNTER — Other Ambulatory Visit: Payer: Self-pay | Admitting: Family Medicine

## 2016-11-24 DIAGNOSIS — R51 Headache: Principal | ICD-10-CM

## 2016-11-24 DIAGNOSIS — R519 Headache, unspecified: Secondary | ICD-10-CM

## 2016-11-24 DIAGNOSIS — G35 Multiple sclerosis: Secondary | ICD-10-CM

## 2016-11-29 ENCOUNTER — Ambulatory Visit: Payer: Self-pay | Admitting: Neurology

## 2016-12-04 ENCOUNTER — Ambulatory Visit: Payer: Self-pay | Admitting: Neurology

## 2016-12-06 ENCOUNTER — Ambulatory Visit
Admission: RE | Admit: 2016-12-06 | Discharge: 2016-12-06 | Disposition: A | Discharge status: Home / Self Care | Payer: BLUE CROSS/BLUE SHIELD | Source: Ambulatory Visit | Attending: Family Medicine | Admitting: Family Medicine

## 2016-12-06 DIAGNOSIS — G35 Multiple sclerosis: Secondary | ICD-10-CM

## 2016-12-06 DIAGNOSIS — R519 Headache, unspecified: Secondary | ICD-10-CM

## 2016-12-06 DIAGNOSIS — R51 Headache: Principal | ICD-10-CM

## 2016-12-06 MED ORDER — GADOBENATE DIMEGLUMINE 529 MG/ML IV SOLN: 10.0000 mL | Freq: Once | INTRAVENOUS | Status: DC | PRN

## 2017-01-05 ENCOUNTER — Ambulatory Visit: Payer: BLUE CROSS/BLUE SHIELD | Admitting: Neurology

## 2017-02-01 ENCOUNTER — Encounter: Payer: Self-pay | Admitting: Occupational Therapy

## 2017-02-01 DIAGNOSIS — M6281 Muscle weakness (generalized): Secondary | ICD-10-CM

## 2017-02-01 NOTE — Therapy (Signed)
[name] [address] [place], [place], [address] Phone: [phone]   Fax:  [phone]  Occupational Therapy Treatment  Patient Details  Name: Amy Rivas MRN: [id number] Date of Birth: Mar 04, 1990 No Data Recorded  Encounter Date: 02/01/2017    Past Medical History:  Diagnosis Date  . Anxiety   . Elevated LFTs   . GERD (gastroesophageal reflux disease)   . Headache   . Hemangioma    as a child; had liver biopsy but unsure of results  . MS (multiple sclerosis) ([place]) 05/2015  . Neuromuscular disorder ([place])    MS  . Ovarian cyst   . 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 PSVT (paroxysmal supraventricular tachycardia) ([place])   . Vitamin D deficiency     Past Surgical History:  Procedure Laterality Date  . CESAREAN SECTION  2010, 2012  . LAPAROSCOPIC CHOLECYSTECTOMY  2008  . TONSILLECTOMY AND ADENOIDECTOMY  1992  . TYMPANOSTOMY [place], 2003    There were no vitals filed for this visit.                          OT Short Term Goals - 06/08/15 1335      OT SHORT TERM GOAL #1   Title  n/a        OT Long Term Goals - 02/01/17 1346      OT LONG TERM GOAL #1   Title  Pt will be mod I with HEP - 07/13/2015 (to allow time for medicaid approval)    Baseline  dependent    Status  Unable to assess      OT LONG TERM GOAL #2   Title  Pt will demonstrate ability to lift 3 pound object off mid level shelf x3 trials.    Baseline  unable to lift items    Status  Unable to assess      OT LONG TERM GOAL #3   Title  Pt will demonstrate at least 10 pound increase in grip strength in R dominant hand to assist with functional tasks.    Baseline  15 pounds    Status  Unable to assess      OT LONG TERM GOAL #4   Title  Pt will be able to carry grocery bag in RUE while walking 30 feet.    Baseline  dependent    Status  Unable to assess            Plan -  02/01/17 1347    Clinical Impression Statement  Pt did not return to therapy after evaluation. Will d/c from [facility]  d/c       Patient will benefit from skilled therapeutic intervention in order to improve the following deficits and impairments:     Visit Diagnosis: Muscle weakness (generalized)    Problem List Patient Active Problem List   Diagnosis Date Noted  . 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 Neck pain 11/07/2016  . Other fatigue 11/07/2016  . Mid back pain 11/07/2016  . Bronchitis 09/05/2016  . 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 High risk medication use 07/05/2016  . Multiple sclerosis ([place]) 05/25/2015  . Acute right-sided weakness 05/25/2015  . Intractable headache 05/25/2015  . Adjustment disorder with anxious mood   . Solitary pulmonary nodule 12/21/2013  . Dyspnea 12/18/2013  . Elevated LFTs 04/15/2013  .  Vomiting 04/14/2013  . Abdominal pain 04/14/2013  . Sinus tachycardia ([place]) 04/14/2013  . Nipple discharge 11/21/2012   OCCUPATIONAL THERAPY DISCHARGE SUMMARY  Visits from Start of Care: 1  Current functional level related to goals / functional outcomes: See above   Remaining deficits: See eval pt did not return to therapy   Education / Equipment: N/a pt did not return Plan: Patient agrees to discharge.  Patient goals were not met. Patient is being discharged due to not returning since the last visit.  ?????      Amy Rivas, Amy Rivas 02/01/2017, 1:47 PM  [facility] [address] [place], [place], [address] Phone: [phone]   Fax:  [phone]  Name: Amy Rivas MRN: [id number] Date of Birth: [birth year]

## 2017-06-04 ENCOUNTER — Other Ambulatory Visit: Payer: Self-pay | Admitting: Family Medicine

## 2017-06-04 ENCOUNTER — Ambulatory Visit
Admission: RE | Admit: 2017-06-04 | Discharge: 2017-06-04 | Disposition: A | Discharge status: Home / Self Care | Payer: BLUE CROSS/BLUE SHIELD | Source: Ambulatory Visit | Attending: Family Medicine | Admitting: Family Medicine

## 2017-06-04 DIAGNOSIS — R1032 Left lower quadrant pain: Secondary | ICD-10-CM

## 2017-06-04 MED ORDER — IOPAMIDOL (ISOVUE-300) INJECTION 61%
100.0000 mL | Freq: Once | INTRAVENOUS | Status: AC | PRN
  Administered 2017-06-04: 100 mL via INTRAVENOUS

## 2017-12-25 ENCOUNTER — Emergency Department (HOSPITAL_COMMUNITY): Payer: BLUE CROSS/BLUE SHIELD

## 2017-12-25 ENCOUNTER — Encounter (HOSPITAL_COMMUNITY): Payer: Self-pay

## 2017-12-25 ENCOUNTER — Other Ambulatory Visit: Payer: Self-pay

## 2017-12-25 ENCOUNTER — Emergency Department (HOSPITAL_COMMUNITY)
Admission: EM | Admit: 2017-12-25 | Discharge: 2017-12-25 | Disposition: A | Discharge status: Home / Self Care | Payer: BLUE CROSS/BLUE SHIELD | Attending: Emergency Medicine | Admitting: Emergency Medicine

## 2017-12-25 DIAGNOSIS — Z79899 Other long term (current) drug therapy: Secondary | ICD-10-CM | POA: Diagnosis not present

## 2017-12-25 DIAGNOSIS — K5900 Constipation, unspecified: Secondary | ICD-10-CM

## 2017-12-25 DIAGNOSIS — R1032 Left lower quadrant pain: Secondary | ICD-10-CM

## 2017-12-25 LAB — URINALYSIS, ROUTINE W REFLEX MICROSCOPIC
BACTERIA UA: NONE SEEN
BILIRUBIN URINE: NEGATIVE
Glucose, UA: NEGATIVE mg/dL
KETONES UR: NEGATIVE mg/dL
LEUKOCYTES UA: NEGATIVE
Nitrite: NEGATIVE
PROTEIN: NEGATIVE mg/dL
SPECIFIC GRAVITY, URINE: 1.01 (ref 1.005–1.030)
pH: 5 (ref 5.0–8.0)

## 2017-12-25 LAB — COMPREHENSIVE METABOLIC PANEL
ALT: 13 U/L (ref 0–44)
AST: 14 U/L — AB (ref 15–41)
Albumin: 4.2 g/dL (ref 3.5–5.0)
Alkaline Phosphatase: 45 U/L (ref 38–126)
Anion gap: 8 (ref 5–15)
BILIRUBIN TOTAL: 1 mg/dL (ref 0.3–1.2)
BUN: 9 mg/dL (ref 6–20)
CHLORIDE: 108 mmol/L (ref 98–111)
CO2: 22 mmol/L (ref 22–32)
CREATININE: 0.76 mg/dL (ref 0.44–1.00)
Calcium: 9.4 mg/dL (ref 8.9–10.3)
GFR calc Af Amer: >60 mL/min (ref >60)
GFR calc non Af Amer: >60 mL/min (ref >60)
Glucose, Bld: 95 mg/dL (ref 70–99)
Potassium: 4.3 mmol/L (ref 3.5–5.1)
Sodium: 138 mmol/L (ref 135–145)
Total Protein: 6.8 g/dL (ref 6.5–8.1)

## 2017-12-25 LAB — CBC WITH DIFFERENTIAL/PLATELET
ABS IMMATURE GRANULOCYTES: 0.08 10*3/uL — AB (ref 0.00–0.07)
Basophils Absolute: 0 10*3/uL (ref 0.0–0.1)
Basophils Relative: 1 %
Eosinophils Absolute: 0.2 10*3/uL (ref 0.0–0.5)
Eosinophils Relative: 3 %
HEMATOCRIT: 45.3 % (ref 36.0–46.0)
Hemoglobin: 15.5 g/dL — ABNORMAL HIGH (ref 12.0–15.0)
IMMATURE GRANULOCYTES: 2 %
LYMPHS ABS: 1 10*3/uL (ref 0.7–4.0)
LYMPHS PCT: 20 %
MCH: 31.1 pg (ref 26.0–34.0)
MCHC: 34.2 g/dL (ref 30.0–36.0)
MCV: 90.8 fL (ref 80.0–100.0)
Monocytes Absolute: 0.6 10*3/uL (ref 0.1–1.0)
Monocytes Relative: 11 %
NEUTROS ABS: 3.3 10*3/uL (ref 1.7–7.7)
Neutrophils Relative %: 63 %
PLATELETS: 280 10*3/uL (ref 150–400)
RBC: 4.99 MIL/uL (ref 3.87–5.11)
RDW: 11.7 % (ref 11.5–15.5)
WBC: 5.1 10*3/uL (ref 4.0–10.5)
nRBC: 0 % (ref 0.0–0.2)

## 2017-12-25 LAB — I-STAT BETA HCG BLOOD, ED (MC, WL, AP ONLY): I-stat hCG, quantitative: <5 m[IU]/mL (ref <5)

## 2017-12-25 MED ORDER — KETOROLAC TROMETHAMINE 15 MG/ML IJ SOLN
15.0000 mg | Freq: Once | INTRAMUSCULAR | Status: AC
  Administered 2017-12-25: 15 mg via INTRAVENOUS
  Filled 2017-12-25: qty 1

## 2017-12-25 MED ORDER — IOHEXOL 300 MG/ML  SOLN
100.0000 mL | Freq: Once | INTRAMUSCULAR | Status: AC | PRN
  Administered 2017-12-25: 100 mL via INTRAVENOUS

## 2017-12-25 MED ORDER — DICYCLOMINE HCL 20 MG PO TABS: 20.0000 mg | ORAL_TABLET | Freq: Two times a day (BID) | ORAL | 0 refills | Status: DC | PRN

## 2017-12-25 MED ORDER — SODIUM CHLORIDE 0.9 % IV BOLUS
500.0000 mL | Freq: Once | INTRAVENOUS | Status: AC
  Administered 2017-12-25: 500 mL via INTRAVENOUS

## 2017-12-25 MED ORDER — POLYETHYLENE GLYCOL 3350 17 G PO PACK: 17.0000 g | PACK | Freq: Every day | ORAL | 0 refills | Status: AC

## 2017-12-25 NOTE — ED Notes (Signed)
Patient transported to CT 

## 2017-12-25 NOTE — Discharge Instructions (Addendum)
10 you taking Tylenol and ibuprofen as needed for pain. You may try Bentyl as needed for spasming or worsening pain. Use heating pads to help with pain as well. Start taking MiraLAX to help encourage bowel movements.  Use 1-2 capfuls in liquid at night to help encourage bowel movements. Make sure you are staying well-hydrated water.  This is very important. Follow-up with gastroenterology for further evaluation if your symptoms not improving. Return to the emergency room with any new, worsening, concerning symptoms.

## 2017-12-25 NOTE — ED Provider Notes (Signed)
[facility] EMERGENCY DEPARTMENT Provider Note   CSN: [id number] Arrival date & time: 12/25/17  7425     History   Chief Complaint Chief Complaint  Patient presents with  . Abdominal Pain    LLQ    HPI Amy Rivas is a 27 y.o. female presenting for evaluation of left lower quadrant abdominal pain.  Patient states pain first began 5 or 6 months ago.  It is constant and mild in her left lower quadrant until 3 days ago when pain became much more severe.  Since then, pain is constant and severe.  Is worse when having a bowel movement.  She denies change with urination or p.o. intake.  Patient states she is tried naproxen and muscle relaxers the past several months, without improvement.  She has not tried anything else.  Patient states she has been straining to have a bowel movement for the past several months, this is abnormal for her.  She denies fevers, chills, chest pain, shortness breath, nausea, vomiting, abdominal pain also, urinary symptoms.  Patient started her period 5 days ago, before the pain worsened.  She has a history of endometriosis, but states this feels different than endometriosis.  Patient also has a history of MS, for which she receives ocrelizumab infusions.  She reports 2 C-sections, no other abdominal surgeries.  She has not seen GI for this.   Patient with significant medical history including MS, ovarian cysts, SVT, GERD, elevated LFTs.  On immunosuppressive medication.  HPI  Past Medical History:  Diagnosis Date  . Anxiety   . Elevated LFTs   . GERD (gastroesophageal reflux disease)   . Headache   . Hemangioma    as a child; had liver biopsy but unsure of results  . MS (multiple sclerosis) ([place]) 05/2015  . Neuromuscular disorder ([place])    MS  . Ovarian cyst   . 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 PSVT (paroxysmal supraventricular tachycardia) ([place])   . Vitamin D deficiency     Patient Active Problem List   Diagnosis Date  Noted  . 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 Neck pain 11/07/2016  . Other fatigue 11/07/2016  . Mid back pain 11/07/2016  . Bronchitis 09/05/2016  . 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 High risk medication use 07/05/2016  . Multiple sclerosis ([place]) 05/25/2015  . Acute right-sided weakness 05/25/2015  . Intractable headache 05/25/2015  . Adjustment disorder with anxious mood   . Solitary pulmonary nodule 12/21/2013  . Dyspnea 12/18/2013  . Elevated LFTs 04/15/2013  . Vomiting 04/14/2013  . Abdominal pain 04/14/2013  . Sinus tachycardia ([place]) 04/14/2013  . Nipple discharge 11/21/2012    Past Surgical History:  Procedure Laterality Date  . CESAREAN SECTION  2010, 2012  . LAPAROSCOPIC CHOLECYSTECTOMY  2008  . TONSILLECTOMY AND ADENOIDECTOMY  1992  . TYMPANOSTOMY [place], 2003     OB History    Gravida  2   Para  2   Term      Preterm  2   AB      Living        SAB      TAB      Ectopic      Multiple      Live Births               Home Medications    Prior to Admission medications  Medication Sig Start Date End Date Taking? Authorizing Provider  dicyclomine (BENTYL) 20 MG tablet Take 1 tablet (20 mg total) by mouth 2 (two) times daily as needed for spasms. 12/25/17   Giuliano Preece, PA-C  ibuprofen (ADVIL,MOTRIN) 200 MG tablet Take 600 mg by mouth every 8 (eight) hours as needed (pain).     [provider]  Indomethacin 20 MG CAPS Take 25 mg 3 (three) times daily with meals by mouth. 11/20/16   Sater, Nanine Means, MD  meloxicam (MOBIC) 15 MG tablet Take 1 tablet (15 mg total) by mouth daily. 11/17/16   Sater, Nanine Means, MD  metoprolol tartrate (LOPRESSOR) 25 MG tablet Take 25 mg by mouth 2 (two) times daily. 05/03/15   [provider]  ocrelizumab 600 mg in sodium chloride 0.9 % 500 mL Inject 600 mg into the vein every 6 (six)  months. Last infusion 1st part of September 2018    [provider]  polyethylene glycol (MIRALAX / [place]) packet Take 17 g by mouth daily. 12/25/17   Verneda Hollopeter, PA-C    Family History Family History  Problem Relation Age of Onset  . Heart disease Mother   . Diabetes Father   . Lung cancer Maternal Grandmother   . Diabetes Maternal Grandmother   . Asthma Maternal Grandmother   . Breast cancer Maternal Grandmother   . Cancer Maternal Uncle        type unknown  . Diabetes Maternal Grandfather   . Diabetes Paternal Grandmother   . Diabetes Paternal Grandfather   . Healthy Sister     Social History Social History   Tobacco Use  . Smoking status: Never Smoker  . Smokeless tobacco: Never Used  Substance Use Topics  . Alcohol use: No    Alcohol/week: 0.0 standard drinks  . Drug use: No     Allergies   Albuterol; Hydrocodone; Codeine; Lortab [hydrocodone-acetaminophen]; Morphine and related; and Tecfidera [dimethyl fumarate]   Review of Systems Review of Systems  Gastrointestinal: Positive for abdominal pain and constipation.  All other systems reviewed and are negative.    Physical Exam Updated Vital Signs BP 115/78 (BP Location: Right Arm)   Pulse 76   Temp 98.8 F (37.1 C) (Oral)   Resp 18   Ht 4\' 8"  (1.422 m)   Wt 47.6 kg   LMP 12/19/2017   SpO2 97%   BMI 23.54 kg/m   Physical Exam  Constitutional: She is oriented to person, place, and time. She appears well-developed and well-nourished. No distress.  Young female who appears uncomfortable due to pain, but nontoxic in appearance  HENT:  Head: Normocephalic and atraumatic.  Eyes: Pupils are equal, round, and reactive to light. Conjunctivae and EOM are normal.  Neck: Normal range of motion. Neck supple.  Cardiovascular: Normal rate, regular rhythm and intact distal pulses.  Pulmonary/Chest: Effort normal and breath sounds normal. No respiratory distress. She has no wheezes.  Abdominal:  Soft. She exhibits no distension and no mass. There is tenderness. There is guarding. There is no rebound.  Focal tenderness palpation of left lower quadrant.  No tenderness palpation elsewhere in the abdomen.  Burning in the left lower quadrant.  No rigidity or distention.  Negative rebound.  Musculoskeletal: Normal range of motion.  Neurological: She is alert and oriented to person, place, and time.  Skin: Skin is warm and dry. Capillary refill takes less than 2 seconds.  Psychiatric: She has a normal mood and affect.  Nursing note and vitals reviewed.  ED Treatments / Results  Labs (all labs ordered are listed, but only abnormal results are displayed) Labs Reviewed  CBC WITH DIFFERENTIAL/PLATELET - Abnormal; Notable for the following components:      Result Value   Hemoglobin 15.5 (*)    Abs Immature Granulocytes 0.08 (*)    All other components within normal limits  COMPREHENSIVE METABOLIC PANEL - Abnormal; Notable for the following components:   AST 14 (*)    All other components within normal limits  URINALYSIS, ROUTINE W REFLEX MICROSCOPIC - Abnormal; Notable for the following components:   Color, Urine STRAW (*)    Hgb urine dipstick SMALL (*)    All other components within normal limits  URINE CULTURE  I-STAT BETA HCG BLOOD, ED ([facility], [facility], [facility] ONLY)    EKG None  Radiology Ct Abdomen Pelvis W Contrast  Result Date: 12/25/2017 CLINICAL DATA:  Left lower quadrant abdominal pain for several months, history of multiple sclerosis EXAM: CT ABDOMEN AND PELVIS WITH CONTRAST TECHNIQUE: Multidetector CT imaging of the abdomen and pelvis was performed using the standard protocol following bolus administration of intravenous contrast. CONTRAST:  117mL OMNIPAQUE IOHEXOL 300 MG/ML  SOLN COMPARISON:  CT abdomen pelvis of 06/04/2016 FINDINGS: Lower chest: The lung bases are clear. The heart is within normal limits. No pericardial effusion is noted. Hepatobiliary: The liver enhances and  there are several rounded low-attenuation structures in both the right and left lobes some of which may represent cysts, but small hemangiomas also are possibilities. The largest is in the lower right lobe posteriorly of 1.0 cm. These low-attenuation structures were present on the prior CT and have not changed. Somewhat nodular character on the prior CT may indicate small hemangiomas. No ductal dilatation is seen. Surgical clips are present from prior cholecystectomy. Pancreas: Pancreas is normal in size and the pancreatic duct is not dilated. Spleen: The spleen is stable and within normal limits in size. Adrenals/Urinary Tract: The kidneys enhance and there is no evidence of renal calculus or mass. No hydronephrosis is seen. The ureters appear to be normal in caliber, and the urinary bladder is well distended with no abnormality noted. Stomach/Bowel: The stomach is moderately distended without obvious abnormality. No small bowel dilatation or edema is seen. The terminal ileum is unremarkable. The appendix is visualized and measures 7 mm in diameter which is within upper limits of normal. No secondary signs of acute appendicitis are seen and clinical correlation is recommended. Some air is noted within the proximal portion of the appendix. No abnormality of the colon is seen. Vascular/Lymphatic: The abdominal aorta is normal in caliber. No adenopathy is seen. 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 No adnexal lesion is seen no free fluid is noted within the pelvis. Other: No abdominal wall hernia is noted. Musculoskeletal: The lumbar vertebrae are in normal alignment with normal intervertebral disc spaces. Both hips are unremarkable. IMPRESSION: 1. No explanation for the patient's pain is seen. No inflammatory process is noted. The appendix and terminal ileum appear normal. 2. Small low-attenuation structures in the liver are  stable and most likely represent complex cysts or possibly hemangiomas. 3. 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 Electronically Signed   By: Ivar Drape M.D.   On: 12/25/2017 12:33    Procedures Procedures (including critical care time)  Medications Ordered in ED Medications  sodium chloride 0.9 % bolus 500  mL (0 mLs Intravenous Stopped 12/25/17 1123)  ketorolac (TORADOL) 15 MG/ML injection 15 mg (15 mg Intravenous Given 12/25/17 1123)  iohexol (OMNIPAQUE) 300 MG/ML solution 100 mL (100 mLs Intravenous Contrast Given 12/25/17 1213)     Initial Impression / Assessment and Plan / ED Course  I have reviewed the triage vital signs and the nursing notes.  Pertinent labs & imaging results that were available during my care of the patient were reviewed by me and considered in my medical decision making (see chart for details).     Patient presenting for evaluation of several months of left lower quadrant abdominal pain which worsened acutely several days ago.  Physical exam shows patient who appears uncomfortable, but in no acute distress.  She is afebrile not tachycardic.  Appears nontoxic.  Focal left lower quadrant abdominal pain without rebound or signs of peritonitis.  As she is on immunosuppressive medicine, consider diverticulitis versus other GI infection.  I did review the CT from 6 months ago which was reassuring, however his pain worsened acutely several days ago, consider new pathology.  Also consider kidney stone, UTI, or GU pathology, lower suspicion as patient has no discharge, urinary symptoms, or bleeding.  Will obtain labs, repeat CT, and reassess.  Patient does not want narcotic pain medication at this time, will give Toradol if kidney function is okay.  Labs reassuring, no leukocytosis.  Hemoglobin stable.  Kidney function reassuring, Toradol ordered.  Urine with small blood, but no signs of  infection.  Patient is currently on her period, likely contributing to the small blood.  CT pending.  CT without obvious source of symptoms or pain.  On reassessment, patient reports pain is much improved with Toradol.  Patient states she has been straining a lot, symptoms could be due to constipation.  Discussed treatment at home with MiraLAX and Bentyl as needed for spasming.  Patient encouraged to follow-up with GI for further evaluation of her symptoms.  At this time, patient appers safe for discharge.  Return precautions given.  Patient states she understands and agrees to plan.   Final Clinical Impressions(s) / ED Diagnoses   Final diagnoses:  LLQ abdominal pain  Constipation, unspecified constipation type    ED Discharge Orders         Ordered    polyethylene glycol (MIRALAX / GLYCOLAX) packet  Daily     12/25/17 1338    dicyclomine (BENTYL) 20 MG tablet  2 times daily PRN     12/25/17 1338           Sherene Plancarte, PA-C 12/25/17 1446    Little, Wenda Overland, MD 12/25/17 1506

## 2017-12-25 NOTE — ED Triage Notes (Signed)
Pt with c/o LLQ abd pain; pt states that the pain has been in abd since May 2019; pt states that she get Ocrevus injections for MS treatment and pain has been worsening since injections. Pt states that pain 7/10.

## 2017-12-26 LAB — URINE CULTURE: CULTURE: NO GROWTH

## 2018-11-11 ENCOUNTER — Encounter (INDEPENDENT_AMBULATORY_CARE_PROVIDER_SITE_OTHER): Payer: Self-pay

## 2020-12-07 IMAGING — CT CT ABD-PELV W/ CM
2 of 4 series · 15 of 46 positions shown, 17 images · IV contrast (Omni 300)
Comparison: CT abdomen pelvis of 06/04/2016

CLINICAL DATA: Left lower quadrant abdominal pain for several
months, history of multiple sclerosis

EXAM:
CT ABDOMEN AND PELVIS WITH CONTRAST
TECHNIQUE: Multidetector CT imaging of the abdomen and pelvis was performed
using the standard protocol following bolus administration of
intravenous contrast.
CONTRAST:  100mL OMNIPAQUE IOHEXOL 300 MG/ML  SOLN

[Series 3: a/p w/ 5mm · axial · 0.63mm/px · z∈[-244,+90]mm · 12 of 78 slices shown, 14 images]
[im 7/78  soft-tissue]
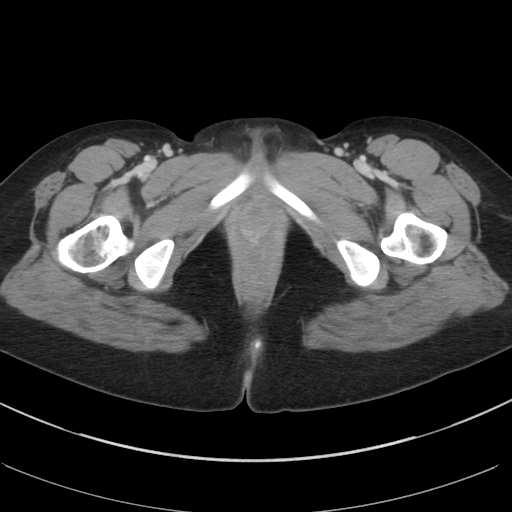
[im 7/78  bone]
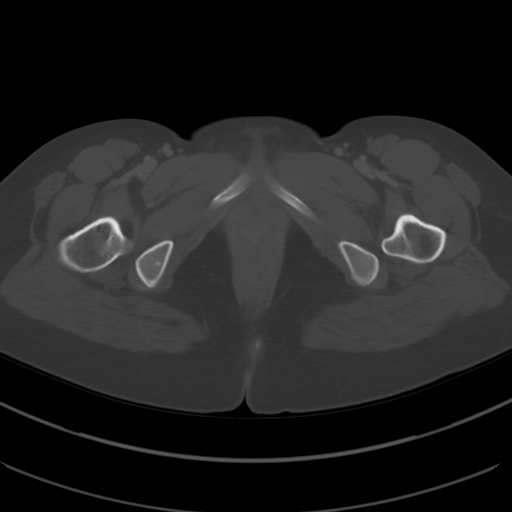
[im 13/78  soft-tissue]
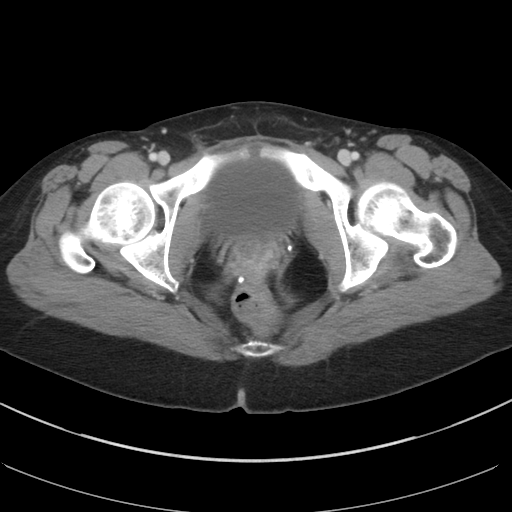
[im 19/78  soft-tissue]
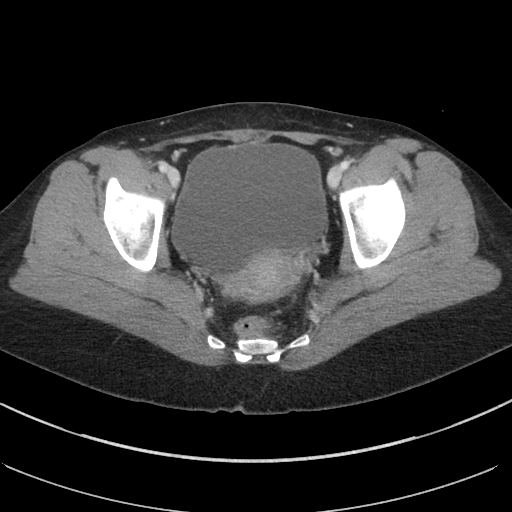
[im 25/78  soft-tissue]
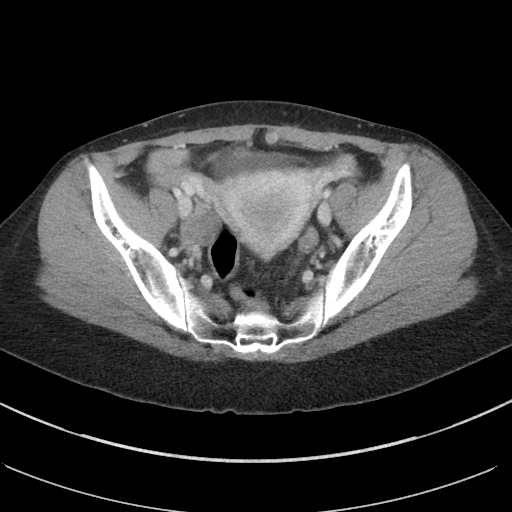
[im 31/78  soft-tissue]
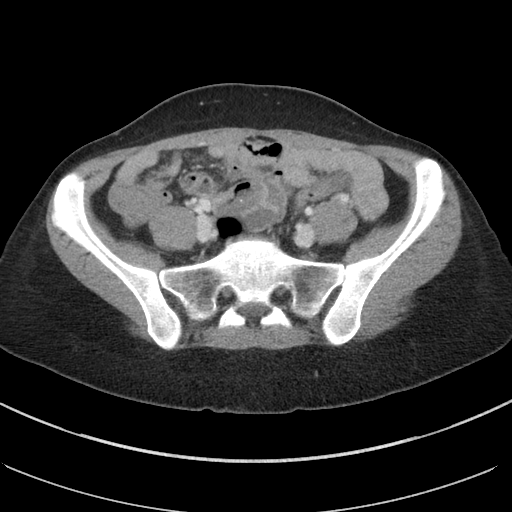
[im 37/78  soft-tissue]
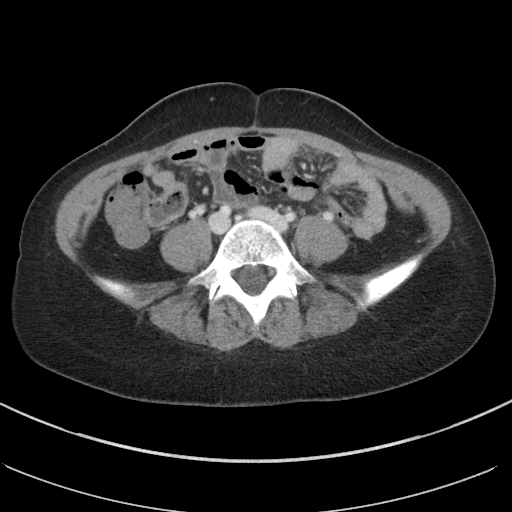
[im 44/78  soft-tissue]
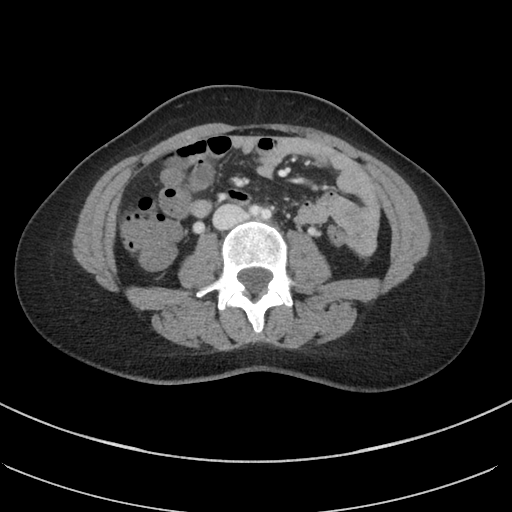
[im 50/78  soft-tissue]
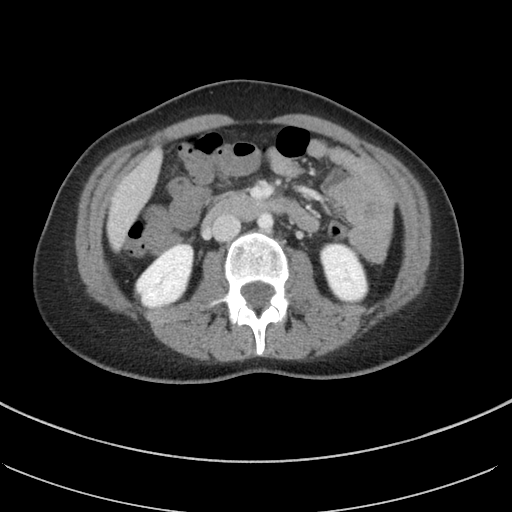
[im 56/78  soft-tissue]
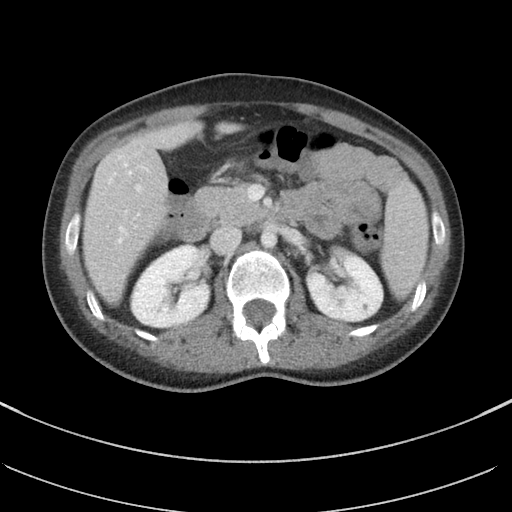
[im 56/78  bone]
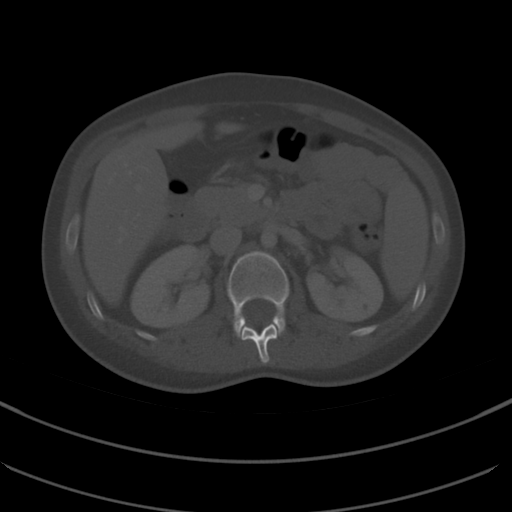
[im 62/78  soft-tissue]
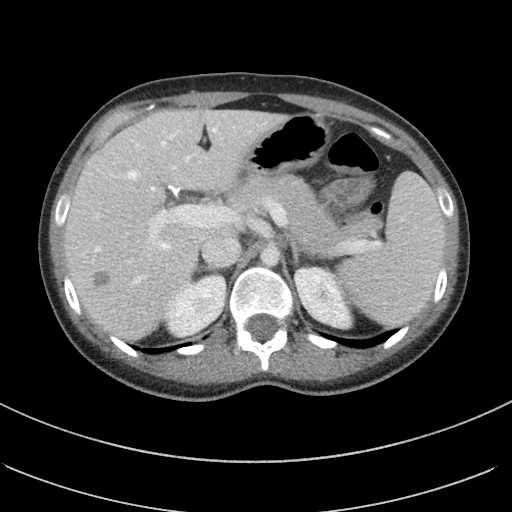
[im 68/78  soft-tissue]
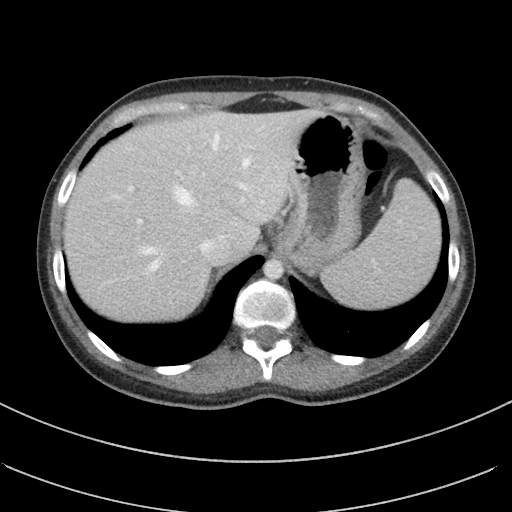
[im 74/78  soft-tissue]
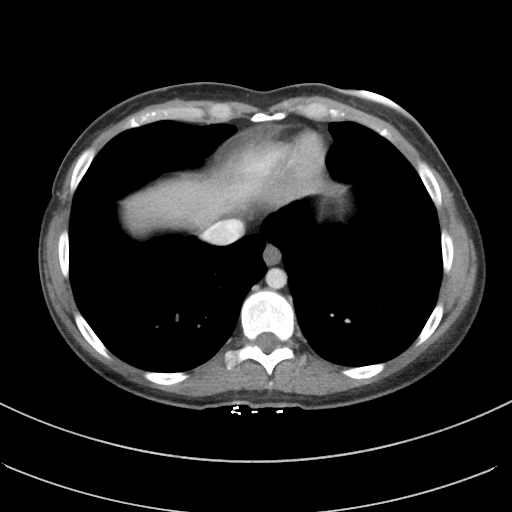

[Series 6: a/p w/ cor · coronal · 0.60mm/px · 3 of 103 slices shown]
[im 35/103  soft-tissue]
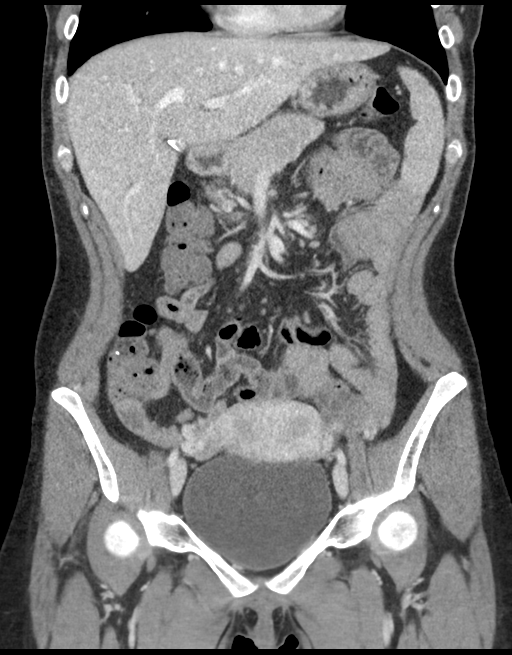
[im 46/103  soft-tissue]
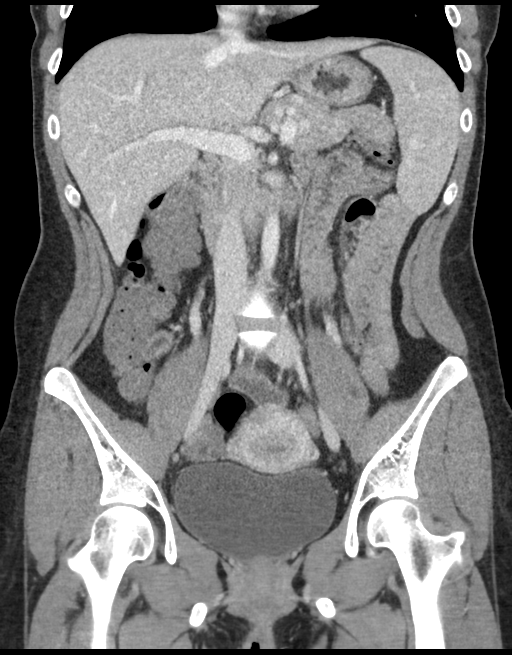
[im 57/103  soft-tissue]
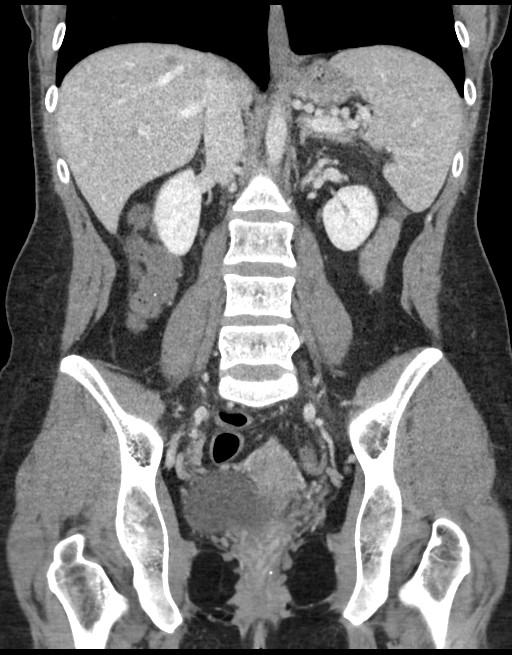

[15 of 46 positions shown; findings below may reference images not displayed]

FINDINGS: Lower chest: The lung bases are clear. The heart is within normal
limits. No pericardial effusion is noted.

Hepatobiliary: The liver enhances and there are several rounded
low-attenuation structures in both the right and left lobes some of
which may represent cysts, but small hemangiomas also are
possibilities. The largest is in the lower right lobe posteriorly of
1.0 cm. These low-attenuation structures were present on the prior
CT and have not changed. Somewhat nodular character on the prior CT
may indicate small hemangiomas. No ductal dilatation is seen.
Surgical clips are present from prior cholecystectomy.

Pancreas: Pancreas is normal in size and the pancreatic duct is not
dilated.

Spleen: The spleen is stable and within normal limits in size.

Adrenals/Urinary Tract: The kidneys enhance and there is no evidence
of renal calculus or mass. No hydronephrosis is seen. The ureters
appear to be normal in caliber, and the urinary bladder is well
distended with no abnormality noted.

Stomach/Bowel: The stomach is moderately distended without obvious
abnormality. No small bowel dilatation or edema is seen. The
terminal ileum is unremarkable. The appendix is visualized and
measures 7 mm in diameter which is within upper limits of normal. No
secondary signs of acute appendicitis are seen and clinical
correlation is recommended. Some air is noted within the proximal
portion of the appendix. No abnormality of the colon is seen.

Vascular/Lymphatic: The abdominal aorta is normal in caliber. No
adenopathy is seen.

Reproductive: The developmental abnormality of the uterus is again
noted consistent with septate uterus or bicornuate uterus. No
adnexal lesion is seen no free fluid is noted within the pelvis.

Other: No abdominal wall hernia is noted.

Musculoskeletal: The lumbar vertebrae are in normal alignment with
normal intervertebral disc spaces. Both hips are unremarkable.
IMPRESSION: 1. No explanation for the patient's pain is seen. No inflammatory
process is noted. The appendix and terminal ileum appear normal.
2. Small low-attenuation structures in the liver are stable and most
likely represent complex cysts or possibly hemangiomas.
3. No change in developmental abnormality of the uterus representing
either a septate or bicornuate uterus as described previously.

## 2022-06-09 ENCOUNTER — Emergency Department (HOSPITAL_COMMUNITY): Payer: BC Managed Care – PPO

## 2022-06-09 ENCOUNTER — Emergency Department (HOSPITAL_COMMUNITY)
Admission: EM | Admit: 2022-06-09 | Discharge: 2022-06-09 | Disposition: A | Discharge status: Home / Self Care | Payer: BC Managed Care – PPO | Attending: Emergency Medicine | Admitting: Emergency Medicine

## 2022-06-09 DIAGNOSIS — R109 Unspecified abdominal pain: Secondary | ICD-10-CM

## 2022-06-09 DIAGNOSIS — D72829 Elevated white blood cell count, unspecified: Secondary | ICD-10-CM | POA: Insufficient documentation

## 2022-06-09 DIAGNOSIS — R1031 Right lower quadrant pain: Secondary | ICD-10-CM | POA: Insufficient documentation

## 2022-06-09 DIAGNOSIS — R1011 Right upper quadrant pain: Secondary | ICD-10-CM | POA: Diagnosis not present

## 2022-06-09 DIAGNOSIS — K529 Noninfective gastroenteritis and colitis, unspecified: Secondary | ICD-10-CM

## 2022-06-09 LAB — URINALYSIS, ROUTINE W REFLEX MICROSCOPIC
Bilirubin Urine: NEGATIVE
Glucose, UA: NEGATIVE mg/dL
Hgb urine dipstick: NEGATIVE
Ketones, ur: NEGATIVE mg/dL
Leukocytes,Ua: NEGATIVE
Nitrite: NEGATIVE
Protein, ur: NEGATIVE mg/dL
Specific Gravity, Urine: 1.021 (ref 1.005–1.030)
pH: 5 (ref 5.0–8.0)

## 2022-06-09 LAB — COMPREHENSIVE METABOLIC PANEL
ALT: 11 U/L (ref 0–44)
AST: 12 U/L — ABNORMAL LOW (ref 15–41)
Albumin: 4.2 g/dL (ref 3.5–5.0)
Alkaline Phosphatase: 45 U/L (ref 38–126)
Anion gap: 8 (ref 5–15)
BUN: 15 mg/dL (ref 6–20)
CO2: 21 mmol/L — ABNORMAL LOW (ref 22–32)
Calcium: 8.8 mg/dL — ABNORMAL LOW (ref 8.9–10.3)
Chloride: 107 mmol/L (ref 98–111)
Creatinine, Ser: 0.86 mg/dL (ref 0.44–1.00)
GFR, Estimated: >60 mL/min (ref >60)
Glucose, Bld: 90 mg/dL (ref 70–99)
Potassium: 3.8 mmol/L (ref 3.5–5.1)
Sodium: 136 mmol/L (ref 135–145)
Total Bilirubin: 0.8 mg/dL (ref 0.3–1.2)
Total Protein: 6.8 g/dL (ref 6.5–8.1)

## 2022-06-09 LAB — CBC
HCT: 42.2 % (ref 36.0–46.0)
Hemoglobin: 15.5 g/dL — ABNORMAL HIGH (ref 12.0–15.0)
MCH: 33 pg (ref 26.0–34.0)
MCHC: 36.7 g/dL — ABNORMAL HIGH (ref 30.0–36.0)
MCV: 90 fL (ref 80.0–100.0)
Platelets: 319 10*3/uL (ref 150–400)
RBC: 4.69 MIL/uL (ref 3.87–5.11)
RDW: 11.3 % — ABNORMAL LOW (ref 11.5–15.5)
WBC: 10.8 10*3/uL — ABNORMAL HIGH (ref 4.0–10.5)
nRBC: 0 % (ref 0.0–0.2)

## 2022-06-09 LAB — LIPASE, BLOOD: Lipase: 26 U/L (ref 11–51)

## 2022-06-09 LAB — LACTIC ACID, PLASMA: Lactic Acid, Venous: 0.9 mmol/L (ref 0.5–1.9)

## 2022-06-09 MED ORDER — DICYCLOMINE HCL 20 MG PO TABS: 20.0000 mg | ORAL_TABLET | Freq: Two times a day (BID) | ORAL | 0 refills | Status: AC

## 2022-06-09 MED ORDER — FENTANYL CITRATE PF 50 MCG/ML IJ SOSY
100.0000 ug | PREFILLED_SYRINGE | Freq: Once | INTRAMUSCULAR | Status: AC
  Administered 2022-06-09: 100 ug via INTRAVENOUS
  Filled 2022-06-09: qty 2

## 2022-06-09 MED ORDER — FENTANYL CITRATE PF 50 MCG/ML IJ SOSY
50.0000 ug | PREFILLED_SYRINGE | Freq: Once | INTRAMUSCULAR | Status: AC
  Administered 2022-06-09: 50 ug via INTRAVENOUS
  Filled 2022-06-09: qty 1

## 2022-06-09 MED ORDER — SODIUM CHLORIDE 0.9 % IV SOLN
12.5000 mg | Freq: Once | INTRAVENOUS | Status: AC
  Administered 2022-06-09: 12.5 mg via INTRAVENOUS
  Filled 2022-06-09: qty 12.5

## 2022-06-09 MED ORDER — IOHEXOL 300 MG/ML  SOLN
100.0000 mL | Freq: Once | INTRAMUSCULAR | Status: AC | PRN
  Administered 2022-06-09: 100 mL via INTRAVENOUS

## 2022-06-09 MED ORDER — ONDANSETRON HCL 4 MG PO TABS: 4.0000 mg | ORAL_TABLET | Freq: Four times a day (QID) | ORAL | 0 refills | Status: DC | PRN

## 2022-06-09 NOTE — ED Provider Notes (Signed)
[facility] EMERGENCY DEPARTMENT AT [facility] Provider Note   CSN: [id number] Arrival date & time: 06/09/22  1249     History  Chief Complaint  Patient presents with   Abdominal Pain    Amy Rivas is a 32 y.o. female with history of GERD, anxiety, multiple sclerosis, endometriosis who presents the emergency department complaining of severe abdominal pain.  Patient states that she started experiencing pain in her right lower quadrant that woke her up around 2 AM this morning.  Associated nausea, vomiting, fever.  Initially went to urgent care and was sent to the ER for further evaluation.  Noted to be tachycardic, given 50 mcg of fentanyl and 4 mg of Zofran per EMS. Hx of multiple C-sections, hysterectomy, and endometriosis removal.    Abdominal Pain Associated symptoms: fever, nausea and vomiting        Home Medications Prior to Admission medications   Medication Sig Start Date End Date Taking? Authorizing Provider  dicyclomine (BENTYL) 20 MG tablet Take 1 tablet (20 mg total) by mouth 2 (two) times daily as needed for spasms. 12/25/17   Caccavale, Sophia, PA-C  ibuprofen (ADVIL,MOTRIN) 200 MG tablet Take 600 mg by mouth every 8 (eight) hours as needed (pain).     [provider]  Indomethacin 20 MG CAPS Take 25 mg 3 (three) times daily with meals by mouth. 11/20/16   Sater, Pearletha Furl, MD  meloxicam (MOBIC) 15 MG tablet Take 1 tablet (15 mg total) by mouth daily. 11/17/16   Sater, Pearletha Furl, MD  metoprolol tartrate (LOPRESSOR) 25 MG tablet Take 25 mg by mouth 2 (two) times daily. 05/03/15   [provider]  ocrelizumab 600 mg in sodium chloride 0.9 % 500 mL Inject 600 mg into the vein every 6 (six) months. Last infusion 1st part of September 2018    [provider]  polyethylene glycol (MIRALAX / GLYCOLAX) packet Take 17 g by mouth daily. 12/25/17   Caccavale, Sophia, PA-C      Allergies    Albuterol, Hydrocodone, Codeine, Lortab  [hydrocodone-acetaminophen], Morphine and codeine, and Tecfidera [dimethyl fumarate]    Review of Systems   Review of Systems  Constitutional:  Positive for fever.  Gastrointestinal:  Positive for abdominal pain, nausea and vomiting.  All other systems reviewed and are negative.   Physical Exam Updated Vital Signs BP (!) 129/93 (BP Location: Right Arm)   Pulse 98   Temp 97.8 F (36.6 C) (Oral)   Resp 17   LMP 12/19/2017   SpO2 97%  Physical Exam Vitals and nursing note reviewed.  Constitutional:      Appearance: Normal appearance.  HENT:     Head: Normocephalic and atraumatic.  Eyes:     Conjunctiva/sclera: Conjunctivae normal.  Cardiovascular:     Rate and Rhythm: Normal rate and regular rhythm.  Pulmonary:     Effort: Pulmonary effort is normal. No respiratory distress.     Breath sounds: Normal breath sounds.  Abdominal:     General: There is no distension.     Palpations: Abdomen is soft.     Tenderness: There is abdominal tenderness in the right upper quadrant and right lower quadrant. There is guarding.  Skin:    General: Skin is warm and dry.  Neurological:     General: No focal deficit present.     Mental Status: She is alert.     ED Results / Procedures / Treatments   Labs (all labs ordered are listed, but  only abnormal results are displayed) Labs Reviewed  COMPREHENSIVE METABOLIC PANEL - Abnormal; Notable for the following components:      Result Value   CO2 21 (*)    Calcium 8.8 (*)    AST 12 (*)    All other components within normal limits  CBC - Abnormal; Notable for the following components:   WBC 10.8 (*)    Hemoglobin 15.5 (*)    MCHC 36.7 (*)    RDW 11.3 (*)    All other components within normal limits  URINALYSIS, ROUTINE W REFLEX MICROSCOPIC - Abnormal; Notable for the following components:   APPearance HAZY (*)    All other components within normal limits  LIPASE, BLOOD  LACTIC ACID, PLASMA    EKG None  Radiology No results  found.  Procedures Procedures    Medications Ordered in ED Medications  fentaNYL (SUBLIMAZE) injection 50 mcg (50 mcg Intravenous Given 06/09/22 1327)  promethazine (PHENERGAN) 12.5 mg in sodium chloride 0.9 % 50 mL IVPB (0 mg Intravenous Stopped 06/09/22 1340)  iohexol (OMNIPAQUE) 300 MG/ML solution 100 mL (100 mLs Intravenous Contrast Given 06/09/22 1414)    ED Course/ Medical Decision Making/ A&P Clinical Course as of 06/09/22 1446  Fri Jun 09, 2022  1443 Sudden onset right lower quadrant pain with radiation to the right upper quadrant, began this morning doing.  She was febrile, tachycardic with urgent care.  She has received 2 rounds of 50 mcg of fentanyl.  She is pending CT abdomen pelvis with contrast.  All work is overall unremarkable other than mild leukocytosis.  She still has her appendix.  She has MS, otherwise overall noncontributory past medical history. NVD.  [CP]    Clinical Course User Index [CP] Olene Floss, PA-C                             Medical Decision Making Amount and/or Complexity of Data Reviewed Labs: ordered. Radiology: ordered.  Risk Prescription drug management.   This patient is a 32 y.o. female  who presents to the ED for concern of RLQ abdominal pain, nausea, vomiting.   Differential diagnoses prior to evaluation: The emergent differential diagnosis includes, but is not limited to,  AAA, mesenteric ischemia, appendicitis, diverticulitis, DKA, gastroenteritis, nephrolithiasis, pancreatitis, constipation, UTI, bowel obstruction, biliary disease, IBD, PUD, hepatitis, PID. This is not an exhaustive differential.   Past Medical History / Co-morbidities / Social History: GERD, anxiety, multiple sclerosis, endometriosis Hx of multiple C-sections, total hysterectomy, and endometriosis removal  Physical Exam: Physical exam performed. The pertinent findings include: Tachycardic, afebrile.  Abdomen soft, with right upper and lower quadrant  tenderness to palpation, with guarding.  No rebound tenderness.  Lab Tests/Imaging studies: I personally interpreted labs/imaging and the pertinent results include: Leukocytosis of 10.8, hemoglobin mildly elevated.  CMP grossly unremarkable.  Normal lipase.  Normal lactic acid.  Urinalysis without hematuria or infection.  CT abdomen pelvis pending at time of shift change.  Per my initial interpretation of the image, could be some evidence of duodenitis.  Medications: I ordered medication including fentanyl, and Phenergan.  I have reviewed the patients home medicines and have made adjustments as needed.   Disposition: Patient discussed and care transferred to [facility] PA-C at shift change. Please see his/her note for further details regarding further ED course and disposition. Plan at time of handoff is follow-up on CT imaging to determine disposition.  Final Clinical Impression(s) / ED Diagnoses Final  diagnoses:  Right sided abdominal pain    Rx / DC Orders ED Discharge Orders     None      Portions of this report may have been transcribed using voice recognition software. Every effort was made to ensure accuracy; however, inadvertent computerized transcription errors may be present.    Jeanella Flattery 06/09/22 1446    Mardene Sayer, MD 06/09/22 1724

## 2022-06-09 NOTE — ED Provider Notes (Signed)
Accepted handoff at shift change from [employer]. Please see prior provider note for more detail.   Briefly: Patient is 32 y.o.   DDX: concern for Sudden onset right lower quadrant pain with radiation to the right upper quadrant, began this morning doing.  She was febrile, tachycardic with urgent care.  She has received 2 rounds of 50 mcg of fentanyl.  She is pending CT abdomen pelvis with contrast.  All work is overall unremarkable other than mild leukocytosis.  She still has her appendix.  She has MS, otherwise overall noncontributory past medical history. NVD.   Plan: I independently interpreted imaging including CT abdomen pelvis with contrast which shows no evidence of acute intra-abdominal abnormality. I agree with the radiologist interpretation.  On reevaluation patient is able to tolerate p.o., she is still having some abdominal discomfort but reports she is not having as severe pain anymore.  Discussed that given her otherwise reassuring lab work and unremarkable CT I suspect that she may have a viral gastroenteritis versus IBD or other nonemergent colitis/enteritis.  No significant evidence of a new bacterial infection with very mild leukocytosis only.  Discussed with patient and recommend nausea medication, fluids, bland diet, will discharge with Bentyl, encouraged GI follow-up as needed.  Patient understands and agrees to plan, and is discharged in stable condition at this time.      RISR  EDTHIS    Olene Floss, PA-C 06/09/22 1500    Lorre Nick, MD 06/11/22 [phone]

## 2022-06-09 NOTE — Discharge Instructions (Addendum)
I recommend nausea medication as needed, bland diet, plenty open he can use the Bentyl medication for crampy abdominal pain.  Recommend that you follow-up with your primary care doctor, may follow-up with a gastroenterologist if you continue to have abdominal pain despite treatment.  Please use Tylenol for fever.

## 2022-06-09 NOTE — ED Notes (Signed)
Spoke to PA about patient still in quite a bit of pain    He stated he would provide something before she was discharged

## 2022-06-09 NOTE — ED Triage Notes (Signed)
BIB EMS from Urgent Care, Acute RLQ pain, n/v, fever, tachycardic, of Fentanyl 4mg  Zofran per EMS

## 2023-11-13 NOTE — Progress Notes (Signed)
 [facility] Virtual On Demand Patient Information  Location Information: Patient State (at time of visit): [place]  Patient Location (at time of visit):Home/Other Non-Medical  Provider Location: Home Is provider licensed to provide clinical care in the current location/state of the patient? Yes  Consent  Patient's identity was confirmed. Presenting condition or illness was discussed with the patient/personal representative. Current proposed treatment for presenting condition or illness was explained to patient/personal representative along with the likely benefits and any significant risks or complications associated with the provision of treatment by audio/video means. The patient/personal representative verbally authorized treatment to be provided by audio/video, which may include a limited review of patient's current health status, medication, or other treatment recommendations, patient education, and an opportunity to ask questions about condition and treatment. Verbal Consent Granted by Patient/Personal Representative:Yes   Visit Information: Modality: 2-Way Real-Time Audio/Video  Video Visit Start Time/ End Time:  Start time: 11/13/2023  8:39 PM EDT End time: 11/13/2023  8:42 PM EDT  Video Total Time: 3 minutes  History of Present Illness  Amy Rivas presents due to concerns for UTI symptoms.  Per patient started yesterday.  She reports increased frequency urgency and burning when she urinates.  Does report low bit of pressure to her lower abdomen but denies any pain.  No fevers no nausea no vomiting.  No vaginal discharge concern for infections.  No concerns of being pregnant not breast-feeding.  Video Exam  Physical Exam Constitutional:      Appearance: Normal appearance.  HENT:     Right Ear: External ear normal.     Left Ear: External ear normal.     Nose: Nose normal. No rhinorrhea.     Mouth/Throat:     Pharynx: No posterior oropharyngeal erythema.   Eyes:     General:        Right eye: No discharge.        Left eye: No discharge.     Extraocular Movements: Extraocular movements intact.     Conjunctiva/sclera: Conjunctivae normal.  Pulmonary:     Effort: Pulmonary effort is normal. No respiratory distress.     Breath sounds: No stridor.     Comments: Able to speak in fluid sentences with no gasping.  No audible wheeze Abdominal:     Comments: No pain when patient self palpates.  Neurological:     General: No focal deficit present.     Mental Status: She is alert.  Psychiatric:        Mood and Affect: Mood normal.        Behavior: Behavior normal.        Thought Content: Thought content normal.     Diagnosis, Medical Decision Making & Disposition  Assessment/ Plan 1. UTI symptoms (Primary) - nitrofurantoin, macrocrystal-monohydrate, (MACROBID) 100 mg capsule; Take 1 capsule (100 mg total) by mouth 2 (two) times a day for 10 days.  Dispense: 20 capsule; Refill: 0 - phenazopyridine (Pyridium) 200 mg tablet; Take 1 tablet (200 mg total) by mouth in the morning and 1 tablet (200 mg total) at noon and 1 tablet (200 mg total) in the evening. Take with meals. Do all this for 2 days.  Dispense: 6 tablet; Refill: 0   Discussed with patient that symptoms are consistent with UTI. She is to begin nitrofurantoin. Other suggestions for care at home:    - Increase your fluid intake- drink plenty of water.   - Urinate more often.  Don't try to hold urine in for a  long time.   - If you were given Pyridium (phenazopyridine) to reduce frequency, pain or burning with urination, it will cause your urine to become orange.  This can stain clothing.   - You can use Tylenol  or Advil  as needed for pain or discomfort.   - Avoid intercourse until your symptoms are gone.   Monitor your symptoms, which we expect will improve in the next 24-48 hours.   Please seek prompt attention for worsening symptoms: fever 100.4 or higher, back or belly pain,  repeated vomiting, vaginal discharge or any pain, no improvement in 48 hours or worsening over 24 hours of antibiotic treatment, redness or swelling of the outer vaginal area (labia).   For questions or concerns regarding this visit, patient should contact Virtual Support at [phone].  Disposition:  Patient to continue care at home.  Electronically signed: Debby Pop, PA 11/13/2023  8:42 PM

## 2023-11-16 ENCOUNTER — Inpatient Hospital Stay (HOSPITAL_COMMUNITY)
Admission: EM | Admit: 2023-11-16 | Discharge: 2023-11-17 | DRG: 690 | Disposition: A | Discharge status: Home / Self Care | Attending: Hospitalist | Admitting: Hospitalist

## 2023-11-16 ENCOUNTER — Encounter (HOSPITAL_COMMUNITY): Payer: Self-pay | Admitting: *Deleted

## 2023-11-16 ENCOUNTER — Other Ambulatory Visit: Payer: Self-pay

## 2023-11-16 DIAGNOSIS — Z9049 Acquired absence of other specified parts of digestive tract: Secondary | ICD-10-CM

## 2023-11-16 DIAGNOSIS — N3000 Acute cystitis without hematuria: Principal | ICD-10-CM | POA: Diagnosis present

## 2023-11-16 DIAGNOSIS — Z791 Long term (current) use of non-steroidal anti-inflammatories (NSAID): Secondary | ICD-10-CM

## 2023-11-16 DIAGNOSIS — N201 Calculus of ureter: Principal | ICD-10-CM

## 2023-11-16 DIAGNOSIS — K219 Gastro-esophageal reflux disease without esophagitis: Secondary | ICD-10-CM | POA: Diagnosis present

## 2023-11-16 DIAGNOSIS — Z888 Allergy status to other drugs, medicaments and biological substances status: Secondary | ICD-10-CM

## 2023-11-16 DIAGNOSIS — N202 Calculus of kidney with calculus of ureter: Secondary | ICD-10-CM | POA: Diagnosis present

## 2023-11-16 DIAGNOSIS — Z8249 Family history of ischemic heart disease and other diseases of the circulatory system: Secondary | ICD-10-CM

## 2023-11-16 DIAGNOSIS — Z79899 Other long term (current) drug therapy: Secondary | ICD-10-CM

## 2023-11-16 DIAGNOSIS — N136 Pyonephrosis: Secondary | ICD-10-CM | POA: Diagnosis not present

## 2023-11-16 DIAGNOSIS — F419 Anxiety disorder, unspecified: Secondary | ICD-10-CM | POA: Diagnosis present

## 2023-11-16 DIAGNOSIS — N12 Tubulo-interstitial nephritis, not specified as acute or chronic: Secondary | ICD-10-CM

## 2023-11-16 DIAGNOSIS — Z833 Family history of diabetes mellitus: Secondary | ICD-10-CM

## 2023-11-16 DIAGNOSIS — G35D Multiple sclerosis, unspecified: Secondary | ICD-10-CM | POA: Diagnosis present

## 2023-11-16 DIAGNOSIS — Z885 Allergy status to narcotic agent status: Secondary | ICD-10-CM

## 2023-11-16 LAB — CBC
HCT: 44.9 % (ref 36.0–46.0)
Hemoglobin: 15.7 g/dL — ABNORMAL HIGH (ref 12.0–15.0)
MCH: 32 pg (ref 26.0–34.0)
MCHC: 35 g/dL (ref 30.0–36.0)
MCV: 91.4 fL (ref 80.0–100.0)
Platelets: 383 K/uL (ref 150–400)
RBC: 4.91 MIL/uL (ref 3.87–5.11)
RDW: 11.3 % — ABNORMAL LOW (ref 11.5–15.5)
WBC: 10.4 K/uL (ref 4.0–10.5)
nRBC: 0 % (ref 0.0–0.2)

## 2023-11-16 LAB — COMPREHENSIVE METABOLIC PANEL WITH GFR
ALT: 25 U/L (ref 0–44)
AST: 18 U/L (ref 15–41)
Albumin: 4.3 g/dL (ref 3.5–5.0)
Alkaline Phosphatase: 62 U/L (ref 38–126)
Anion gap: 10 (ref 5–15)
BUN: 17 mg/dL (ref 6–20)
CO2: 25 mmol/L (ref 22–32)
Calcium: 9.2 mg/dL (ref 8.9–10.3)
Chloride: 102 mmol/L (ref 98–111)
Creatinine, Ser: 0.97 mg/dL (ref 0.44–1.00)
GFR, Estimated: >60 mL/min (ref >60)
Glucose, Bld: 88 mg/dL (ref 70–99)
Potassium: 4.1 mmol/L (ref 3.5–5.1)
Sodium: 137 mmol/L (ref 135–145)
Total Bilirubin: 0.8 mg/dL (ref 0.0–1.2)
Total Protein: 6.6 g/dL (ref 6.5–8.1)

## 2023-11-16 LAB — LIPASE, BLOOD: Lipase: 29 U/L (ref 11–51)

## 2023-11-16 MED ORDER — KETOROLAC TROMETHAMINE 15 MG/ML IJ SOLN
15.0000 mg | Freq: Once | INTRAMUSCULAR | Status: DC
  Filled 2023-11-16: qty 1

## 2023-11-16 NOTE — Progress Notes (Signed)
 [address] - AMBULATORY [facility]  - [facility] [address] [place] [address]    Date of service:  11/16/2023  Name:  [facility]  Date of Birth:  [birth year]   SUBJECTIVE   Patient ID: Amy Rivas is a 33 y.o. (DOB 09-08-1990) female  Chief Complaint  Patient presents with  . Urinary Tract Infection    The patient was last seen 09/27/23.  Pt had a telehealth visit on 11/13/23 for urinary frequency, urgency, burning, mild suprapubic pressure. Prescribed Macrobid 100 mg and Pyridium 200 mg. Pyridium completed and has 6 days of Macrobid left. She was advised that if no improvement in 2 days then to see PCP.  Urinary Tract Infection:  Patient complains of right flank pressure, feels as if bladder not emptying all the way and hesitancy  She has had symptoms for 5 days.  Patient also complains of none.  Patient denies back pain, congestion, cough, fever, headache, rhinitis, sorethroat, stomach ache, and vaginal discharge.  Patient does not have a history of recurrent UTI.  Patient does not have a history of pyelonephritis.    Review of Systems  All other pertinent systems reviewed and are negative.  Social History[1]  The following portions of the patient's history were reviewed and updated as appropriate: allergies, current medications, past family history, past medical history, past social history, past surgical history and problem list.  OBJECTIVE   Vitals:   11/16/23 1032  BP: 120/70  BP Location: Left arm  Patient Position: Sitting  Pulse: 67  Temp: 97.4 F (36.3 C)  TempSrc: Temporal  SpO2: 98%  Weight: 66.3 kg (146 lb 2 oz)  Height: 1.448 m (4' 9)    Body mass index is 31.62 kg/m.   Constitutional: Alert - NAD. Appears well-developed and well nourished. Abdomen soft with mild suprapubic tenderness to palpation No flank tenderness to palpation ASSESSMENT/PLAN    Problem List Items Addressed This Visit   None Visit Diagnoses       Lower urinary tract symptoms    -  Primary   Relevant Orders   POC Urinalysis Auto without Microscopic (Completed)     Acute cystitis with hematuria       Relevant Orders   Urine Culture     Patient does have a history of MS neurology following, she has no history of bladder complications from MS. Experiencing symptoms of incomplete emptying with positive POC UA Currently on Macrobid but worsening despite treatment over the last 4 days, DC Macrobid and culture ordered.  I probably will I have preliminary for another 24 hours so we will go ahead and prescribe Bactrim DS which she thinks she may have taken before and tolerated.  Will be in touch pending culture report if we need to change that Risks, benefits, and alternatives of the medication(s) and treatment plan(s) were discussed, and she expressed understanding. No barriers to treatment identified in this visit.   Return if symptoms worsen or fail to improve.    Current Outpatient Medications  Medication Instructions  . baclofen (LIORESAL) 10 mg tablet TAKE 1 TO 2 TABLETS(10 TO 20 MG) BY MOUTH THREE TIMES DAILY AS NEEDED FOR MUSCLE SPASM  . cholecalciferol (VITAMIN D3) 5,000 Units, Daily  . EPINEPHrine  (EPIPEN ) 0.3 mg/0.3 mL injection syringe   . gabapentin (NEURONTIN) 100 mg, oral, 3 times daily PRN  . hyoscyamine sulfate (NULEV) 0.125 mg, oral, Every 6 hours PRN  .  loratadine (CLARITIN) 10 mg, Daily  . metoprolol  tartrate (LOPRESSOR ) 50 mg, oral, 2 times daily  . multivit-min-iron fum-folic ac (One-A-Day Women's Complete) 18 mg iron- 400 mcg tab 1 tablet, Daily  . naproxen sodium (ANAPROX) 220 mg, As needed  . nitrofurantoin, macrocrystal-monohydrate, (MACROBID) 100 mg capsule 100 mg, oral, 2 times daily  . Ocrevus 600 mg, Every 6 months  . omega-3 fatty acids-fish oil (Fish OiL Extra Strength) 435-880 mg cap   . SUMAtriptan (IMITREX) 50 mg, oral, Daily PRN     This document serves as a record of services personally performed by Madelin Brought, MD.  It was created on their behalf by Rolin LOISE Ka, CMA, a trained medical scribe, and Certified Medical Assistant (CMA). During the course of documenting the history, physical exam and medical decision making, I was functioning as a stage manager. The creation of this record is the provider's dictation and/or activities during the visit.  Electronically signed by Rolin LOISE Ka, CMA 11/16/2023 9:05 AM    I agree the documentation is accurate and complete.  Electronically signed by: Madelin Rachel Brought, MD 11/16/2023 11:06 AM          [1] Social History Tobacco Use  . Smoking status: Never    Passive exposure: Never  . Smokeless tobacco: Never  Vaping Use  . Vaping status: Never Used  . Passive vaping exposure: Yes  Substance Use Topics  . Alcohol use: Yes  . Drug use: Never

## 2023-11-16 NOTE — ED Notes (Signed)
 Attempted to perform bladder scan, scan ready 0 after attempting 6 times.

## 2023-11-16 NOTE — ED Triage Notes (Signed)
 Pt states that she has a UTI that has been treated with abx twice. Infection is worsening. Pt states that she has been unable to urinate today and is having back pain. Pt immunocompromised.

## 2023-11-17 ENCOUNTER — Emergency Department (HOSPITAL_COMMUNITY)

## 2023-11-17 DIAGNOSIS — Z833 Family history of diabetes mellitus: Secondary | ICD-10-CM | POA: Diagnosis not present

## 2023-11-17 DIAGNOSIS — Z791 Long term (current) use of non-steroidal anti-inflammatories (NSAID): Secondary | ICD-10-CM | POA: Diagnosis not present

## 2023-11-17 DIAGNOSIS — G35D Multiple sclerosis, unspecified: Secondary | ICD-10-CM | POA: Diagnosis present

## 2023-11-17 DIAGNOSIS — Z885 Allergy status to narcotic agent status: Secondary | ICD-10-CM | POA: Diagnosis not present

## 2023-11-17 DIAGNOSIS — K219 Gastro-esophageal reflux disease without esophagitis: Secondary | ICD-10-CM | POA: Diagnosis present

## 2023-11-17 DIAGNOSIS — Z888 Allergy status to other drugs, medicaments and biological substances status: Secondary | ICD-10-CM | POA: Diagnosis not present

## 2023-11-17 DIAGNOSIS — N3000 Acute cystitis without hematuria: Secondary | ICD-10-CM | POA: Diagnosis present

## 2023-11-17 DIAGNOSIS — N201 Calculus of ureter: Secondary | ICD-10-CM | POA: Diagnosis not present

## 2023-11-17 DIAGNOSIS — Z79899 Other long term (current) drug therapy: Secondary | ICD-10-CM | POA: Diagnosis not present

## 2023-11-17 DIAGNOSIS — F419 Anxiety disorder, unspecified: Secondary | ICD-10-CM | POA: Diagnosis present

## 2023-11-17 DIAGNOSIS — N136 Pyonephrosis: Secondary | ICD-10-CM | POA: Diagnosis present

## 2023-11-17 DIAGNOSIS — Z9049 Acquired absence of other specified parts of digestive tract: Secondary | ICD-10-CM | POA: Diagnosis not present

## 2023-11-17 DIAGNOSIS — N202 Calculus of kidney with calculus of ureter: Secondary | ICD-10-CM | POA: Diagnosis present

## 2023-11-17 DIAGNOSIS — Z8249 Family history of ischemic heart disease and other diseases of the circulatory system: Secondary | ICD-10-CM | POA: Diagnosis not present

## 2023-11-17 LAB — URINALYSIS, ROUTINE W REFLEX MICROSCOPIC
Bilirubin Urine: NEGATIVE
Glucose, UA: 50 mg/dL — AB
Hgb urine dipstick: NEGATIVE
Ketones, ur: NEGATIVE mg/dL
Leukocytes,Ua: NEGATIVE
Nitrite: POSITIVE — AB
Protein, ur: 100 mg/dL — AB
Specific Gravity, Urine: 1.027 (ref 1.005–1.030)
pH: 6 (ref 5.0–8.0)

## 2023-11-17 LAB — HCG, SERUM, QUALITATIVE: Preg, Serum: NEGATIVE

## 2023-11-17 LAB — LACTIC ACID, PLASMA: Lactic Acid, Venous: 1.1 mmol/L (ref 0.5–1.9)

## 2023-11-17 MED ORDER — ONDANSETRON HCL 4 MG/2ML IJ SOLN
4.0000 mg | Freq: Once | INTRAMUSCULAR | Status: AC
  Administered 2023-11-17: 4 mg via INTRAVENOUS
  Filled 2023-11-17: qty 2

## 2023-11-17 MED ORDER — ONDANSETRON 4 MG PO TBDP
4.0000 mg | ORAL_TABLET | Freq: Three times a day (TID) | ORAL | Status: DC | PRN
  Administered 2023-11-17: 4 mg via ORAL
  Filled 2023-11-17: qty 1

## 2023-11-17 MED ORDER — FENTANYL CITRATE (PF) 50 MCG/ML IJ SOSY
50.0000 ug | PREFILLED_SYRINGE | Freq: Once | INTRAMUSCULAR | Status: AC
  Administered 2023-11-17: 50 ug via INTRAVENOUS
  Filled 2023-11-17: qty 1

## 2023-11-17 MED ORDER — ONDANSETRON 4 MG PO TBDP: 4.0000 mg | ORAL_TABLET | Freq: Three times a day (TID) | ORAL | 0 refills | Status: AC | PRN

## 2023-11-17 MED ORDER — TAMSULOSIN HCL 0.4 MG PO CAPS: 0.4000 mg | ORAL_CAPSULE | Freq: Every day | ORAL | 0 refills | Status: AC

## 2023-11-17 MED ORDER — OXYCODONE HCL 5 MG PO TABS: 5.0000 mg | ORAL_TABLET | Freq: Four times a day (QID) | ORAL | 0 refills | Status: AC | PRN

## 2023-11-17 MED ORDER — MELOXICAM 15 MG PO TABS: 15.0000 mg | ORAL_TABLET | Freq: Every day | ORAL | 0 refills | Status: AC

## 2023-11-17 MED ORDER — METOPROLOL TARTRATE 25 MG PO TABS
25.0000 mg | ORAL_TABLET | Freq: Two times a day (BID) | ORAL | Status: DC
  Administered 2023-11-17: 25 mg via ORAL
  Filled 2023-11-17: qty 1

## 2023-11-17 MED ORDER — ENOXAPARIN SODIUM 40 MG/0.4ML IJ SOSY
40.0000 mg | PREFILLED_SYRINGE | Freq: Every day | INTRAMUSCULAR | Status: DC
  Administered 2023-11-17: 40 mg via SUBCUTANEOUS
  Filled 2023-11-17: qty 0.4

## 2023-11-17 MED ORDER — SODIUM CHLORIDE 0.9 % IV SOLN
1.0000 g | Freq: Once | INTRAVENOUS | Status: AC
  Administered 2023-11-17: 1 g via INTRAVENOUS
  Filled 2023-11-17: qty 10

## 2023-11-17 MED ORDER — SODIUM CHLORIDE 0.9 % IV SOLN
12.5000 mg | Freq: Once | INTRAVENOUS | Status: AC
  Administered 2023-11-17: 12.5 mg via INTRAVENOUS
  Filled 2023-11-17: qty 0.5

## 2023-11-17 MED ORDER — ACETAMINOPHEN 500 MG PO TABS
1000.0000 mg | ORAL_TABLET | Freq: Four times a day (QID) | ORAL | 0 refills | Status: AC | PRN
Start: 2023-11-17 — End: 2023-11-22

## 2023-11-17 MED ORDER — FENTANYL CITRATE (PF) 50 MCG/ML IJ SOSY
50.0000 ug | PREFILLED_SYRINGE | Freq: Once | INTRAMUSCULAR | Status: DC
  Filled 2023-11-17: qty 1

## 2023-11-17 MED ORDER — OXYCODONE HCL 5 MG PO TABS
5.0000 mg | ORAL_TABLET | Freq: Four times a day (QID) | ORAL | Status: DC | PRN
  Administered 2023-11-17: 5 mg via ORAL
  Filled 2023-11-17: qty 1

## 2023-11-17 NOTE — Discharge Summary (Addendum)
 Physician Discharge Summary   Patient: Amy Rivas MRN: [id number] DOB: 1990-12-10  Admit date:     11/16/2023  Discharge date: 11/17/23  Discharge Physician: Marsha Ada   PCP: Jolee Madelin Patch, MD   Recommendations at discharge:   Medical Expulsive Therapy x 2-3 weeks Flomax 0.4mg , antinausea medication, high dose NSAIDs (mobic  vs PO toradol ), tylenol  Please provide urine strainer for each void Follow up with [facility] in ~1-2 weeks to establish care, discuss stone management and intervention if non-passage  Discharge Diagnoses: Principal Problem:   Acute cystitis Active Problems:   Ureteral stone  Resolved Problems:   * No resolved hospital problems. *  Hospital Course: Amy Rivas is a 33 y.o. year old  p/w acute R flank pain and found to have 3mm UVJ stone.   The patient reported experiencing pain, nausea, and vomiting starting Monday evening. The patient had an urgent care visit on Tuesday and was prescribed an antibiotic, which did not alleviate the symptoms. On Friday morning, the patient visited a primary care doctor and received a different antibiotic. The patient took four days of the first antibiotic and two doses of the second antibiotic before coming to the hospital. The patient had been using Tylenol  and naproxen at home for pain management and was given Toradol  at the hospital. Despite these efforts, the patient continued to experience significant pain, and presented to to the ED for evaluation.  In the ED, CT abd/pelvis showed 3mm R UVJ stone for which Urology was consulted and medical expulsive therapy was recommended.  Assessment and Plan:  3mm obstructive Right UVJ stone UA - likely contaminate with pyridium false positive Pain mild, fairly well controlled in ED - Medical Expulsive Therapy x 2-3 weeks - Flomax 0.4mg , antinausea medication, high dose NSAIDs (mobic  vs PO toradol ), tylenol  - Please provide urine strainer for each void -  Follow up with [facility] in ~1-2 weeks to establish care, discuss stone management and intervention if non-passage  Presumed acute cystitis -Patient advised to complete prior to admission Augmentin as prescribed     Consultants: Urology Procedures performed: N/A  Disposition: Home Diet recommendation:  Regular diet DISCHARGE MEDICATION: Allergies as of 11/17/2023       Reactions   Albuterol  Anaphylaxis   Hydrocodone  Anaphylaxis, Nausea And Vomiting   Codeine Nausea And Vomiting   Lortab [hydrocodone -acetaminophen ] Nausea And Vomiting   Patient can tolerate acetaminophen  solely   Morphine And Codeine Swelling, Rash   Tecfidera [dimethyl Fumarate] Hives, Swelling        Medication List     STOP taking these medications    ibuprofen  200 MG tablet Commonly known as: ADVIL    Indomethacin  20 MG Caps   ondansetron  4 MG tablet Commonly known as: ZOFRAN        TAKE these medications    acetaminophen  500 MG tablet Commonly known as: TYLENOL  Take 2 tablets (1,000 mg total) by mouth every 6 (six) hours as needed for up to 5 days for moderate pain (pain score 4-6).   dicyclomine  20 MG tablet Commonly known as: BENTYL  Take 1 tablet (20 mg total) by mouth 2 (two) times daily.   meloxicam  15 MG tablet Commonly known as: MOBIC  Take 1 tablet (15 mg total) by mouth daily for 5 days.   metoprolol  tartrate 25 MG tablet Commonly known as: LOPRESSOR  Take 25 mg by mouth 2 (two) times daily.   ocrelizumab 600 mg in sodium chloride  0.9 % 500 mL Inject 600 mg into the vein  every 6 (six) months. Last infusion 1st part of September 2018   ondansetron  4 MG disintegrating tablet Commonly known as: ZOFRAN -ODT Take 1 tablet (4 mg total) by mouth every 8 (eight) hours as needed for nausea or vomiting.   oxyCODONE 5 MG immediate release tablet Commonly known as: Oxy IR/ROXICODONE Take 1 tablet (5 mg total) by mouth every 6 (six) hours as needed for up to 5 days for severe pain  (pain score 7-10).   polyethylene glycol 17 g packet Commonly known as: MIRALAX  / GLYCOLAX  Take 17 g by mouth daily.   tamsulosin 0.4 MG Caps capsule Commonly known as: FLOMAX Take 1 capsule (0.4 mg total) by mouth daily.        Discharge Exam: Filed Weights   11/16/23 2259  Weight: 47.6 kg   General: Alert, oriented x3, resting comfortably in no acute distress Respiratory: Lungs clear to auscultation bilaterally with normal respiratory effort; no w/r/r Cardiovascular: Regular rate and rhythm w/o m/r/g   Condition at discharge: good  The results of significant diagnostics from this hospitalization (including imaging, microbiology, ancillary and laboratory) are listed below for reference.   Imaging Studies: CT Renal Stone Study Result Date: 11/17/2023 EXAM: CT ABDOMEN AND PELVIS WITHOUT CONTRAST 11/17/2023 01:43:12 AM TECHNIQUE: CT of the abdomen and pelvis was performed without the administration of intravenous contrast. Multiplanar reformatted images are provided for review. Automated exposure control, iterative reconstruction, and/or weight-based adjustment of the mA/kV was utilized to reduce the radiation dose to as low as reasonably achievable. COMPARISON: CT abdomen and pelvis with contrast 06/09/2022 and 12/25/2017. CLINICAL HISTORY: Abdominal/flank pain, stone suspected. FINDINGS: LOWER CHEST: No acute abnormality. LIVER: The liver is unremarkable. GALLBLADDER AND BILE DUCTS: Surgical absence as before. No biliary ductal dilatation. SPLEEN: No acute abnormality. PANCREAS: No acute abnormality. ADRENAL GLANDS: No adrenal mass. KIDNEYS, URETERS AND BLADDER: No contour deforming abnormality of the unenhanced kidneys. There are no intrarenal stones bilaterally. On the right, there is renal edema, perinephric stranding, peripelvic and periureteral stranding and moderate right hydroureteronephrosis due to a 3 mm ureterovesical junction stone. The bladder is contracted and not well  seen. Both ureters are otherwise clear. GI AND BOWEL: Stomach demonstrates no acute abnormality. There is no bowel obstruction. PERITONEUM AND RETROPERITONEUM: No ascites. No free air. VASCULATURE: Aorta is normal in caliber. LYMPH NODES: No lymphadenopathy. REPRODUCTIVE ORGANS: The uterus is absent. No adnexal mass is seen. Numerous pelvic phleboliths are present. BONES AND SOFT TISSUES: No acute osseous abnormality. No focal soft tissue abnormality. IMPRESSION: 1. Moderate right hydroureteronephrosis due to a 3 mm UVJ stone. 2. Right renal swelling and edema. Correlate clinically for infectious complication. 3. No calyceal stones either side. No other significant findings. Electronically signed by: Francis Quam MD 11/17/2023 02:11 AM EDT RP Workstation: [id number]    Microbiology: Results for orders placed or performed during the hospital encounter of 11/16/23  Blood culture (routine x 2)     Status: None (Preliminary result)   Collection Time: 11/17/23  2:53 AM   Specimen: BLOOD RIGHT HAND  Result Value Ref Range Status   Specimen Description BLOOD RIGHT HAND  Final   Special Requests   Final    BOTTLES DRAWN AEROBIC AND ANAEROBIC Blood Culture adequate volume   Culture   Final    NO GROWTH < 12 HOURS Performed at [facility], [address], [place], [place] [address]    Report Status PENDING  Incomplete  Blood culture (routine x 2)     Status:  None (Preliminary result)   Collection Time: 11/17/23  3:03 AM   Specimen: BLOOD LEFT HAND  Result Value Ref Range Status   Specimen Description BLOOD LEFT HAND  Final   Special Requests   Final    BOTTLES DRAWN AEROBIC AND ANAEROBIC Blood Culture adequate volume   Culture   Final    NO GROWTH < 12 HOURS Performed at [facility], [address], [place], [place] [address]    Report Status PENDING  Incomplete    Labs: CBC: Recent Labs  Lab 11/16/23 2323  WBC 10.4  HGB 15.7*  HCT 44.9  MCV 91.4  PLT 383   Basic  Metabolic Panel: Recent Labs  Lab 11/16/23 2323  NA 137  K 4.1  CL 102  CO2 25  GLUCOSE 88  BUN 17  CREATININE 0.97  CALCIUM 9.2   Liver Function Tests: Recent Labs  Lab 11/16/23 2323  AST 18  ALT 25  ALKPHOS 62  BILITOT 0.8  PROT 6.6  ALBUMIN 4.3   CBG: No results for input(s): GLUCAP in the last 168 hours.  Discharge time spent: greater than 30 minutes.  Signed: Marsha Ada, MD [facility] 11/17/2023

## 2023-11-17 NOTE — ED Notes (Signed)
 Pt actively vomiting. PRN meds given for pain and nausea. Per hospitalist, wait 30-45 minutes for PO meds to work before discharging.

## 2023-11-17 NOTE — ED Notes (Addendum)
 Patient is having another episode of vomiting.

## 2023-11-17 NOTE — ED Notes (Signed)
 PT is sitting on the side of the bed in obvious discomfort. PT is Aox4. Describes having pain in the lower part of her ABD that stays in the middle. Denied any radiation of pain. 5/10 on the pain scale. + N/V. Nausea meds initiated.

## 2023-11-17 NOTE — ED Notes (Signed)
 This nurse bladder scanned patient three times. Each time the bladder scan resulted with 0ml. EDP Rancour, MD made aware.

## 2023-11-17 NOTE — ED Notes (Signed)
 Calling out requesting meds for nausea and pain. No meds currently ordered PRN for nausea. Secure chat sent to hospitalist.

## 2023-11-17 NOTE — ED Notes (Signed)
 Patient experiencing another dose of emesis.

## 2023-11-17 NOTE — ED Provider Notes (Signed)
 [place] EMERGENCY DEPARTMENT AT [facility] Provider Note   CSN: [id number] Arrival date & time: 11/16/23  2227     Patient presents with: Urinary Retention   Amy Rivas is a 33 y.o. female.   Patient with a history of multiple sclerosis here with concern for UTI.  She was prescribed Macrobid at urgent care on 10/28.  She was changed to Bactrim yesterday due to recurrent symptoms.  Since this evening she feels like she is not able to urinate and has significant suprapubic pain and pressure with pain unable to urinate, no vaginal bleeding or discharge.  No vomiting.  No chest pain or shortness of breath.  No fever.  she reports no history of previous bladder problems.  No chest pain or shortness of breath.  She denies any back pain or flank pain.  She denies history of bladder problems in the past from her mass.  [States she feels like she cannot urinate but she has no urinary retention on bladder scan.  Was on bactrim 10/28 and changed to Macrobid on October 31. She is also been taking Pyridium  The history is provided by the patient.       Prior to Admission medications   Medication Sig Start Date End Date Taking? Authorizing Provider  dicyclomine  (BENTYL ) 20 MG tablet Take 1 tablet (20 mg total) by mouth 2 (two) times daily. 06/09/22   Prosperi, Christian H, PA-C  ibuprofen  (ADVIL ,MOTRIN ) 200 MG tablet Take 600 mg by mouth every 8 (eight) hours as needed (pain).     [provider]  Indomethacin  20 MG CAPS Take 25 mg 3 (three) times daily with meals by mouth. 11/20/16   Sater, Charlie LABOR, MD  meloxicam  (MOBIC ) 15 MG tablet Take 1 tablet (15 mg total) by mouth daily. 11/17/16   Sater, Charlie LABOR, MD  metoprolol  tartrate (LOPRESSOR ) 25 MG tablet Take 25 mg by mouth 2 (two) times daily. 05/03/15   [provider]  ocrelizumab 600 mg in sodium chloride  0.9 % 500 mL Inject 600 mg into the vein every 6 (six) months. Last infusion 1st part of September 2018     [provider]  ondansetron  (ZOFRAN ) 4 MG tablet Take 1 tablet (4 mg total) by mouth every 6 (six) hours as needed for nausea or vomiting. 06/09/22   Prosperi, Christian H, PA-C  polyethylene glycol (MIRALAX  / GLYCOLAX ) packet Take 17 g by mouth daily. 12/25/17   Caccavale, Sophia, PA-C    Allergies: Albuterol , Hydrocodone , Codeine, Lortab [hydrocodone -acetaminophen ], Morphine and codeine, and Tecfidera [dimethyl fumarate]    Review of Systems  Constitutional:  Negative for activity change, appetite change and fever.  HENT:  Negative for congestion and rhinorrhea.   Respiratory:  Negative for cough, chest tightness and shortness of breath.   Cardiovascular:  Negative for chest pain.  Gastrointestinal:  Negative for abdominal pain, diarrhea, nausea and vomiting.  Genitourinary:  Positive for decreased urine volume, difficulty urinating and dysuria. Negative for hematuria.  Musculoskeletal:  Negative for arthralgias and myalgias.  Skin:  Negative for rash.  Neurological:  Negative for dizziness, weakness and headaches.   all other systems are negative except as noted in the HPI and PMH.    Updated Vital Signs BP (!) 162/104 (BP Location: Left Arm)   Pulse 92   Temp 98.2 F (36.8 C) (Oral)   Resp 20   Ht 4' 8 (1.422 m)   Wt 47.6 kg   LMP 12/19/2017   SpO2 95%  BMI 23.53 kg/m   Physical Exam Vitals and nursing note reviewed.  Constitutional:      General: She is not in acute distress.    Appearance: Normal appearance. She is well-developed. She is not ill-appearing.  HENT:     Head: Normocephalic and atraumatic.     Mouth/Throat:     Pharynx: No oropharyngeal exudate.  Eyes:     Conjunctiva/sclera: Conjunctivae normal.     Pupils: Pupils are equal, round, and reactive to light.  Neck:     Comments: No meningismus. Cardiovascular:     Rate and Rhythm: Normal rate and regular rhythm.     Heart sounds: Normal heart sounds. No murmur heard. Pulmonary:      Effort: Pulmonary effort is normal. No respiratory distress.     Breath sounds: Normal breath sounds.  Abdominal:     Palpations: Abdomen is soft.     Tenderness: There is no abdominal tenderness. There is no guarding or rebound.     Comments: Suprapubic tenderness  Musculoskeletal:        General: No tenderness. Normal range of motion.     Cervical back: Normal range of motion and neck supple.     Comments: No CVAT  Skin:    General: Skin is warm.  Neurological:     Mental Status: She is alert and oriented to person, place, and time.     Cranial Nerves: No cranial nerve deficit.     Motor: No abnormal muscle tone.     Coordination: Coordination normal.     Comments:  5/5 strength throughout. CN 2-12 intact.Equal grip strength.   Psychiatric:        Behavior: Behavior normal.     (all labs ordered are listed, but only abnormal results are displayed) Labs Reviewed  CBC - Abnormal; Notable for the following components:      Result Value   Hemoglobin 15.7 (*)    RDW 11.3 (*)    All other components within normal limits  URINALYSIS, ROUTINE W REFLEX MICROSCOPIC - Abnormal; Notable for the following components:   Color, Urine AMBER (*)    Glucose, UA 50 (*)    Protein, ur 100 (*)    Nitrite POSITIVE (*)    Bacteria, UA MANY (*)    All other components within normal limits  URINE CULTURE  CULTURE, BLOOD (ROUTINE X 2)  CULTURE, BLOOD (ROUTINE X 2)  LIPASE, BLOOD  COMPREHENSIVE METABOLIC PANEL WITH GFR  HCG, SERUM, QUALITATIVE  LACTIC ACID, PLASMA    EKG: None  Radiology: CT Renal Stone Study Result Date: 11/17/2023 EXAM: CT ABDOMEN AND PELVIS WITHOUT CONTRAST 11/17/2023 01:43:12 AM TECHNIQUE: CT of the abdomen and pelvis was performed without the administration of intravenous contrast. Multiplanar reformatted images are provided for review. Automated exposure control, iterative reconstruction, and/or weight-based adjustment of the mA/kV was utilized to reduce the radiation  dose to as low as reasonably achievable. COMPARISON: CT abdomen and pelvis with contrast 06/09/2022 and 12/25/2017. CLINICAL HISTORY: Abdominal/flank pain, stone suspected. FINDINGS: LOWER CHEST: No acute abnormality. LIVER: The liver is unremarkable. GALLBLADDER AND BILE DUCTS: Surgical absence as before. No biliary ductal dilatation. SPLEEN: No acute abnormality. PANCREAS: No acute abnormality. ADRENAL GLANDS: No adrenal mass. KIDNEYS, URETERS AND BLADDER: No contour deforming abnormality of the unenhanced kidneys. There are no intrarenal stones bilaterally. On the right, there is renal edema, perinephric stranding, peripelvic and periureteral stranding and moderate right hydroureteronephrosis due to a 3 mm ureterovesical junction stone. The bladder is contracted  and not well seen. Both ureters are otherwise clear. GI AND BOWEL: Stomach demonstrates no acute abnormality. There is no bowel obstruction. PERITONEUM AND RETROPERITONEUM: No ascites. No free air. VASCULATURE: Aorta is normal in caliber. LYMPH NODES: No lymphadenopathy. REPRODUCTIVE ORGANS: The uterus is absent. No adnexal mass is seen. Numerous pelvic phleboliths are present. BONES AND SOFT TISSUES: No acute osseous abnormality. No focal soft tissue abnormality. IMPRESSION: 1. Moderate right hydroureteronephrosis due to a 3 mm UVJ stone. 2. Right renal swelling and edema. Correlate clinically for infectious complication. 3. No calyceal stones either side. No other significant findings. Electronically signed by: Francis Quam MD 11/17/2023 02:11 AM EDT RP Workstation: [id number]     Procedures   Medications Ordered in the ED  ketorolac  (TORADOL ) 15 MG/ML injection 15 mg (has no administration in time range)  fentaNYL  (SUBLIMAZE ) injection 50 mcg (has no administration in time range)  ondansetron  (ZOFRAN ) injection 4 mg (has no administration in time range)                                    Medical Decision Making Amount and/or Complexity  of Data Reviewed Labs: ordered. Radiology: ordered.  Risk Prescription drug management. Decision regarding hospitalization.   Patient here with concern for UTI.  She was prescribed Macrobid at urgent care.  She states she has not been able to urinate for the past 5 days and has suprapubic pain and pressure.  No vaginal bleeding or discharge.  No fever or vomiting.  Hypertensive on arrival.  Bladder scan shows 0 mL of urine.  Urinalysis is positive for infection with positive nitrate and leukocytes and bacteria.  Creatinine is normal.  No leukocytosis.  No fever.  CT scan is obtained given her persistent symptoms with flank pain and lower abdominal pain, urinary urgency and frequency.  This is positive for a UVJ stone on the right that is 3mm. R renal edema and hydronephrosis.   Does not appear to be toxic or septic but is quite uncomfortable.  Will discuss with urology.  IV Rocephin  given.  Urine cultures and blood cultures obtained.  Afebrile in the ED.  No significant leukocytosis.  Creatinine is normal.  CT with obstruction and UVJ stone of 3 mm hydronephrosis. D/w Dr. Merdis  of urology  He does not feel she needs an emergent stent.  She feels urine is likely contaminated.  Agrees with medical admission for symptom control.  IV Rocephin  given and urine and blood cultures obtained.  Feels like she is stable to stay at [facility].  Plan to admit for IV antibiotics and recurrent symptoms.  Lactic acid is normal. Blood and urine cultures are pending.  Urology does not feel she needs to be transferred to [facility] does not feel like she needs an emergent stent.  Admission discussed with Dr. Alfornia.     Final diagnoses:  Ureteral stone  Pyelonephritis    ED Discharge Orders     None          Jeter Tomey, Garnette, MD 11/17/23 [phone]

## 2023-11-17 NOTE — ED Notes (Signed)
 Pt is feeling better after PO meds and is comfortable with discharge plan. AVS with prescriptions provided to and discussed with patient and spouse at bedside. Pt verbalizes understanding of discharge instructions and denies any questions or concerns at this time. Pt has ride home. Pt ambulated out of department independently with steady gait.

## 2023-11-17 NOTE — Consult Note (Addendum)
 Urology Consult   I have been asked to see the patient by Dr. Carita, for evaluation and management of nephrolithiasis.  Chief Complaint: acute Right flank pain  HPI:  Amy Rivas is a 33 y.o. year old  ED Visit 11/16/23 - acute Right flank pain, was at [facility] a couple of days ago for same   - Cr 0.97, WBC 10   - UA positive nitrite only, 0-5 WBC, >11 squam, many bacteria   - CT stone - 3mm Right UVJ stone with proximal hydroureteronephrosis    - She has been taking Pyridium x 3 days since urgent care  On my visit, patient resting comfortable upright in bed. Husband at bedside Mild pain at most the last several hours, some ongoing mild nausea Symptoms responding well to medical therapy No prior history of stones No prior GU conditions or surgeries  PMH: Past Medical History:  Diagnosis Date   Anxiety    Elevated LFTs    GERD (gastroesophageal reflux disease)    Headache    Hemangioma    as a child; had liver biopsy but unsure of results   MS (multiple sclerosis) 05/2015   Neuromuscular disorder (HCC)    MS   Ovarian cyst    Post lumbar puncture headache 11/07/2016   PSVT (paroxysmal supraventricular tachycardia)    Vitamin D  deficiency     Surgical History: Past Surgical History:  Procedure Laterality Date   CESAREAN SECTION  2010, 2012   LAPAROSCOPIC CHOLECYSTECTOMY  2008   TONSILLECTOMY AND ADENOIDECTOMY  1992   TYMPANOSTOMY TUBE PLACEMENT  1991, 1993, 2003    Allergies:  Allergies  Allergen Reactions   Albuterol  Anaphylaxis   Hydrocodone  Anaphylaxis and Nausea And Vomiting   Codeine Nausea And Vomiting   Lortab [Hydrocodone -Acetaminophen ] Nausea And Vomiting    Patient can tolerate acetaminophen  solely   Morphine And Codeine Swelling and Rash   Tecfidera [Dimethyl Fumarate] Hives and Swelling    Family History: Family History  Problem Relation Age of Onset   Heart disease Mother    Diabetes Father    Lung cancer Maternal Grandmother     Diabetes Maternal Grandmother    Asthma Maternal Grandmother    Breast cancer Maternal Grandmother    Cancer Maternal Uncle        type unknown   Diabetes Maternal Grandfather    Diabetes Paternal Grandmother    Diabetes Paternal Grandfather    Healthy Sister     Social History:  reports that she has never smoked. She has never used smokeless tobacco. She reports that she does not drink alcohol and does not use drugs.  ROS: Negative aside from those stated in the HPI.  Physical Exam: BP 120/84   Pulse 85   Temp 98.2 F (36.8 C) (Oral)   Resp 16   Ht 4' 8 (1.422 m)   Wt 47.6 kg   LMP 12/19/2017   SpO2 100%   BMI 23.53 kg/m    Constitutional:  Alert and oriented, No acute distress. Cardiovascular: No clubbing, cyanosis, or edema. Respiratory: Normal respiratory effort, no increased work of breathing. GI: Abdomen is soft, nontender, nondistended, no abdominal masses Lymph: No cervical or inguinal lymphadenopathy. Skin: No rashes, bruises or suspicious lesions. Neurologic: Grossly intact, no focal deficits, moving all 4 extremities. Psychiatric: Normal mood and affect.   Pertinent Imaging: I have personally reviewed the CT stone (11/16/23)- agree with read. 3mm Right UVJ stone with proximal hydroureteronephrosis. No other renal stones bilaterally.  Contralateral Left kidney morphologically normal  Assessment & Plan:   3mm obstructive Right UVJ stone UA - likely contaminate with pyridium false positive Pain mild, fairly well controlled in ED  I had a thoughtful conversation with her and her husband. I do think she would be fine to discharge considering her current well controlled symptoms. She has a high likelihood of spontaneous stone passage with her stone size and current position. I reviewed the basic tenets of MET, return precautions, and the need for close follow up in the Urology clinic to confirm passage in the next ~2-3 weeks. Lastly, I reviewed the alternative for  refractory pain, symptoms, and/or infection- which is ureteral stent placement in the OR for decompression. This would also necessitate a 2nd procedure to definitively treat her stone. Ultimately, patient and husband wished to discharge and felt comfortable on MET.   Recommendations: - MET x 2-3 weeks - Flomax 0.4mg , antinausea medication, high dose NSAIDs (mobic  vs PO toradol ), tylenol  - Please provide urine strainer for each void - Follow up with [facility] in ~1-2 weeks to establish care, discuss stone management and intervention if non-passage  Penne JONELLE Skye, MD   [facility] [address], [address] [place], [place] [address] [phone]

## 2023-11-22 LAB — CULTURE, BLOOD (ROUTINE X 2)
Culture: NO GROWTH
Culture: NO GROWTH
Special Requests: ADEQUATE
Special Requests: ADEQUATE
